# Patient Record
Sex: Male | Born: 1950
Health system: Southern US, Community
[De-identification: ages and names within clinical notes are randomized; demographics above are authoritative.]

## PROBLEM LIST (undated history)

## (undated) DIAGNOSIS — Z9989 Dependence on other enabling machines and devices: Secondary | ICD-10-CM

## (undated) DIAGNOSIS — E785 Hyperlipidemia, unspecified: Secondary | ICD-10-CM

## (undated) DIAGNOSIS — B269 Mumps without complication: Secondary | ICD-10-CM

## (undated) DIAGNOSIS — M1990 Unspecified osteoarthritis, unspecified site: Secondary | ICD-10-CM

## (undated) DIAGNOSIS — H269 Unspecified cataract: Secondary | ICD-10-CM

## (undated) DIAGNOSIS — Z9189 Other specified personal risk factors, not elsewhere classified: Secondary | ICD-10-CM

## (undated) DIAGNOSIS — J309 Allergic rhinitis, unspecified: Secondary | ICD-10-CM

## (undated) DIAGNOSIS — G3184 Mild cognitive impairment, so stated: Secondary | ICD-10-CM

## (undated) DIAGNOSIS — T7840XA Allergy, unspecified, initial encounter: Secondary | ICD-10-CM

## (undated) DIAGNOSIS — E119 Type 2 diabetes mellitus without complications: Secondary | ICD-10-CM

## (undated) DIAGNOSIS — Z8619 Personal history of other infectious and parasitic diseases: Secondary | ICD-10-CM

## (undated) DIAGNOSIS — I10 Essential (primary) hypertension: Secondary | ICD-10-CM

## (undated) HISTORY — PX: WISDOM TOOTH EXTRACTION: SHX21

## (undated) HISTORY — DX: Mumps without complication: B26.9

## (undated) HISTORY — DX: Personal history of other infectious and parasitic diseases: Z86.19

## (undated) HISTORY — DX: Hyperlipidemia, unspecified: E78.5

## (undated) HISTORY — DX: Essential (primary) hypertension: I10

## (undated) HISTORY — DX: Dependence on other enabling machines and devices: Z99.89

## (undated) HISTORY — DX: Allergic rhinitis, unspecified: J30.9

## (undated) HISTORY — DX: Mild cognitive impairment of uncertain or unknown etiology: G31.84

## (undated) HISTORY — DX: Unspecified cataract: H26.9

## (undated) HISTORY — DX: Unspecified osteoarthritis, unspecified site: M19.90

## (undated) HISTORY — DX: Allergy, unspecified, initial encounter: T78.40XA

## (undated) HISTORY — PX: HERNIA REPAIR: SHX51

## (undated) HISTORY — DX: Other specified personal risk factors, not elsewhere classified: Z91.89

---

## 2009-05-26 ENCOUNTER — Emergency Department (HOSPITAL_COMMUNITY): Admission: EM | Admit: 2009-05-26 | Discharge: 2009-05-26 | Payer: Self-pay | Admitting: Emergency Medicine

## 2009-06-16 ENCOUNTER — Emergency Department (HOSPITAL_COMMUNITY): Admission: EM | Admit: 2009-06-16 | Discharge: 2009-06-16 | Payer: Self-pay | Admitting: Emergency Medicine

## 2009-08-29 ENCOUNTER — Encounter: Admission: RE | Admit: 2009-08-29 | Discharge: 2009-08-29 | Payer: Self-pay | Admitting: Family Medicine

## 2009-10-08 ENCOUNTER — Inpatient Hospital Stay (HOSPITAL_COMMUNITY): Admission: EM | Admit: 2009-10-08 | Discharge: 2009-10-18 | Payer: Self-pay | Admitting: Emergency Medicine

## 2009-10-08 DIAGNOSIS — E119 Type 2 diabetes mellitus without complications: Secondary | ICD-10-CM

## 2009-10-08 DIAGNOSIS — N179 Acute kidney failure, unspecified: Secondary | ICD-10-CM

## 2009-10-08 LAB — CONVERTED CEMR LAB: Hgb A1c MFr Bld: 13.3 %

## 2009-10-09 LAB — CONVERTED CEMR LAB
Cholesterol: 228 mg/dL
LDL Cholesterol: 147 mg/dL
Total CHOL/HDL Ratio: 7.6
Triglycerides: 254 mg/dL
VLDL: 51 mg/dL

## 2009-10-10 LAB — CONVERTED CEMR LAB
HCT: 39.7 %
Hemoglobin: 12.7 g/dL
MCHC: 32 g/dL

## 2009-10-12 LAB — CONVERTED CEMR LAB
Calcium: 8.5 mg/dL
Chloride: 108 meq/L
Creatinine, Ser: 1.16 mg/dL
Potassium: 3.4 meq/L

## 2009-11-11 ENCOUNTER — Ambulatory Visit: Payer: Self-pay | Admitting: Nurse Practitioner

## 2009-11-11 DIAGNOSIS — E785 Hyperlipidemia, unspecified: Secondary | ICD-10-CM

## 2009-11-11 DIAGNOSIS — R351 Nocturia: Secondary | ICD-10-CM

## 2009-11-11 DIAGNOSIS — J309 Allergic rhinitis, unspecified: Secondary | ICD-10-CM | POA: Insufficient documentation

## 2009-11-11 DIAGNOSIS — K5900 Constipation, unspecified: Secondary | ICD-10-CM | POA: Insufficient documentation

## 2009-11-11 DIAGNOSIS — N429 Disorder of prostate, unspecified: Secondary | ICD-10-CM | POA: Insufficient documentation

## 2009-11-11 LAB — CONVERTED CEMR LAB
ALT: 30 units/L (ref 0–53)
AST: 21 units/L (ref 0–37)
Alkaline Phosphatase: 68 units/L (ref 39–117)
Cholesterol, target level: 200 mg/dL
Glucose, Bld: 87 mg/dL (ref 70–99)
HDL goal, serum: 40 mg/dL
PSA: 0.81 ng/mL (ref 0.10–4.00)
Total Bilirubin: 0.3 mg/dL (ref 0.3–1.2)
Total Protein: 7 g/dL (ref 6.0–8.3)

## 2009-11-12 ENCOUNTER — Encounter (INDEPENDENT_AMBULATORY_CARE_PROVIDER_SITE_OTHER): Payer: Self-pay | Admitting: Nurse Practitioner

## 2010-03-22 ENCOUNTER — Encounter: Payer: Self-pay | Admitting: Family Medicine

## 2010-03-31 NOTE — Assessment & Plan Note (Signed)
Summary: NEW - Establish Care   Vital Signs:  Patient profile:   60 year old male Height:      69.50 inches Weight:      200.8 pounds BMI:     29.33 Temp:     97.6 degrees F oral Pulse rate:   72 / minute Pulse rhythm:   regular Resp:     16 per minute BP sitting:   138 / 80  (left arm) Cuff size:   regular  Vitals Entered By: Levon Hedger (November 11, 2009 10:18 AM)  Nutrition Counseling: Patient's BMI is greater than 25 and therefore counseled on weight management options. CC: hospital f/u DM, Lipid Management Is Patient Diabetic? Yes Pain Assessment Patient in pain? no      CBG Result 118 CBG Device ID B  Does patient need assistance? Functional Status Self care Ambulation Normal   CC:  hospital f/u DM and Lipid Management.  History of Present Illness:  Pt into the office to establish care. Pt was previously seen at [facility] - Dr. Daphine Deutscher.  Pt did not want to transfer from that practice however he was given an appt in this office for hospital f/u so he came today as scheduled  Hospitalized from 10/08/2009 to 10/13/2009  Pt presented to the ER with nausea, vomiting, abdominal pain, polyuria and polydipsia. Pt was seen by his PCP and his blood sugar was over 700 ad he was told to go to the hospital for further workup.    1. Diabetes - pt as put on an insulin drip per DKA protocol  Sugars improved and dehydration improved.  He was seen by the diabetes coordinator and his insulin was adjusted to achieve good control.  Hbga1c = 13.3.  Pt was started on insulin.  Pt has been taking 70/30 - 15 units subcutaneously two times a day.  2.  ARF - likely from dehydration.  Pt was hydrated and crt and sodium normal at d/c.   Renal ultrasound was negative for hydronephrosis.  3.  Leukocytosis with hydration - resolved at d/c.  he did not have any evidence of infection  4.  Dyslipidemia - pt started on a statin.    Diabetes Management History:      The  patient is a 60 years old male who comes in for evaluation of DM Type 2.  He has not been enrolled in the "Diabetic Education Program".  No sensory loss is reported.  Self foot exams are not being performed.  He is checking home blood sugars.        Hypoglycemic symptoms are not occurring.  No hyperglycemic symptoms are reported.  Other comments include: pt is checking his blood sugars 2-3 times per day and presents today with the blood sugar log.        The following changes have been made to his treatment plan since last visit: insulin dosing.  Treatment plan changes were initiated by MD.    Lipid Management History:      Positive NCEP/ATP III risk factors include male age 63 years old or older, diabetes, and HDL cholesterol less than 40.  Negative NCEP/ATP III risk factors include non-tobacco-user status.        The patient states that he knows about the "Therapeutic Lifestyle Change" diet.  His compliance with the TLC diet is fair.  The patient does not know about adjunctive measures for cholesterol lowering.  Adjunctive measures started by the patient include ASA.  He expresses no  side effects from his lipid-lowering medication.  The patient denies any symptoms to suggest myopathy or liver disease.     Habits & Providers  Alcohol-Tobacco-Diet     Alcohol drinks/day: <1     Alcohol Counseling: not indicated; use of alcohol is not excessive or problematic     Tobacco Status: quit     Tobacco Counseling: to remain off tobacco products     Year Quit: 2011  Exercise-Depression-Behavior     Have you felt down or hopeless? no     Have you felt little pleasure in things? no     Depression Counseling: not indicated; screening negative for depression     Drug Use: past  Allergies (verified): No Known Drug Allergies  Past History:  Past Surgical History: inguinal hernia repair  Family History: mother - diabetes father - unknown medical history brother - ? bone cancer, diabetes  Social  History: 6 children tobacco - quit in 2011 ETOH -  Drug - Smoking Status:  quit Drug Use:  past  Diabetic Foot Exam Foot Inspection Is there a history of a foot ulcer?              No Is there a foot ulcer now?              No Can the patient see the bottom of their feet?          No Are the shoes appropriate in style and fit?          Yes Is there swelling or an abnormal foot shape?          No Are the toenails long?                Yes Are the toenails thick?                Yes Are the toenails ingrown?              No Is there heavy callous build-up?              No Is there pain in the calf muscle (Intermittent claudication) when walking?    NoIs there a claw toe deformity?              No Is there elevated skin temperature?            No Is there limited ankle dorsiflexion?            No Is there foot or ankle muscle weakness?            No  Diabetic Foot Care Education Pulse Check          Right Foot          Left Foot Dorsalis Pedis:        normal            normal    10-g (5.07) Semmes-Weinstein Monofilament Test           Right Foot          Left Foot Visual Inspection                Review of Systems CV:  Denies chest pain or discomfort. Resp:  Denies cough. GI:  Complains of constipation; denies abdominal pain, nausea, and vomiting.  Physical Exam  General:  alert.   Head:  normocephalic.   Lungs:  normal breath sounds.   Heart:  normal rate and regular rhythm.   Abdomen:  normal bowel sounds.   Neurologic:  rigid gait cane use Psych:  Oriented X3.    Diabetes Management Exam:    Foot Exam (with socks and/or shoes not present):       Sensory-Monofilament:          Left foot: normal          Right foot: normal       Nails:          Left foot: thickened          Right foot: thickened   Impression & Recommendations:  Problem # 1:  DIABETES MELLITUS, TYPE II (ICD-250.00) new onset but pt was given diabetes education in the hospital and has really  grasped  he asked very appropriate questions today r/t diet  pneumovax given today His updated medication list for this problem includes:    Aspir-low 81 Mg Tbec (Aspirin) ..... One tablet by mouth daily    Novolog Mix 70/30 70-30 % Susp (Insulin aspart prot & aspart) .Marland KitchenMarland KitchenMarland KitchenMarland Kitchen 15 units subcutaneously two times a day for diabetes  Orders: Capillary Blood Glucose/CBG ([address]) T-Comprehensive Metabolic Panel (60454-09811)  Problem # 2:  DYSLIPIDEMIA (ICD-272.4) continue statin as started in the hospital His updated medication list for this problem includes:    Simvastatin 20 Mg Tabs (Simvastatin) ..... One tablet by mouth nightly for cholesterol  Orders: T-Comprehensive Metabolic Panel ([id number])  Problem # 3:  ALLERGIC RHINITIS (ICD-477.9)  His updated medication list for this problem includes:    Loratadine 10 Mg Tabs (Loratadine) ..... One tablet by mouth daily for allergies  Problem # 4:  RENAL FAILURE, ACUTE (ICD-584.9) will check labs today Orders: T-PSA ([id number]) T-Comprehensive Metabolic Panel (13086-57846)  Problem # 5:  UNSPECIFIED DISORDER OF PROSTATE (ICD-602.9) pt reports ? procedure many years ago on prostate unable to void today in office  Orders: T-PSA (96295-28413)  Problem # 6:  CONSTIPATION (ICD-564.00) reviewed with pt that he needs a high fiber diet His updated medication list for this problem includes:    Miralax Powd (Polyethylene glycol 3350) .Marland KitchenMarland KitchenMarland KitchenMarland Kitchen 17gm mixed with 8 ounces of water daily for stools  Complete Medication List: 1)  Aspir-low 81 Mg Tbec (Aspirin) .... One tablet by mouth daily 2)  Novolog Mix 70/30 70-30 % Susp (Insulin aspart prot & aspart) .Marland Kitchen.. 15 units subcutaneously two times a day for diabetes 3)  Miralax Powd (Polyethylene glycol 3350) .Marland Kitchen.. 17gm mixed with 8 ounces of water daily for stools 4)  Loratadine 10 Mg Tabs (Loratadine) .... One tablet by mouth daily for allergies 5)  Simvastatin 20 Mg Tabs (Simvastatin) .... One tablet  by mouth nightly for cholesterol  Other Orders: Pneumococcal Vaccine (24401) Admin 1st Vaccine (02725) Admin 1st Vaccine [facility]) [phone])  Diabetes Management Assessment/Plan:      The following lipid goals have been established for the patient: Total cholesterol goal of 200; LDL cholesterol goal of 100; HDL cholesterol goal of 40; Triglyceride goal of 150.  His blood pressure goal is < 130/80.    Lipid Assessment/Plan:      Based on NCEP/ATP III, the patient's risk factor category is "history of diabetes".  The patient's lipid goals are as follows: Total cholesterol goal is 200; LDL cholesterol goal is 100; HDL cholesterol goal is 40; Triglyceride goal is 150.  His LDL cholesterol goal has not been met.    Patient Instructions: 1)  Since you are already established at [facility] you can continue to go there if you like. 2)  Diabetes - Continue current medications - Insulin 70/30: 15 units subcutaneously two times a day.  3)  Your labs will be checked today and you will be notified of the results. 4)  You have received your pneumovax today. 5)  If you continue to come to [facility] call to make a follow up appointment in 4 weeks for diabetes. 6)  Flu vaccines should be in then. Prescriptions: MIRALAX  POWD (POLYETHYLENE GLYCOL 3350) 17gm mixed with 8 ounces of water daily for stools  #1 month qs x 1   Entered and Authorized by:   Lehman Prom FNP   Signed by:   Lehman Prom FNP on 11/11/2009   Method used:   Print then Give to Patient   RxID:   [id number]          Diabetic Foot Exam Pulse Check          Right Foot          Left Foot Dorsalis Pedis:        normal            normal    10-g (5.07) Semmes-Weinstein Monofilament Test           Right Foot          Left Foot Visual Inspection               Test Control      normal         normal Site 1         normal         normal Site 2         normal         normal Site 3         normal          normal Site 4         normal         normal Site 5         normal         normal Site 6         normal         normal Site 7         normal         normal Site 8         normal         normal Site 9         normal         normal Site 10         normal         normal  Impression      normal         normal   Laboratory Results   Blood Tests     CBG Random:: 118mg /dL       CXR  Procedure date:  10/08/2009  Findings:      linear left lower lobe probable scarring or atelectasis with no other acute abnormalities  X-ray  Procedure date:  10/08/2009  Findings:      KUB - nonobstructive bowel gas pattern  Renal US  Procedure date:  10/08/2009  Findings:      no hydronephrosis   CXR  Procedure date:  10/08/2009  Findings:      linear left lower lobe probable scarring or atelectasis with no other acute abnormalities  X-ray  Procedure date:  10/08/2009  Findings:      KUB - nonobstructive  bowel gas pattern  Renal US  Procedure date:  10/08/2009  Findings:      no hydronephrosis   Pneumovax Vaccine    Vaccine Type: Pneumovax    Site: right deltoid    Mfr: Merck    Dose: 0.5 ml    Route: IM    Given by: Levon Hedger    Exp. Date: 03/22/2011    Lot #: [id number]    VIS given: 02/03/09 version given November 11, 2009.    NDC  2956-2130-86

## 2010-03-31 NOTE — Letter (Signed)
Summary: *HSN Results Follow up  [facility]  [address] [place], [place] [address]   Phone: [phone]  Fax: [phone]      11/12/2009   Crecencio Mc [address] [place], [place]  [address]   Dear  Mr. RIGO LETTS,                            ____S.Drinkard,FNP   ____D. Gore,FNP       ____B. McPherson,MD   ____V. Rankins,MD    ____E. Mulberry,MD    _X___N. Daphine Deutscher, FNP  ____D. Reche Dixon, MD    ____K. Philipp Deputy, MD    ____Other     This letter is to inform you that your recent test(s):  _______Pap Smear    ___X____Lab Test     _______X-ray    ___X____ is within acceptable limits  _______ requires a medication change  _______ requires a follow-up lab visit  _______ requires a follow-up visit with your provider   Comments: Labs done during recent office visit are normal.       _________________________________________________________ If you have any questions, please contact our office [phone].                    Sincerely,    Lehman Prom FNP [facility]

## 2010-03-31 NOTE — Letter (Signed)
Summary: PT  INFORMATION SHEET  PT  INFORMATION SHEET   Imported By: Arta Bruce 11/11/2009 14:16:25  _____________________________________________________________________  External Attachment:    Type:   Image     Comment:   External Document

## 2010-05-15 LAB — GLUCOSE, CAPILLARY
Glucose-Capillary: 112 mg/dL — ABNORMAL HIGH (ref 70–99)
Glucose-Capillary: 143 mg/dL — ABNORMAL HIGH (ref 70–99)
Glucose-Capillary: 154 mg/dL — ABNORMAL HIGH (ref 70–99)
Glucose-Capillary: 159 mg/dL — ABNORMAL HIGH (ref 70–99)
Glucose-Capillary: 161 mg/dL — ABNORMAL HIGH (ref 70–99)
Glucose-Capillary: 162 mg/dL — ABNORMAL HIGH (ref 70–99)
Glucose-Capillary: 172 mg/dL — ABNORMAL HIGH (ref 70–99)
Glucose-Capillary: 177 mg/dL — ABNORMAL HIGH (ref 70–99)
Glucose-Capillary: 196 mg/dL — ABNORMAL HIGH (ref 70–99)
Glucose-Capillary: 202 mg/dL — ABNORMAL HIGH (ref 70–99)
Glucose-Capillary: 206 mg/dL — ABNORMAL HIGH (ref 70–99)
Glucose-Capillary: 224 mg/dL — ABNORMAL HIGH (ref 70–99)
Glucose-Capillary: 226 mg/dL — ABNORMAL HIGH (ref 70–99)
Glucose-Capillary: 237 mg/dL — ABNORMAL HIGH (ref 70–99)
Glucose-Capillary: 238 mg/dL — ABNORMAL HIGH (ref 70–99)
Glucose-Capillary: 68 mg/dL — ABNORMAL LOW (ref 70–99)
Glucose-Capillary: 98 mg/dL (ref 70–99)
Glucose-Capillary: 109 mg/dL — ABNORMAL HIGH (ref 70–99)
Glucose-Capillary: 127 mg/dL — ABNORMAL HIGH (ref 70–99)
Glucose-Capillary: 158 mg/dL — ABNORMAL HIGH (ref 70–99)
Glucose-Capillary: 169 mg/dL — ABNORMAL HIGH (ref 70–99)
Glucose-Capillary: 227 mg/dL — ABNORMAL HIGH (ref 70–99)
Glucose-Capillary: 327 mg/dL — ABNORMAL HIGH (ref 70–99)
Glucose-Capillary: 329 mg/dL — ABNORMAL HIGH (ref 70–99)
Glucose-Capillary: 417 mg/dL — ABNORMAL HIGH (ref 70–99)
Glucose-Capillary: 576 mg/dL (ref 70–99)
Glucose-Capillary: 76 mg/dL (ref 70–99)
Glucose-Capillary: >600 mg/dL (ref 70–99)
Glucose-Capillary: >600 mg/dL (ref 70–99)
Glucose-Capillary: >600 mg/dL (ref 70–99)
Glucose-Capillary: >600 mg/dL (ref 70–99)

## 2010-05-15 LAB — CBC
HCT: 44 % (ref 39.0–52.0)
HCT: 50.2 % (ref 39.0–52.0)
HCT: 51.8 % (ref 39.0–52.0)
Hemoglobin: 16.6 g/dL (ref 13.0–17.0)
Hemoglobin: 17.2 g/dL — ABNORMAL HIGH (ref 13.0–17.0)
MCH: 27.1 pg (ref 26.0–34.0)
MCH: 27.8 pg (ref 26.0–34.0)
MCH: 28.5 pg (ref 26.0–34.0)
MCHC: 33.1 g/dL (ref 30.0–36.0)
MCHC: 33.2 g/dL (ref 30.0–36.0)
MCV: 84.8 fL (ref 78.0–100.0)
MCV: 85.3 fL (ref 78.0–100.0)
MCV: 85.9 fL (ref 78.0–100.0)
Platelets: 123 10*3/uL — ABNORMAL LOW (ref 150–400)
Platelets: 175 K/uL (ref 150–400)
RBC: 5.16 MIL/uL (ref 4.22–5.81)
RBC: 5.97 MIL/uL — ABNORMAL HIGH (ref 4.22–5.81)
RBC: 6.03 MIL/uL — ABNORMAL HIGH (ref 4.22–5.81)
RDW: 13.5 % (ref 11.5–15.5)
RDW: 13.6 % (ref 11.5–15.5)
RDW: 13.7 % (ref 11.5–15.5)
WBC: 15.3 K/uL — ABNORMAL HIGH (ref 4.0–10.5)
WBC: 7 10*3/uL (ref 4.0–10.5)
WBC: 8.7 10*3/uL (ref 4.0–10.5)

## 2010-05-15 LAB — BLOOD GAS, ARTERIAL
Acid-Base Excess: 1.6 mmol/L (ref 0.0–2.0)
Bicarbonate: 25.9 meq/L — ABNORMAL HIGH (ref 20.0–24.0)
Drawn by: 311371
O2 Content: 0.2 L/min
O2 Saturation: 93.5 %
Patient temperature: 98.2
TCO2: 27.2 mmol/L (ref 0–100)
pCO2 arterial: 42.2 mmHg (ref 35.0–45.0)
pH, Arterial: 7.404 (ref 7.350–7.450)
pO2, Arterial: 67.2 mmHg — ABNORMAL LOW (ref 80.0–100.0)

## 2010-05-15 LAB — BASIC METABOLIC PANEL
BUN: 103 mg/dL — ABNORMAL HIGH (ref 6–23)
BUN: 19 mg/dL (ref 6–23)
BUN: 32 mg/dL — ABNORMAL HIGH (ref 6–23)
BUN: 46 mg/dL — ABNORMAL HIGH (ref 6–23)
BUN: 66 mg/dL — ABNORMAL HIGH (ref 6–23)
CO2: 17 mEq/L — ABNORMAL LOW (ref 19–32)
CO2: 23 mEq/L (ref 19–32)
CO2: 27 mEq/L (ref 19–32)
CO2: 28 mEq/L (ref 19–32)
Calcium: 9 mg/dL (ref 8.4–10.5)
Chloride: 108 mEq/L (ref 96–112)
Chloride: 109 mEq/L (ref 96–112)
Chloride: 118 mEq/L — ABNORMAL HIGH (ref 96–112)
Chloride: 119 mEq/L — ABNORMAL HIGH (ref 96–112)
Chloride: 94 mEq/L — ABNORMAL LOW (ref 96–112)
Creatinine, Ser: 1.16 mg/dL (ref 0.4–1.5)
Creatinine, Ser: 1.34 mg/dL (ref 0.4–1.5)
Creatinine, Ser: 2.28 mg/dL — ABNORMAL HIGH (ref 0.4–1.5)
GFR calc Af Amer: 54 mL/min — ABNORMAL LOW (ref >60)
GFR calc non Af Amer: 16 mL/min — ABNORMAL LOW (ref >60)
GFR calc non Af Amer: 44 mL/min — ABNORMAL LOW (ref >60)
Glucose, Bld: 240 mg/dL — ABNORMAL HIGH (ref 70–99)
Glucose, Bld: 371 mg/dL — ABNORMAL HIGH (ref 70–99)
Glucose, Bld: 940 mg/dL (ref 70–99)
Potassium: 3.8 mEq/L (ref 3.5–5.1)
Potassium: 3.9 mEq/L (ref 3.5–5.1)
Potassium: 5.2 mEq/L — ABNORMAL HIGH (ref 3.5–5.1)
Sodium: 136 mEq/L (ref 135–145)
Sodium: 154 mEq/L — ABNORMAL HIGH (ref 135–145)

## 2010-05-15 LAB — RAPID URINE DRUG SCREEN, HOSP PERFORMED
Barbiturates: NOT DETECTED
Benzodiazepines: NOT DETECTED
Cocaine: NOT DETECTED

## 2010-05-15 LAB — DIFFERENTIAL
Basophils Absolute: 0 10*3/uL (ref 0.0–0.1)
Basophils Absolute: 0 10*3/uL (ref 0.0–0.1)
Basophils Absolute: 0 K/uL (ref 0.0–0.1)
Basophils Relative: 0 % (ref 0–1)
Eosinophils Absolute: 0 10*3/uL (ref 0.0–0.7)
Eosinophils Absolute: 0 K/uL (ref 0.0–0.7)
Eosinophils Absolute: 0.1 10*3/uL (ref 0.0–0.7)
Eosinophils Relative: 0 % (ref 0–5)
Eosinophils Relative: 0 % (ref 0–5)
Eosinophils Relative: 1 % (ref 0–5)
Lymphocytes Relative: 17 % (ref 12–46)
Lymphocytes Relative: 21 % (ref 12–46)
Lymphs Abs: 2.5 10*3/uL (ref 0.7–4.0)
Lymphs Abs: 2.7 K/uL (ref 0.7–4.0)
Monocytes Absolute: 1 K/uL (ref 0.1–1.0)
Monocytes Relative: 7 % (ref 3–12)
Neutro Abs: 11.6 K/uL — ABNORMAL HIGH (ref 1.7–7.7)
Neutrophils Relative %: 45 % (ref 43–77)
Neutrophils Relative %: 68 % (ref 43–77)
Neutrophils Relative %: 76 % (ref 43–77)

## 2010-05-15 LAB — TSH: TSH: 0.479 u[IU]/mL (ref 0.350–4.500)

## 2010-05-15 LAB — URINE MICROSCOPIC-ADD ON

## 2010-05-15 LAB — POCT I-STAT, CHEM 8
BUN: 104 mg/dL — ABNORMAL HIGH (ref 6–23)
Calcium, Ion: 1.09 mmol/L — ABNORMAL LOW (ref 1.12–1.32)
Creatinine, Ser: 3.7 mg/dL — ABNORMAL HIGH (ref 0.4–1.5)
Hemoglobin: 18.7 g/dL — ABNORMAL HIGH (ref 13.0–17.0)
Sodium: 142 mEq/L (ref 135–145)
TCO2: 19 mmol/L (ref 0–100)

## 2010-05-15 LAB — URINALYSIS, ROUTINE W REFLEX MICROSCOPIC
Bilirubin Urine: NEGATIVE
Glucose, UA: >1000 mg/dL — AB
Hgb urine dipstick: NEGATIVE
Ketones, ur: 15 mg/dL — AB
Leukocytes, UA: NEGATIVE
Nitrite: NEGATIVE
Protein, ur: NEGATIVE mg/dL
Specific Gravity, Urine: 1.034 — ABNORMAL HIGH (ref 1.005–1.030)
Urobilinogen, UA: 0.2 mg/dL (ref 0.0–1.0)
pH: 5 (ref 5.0–8.0)

## 2010-05-15 LAB — LIPID PANEL
Triglycerides: 254 mg/dL — ABNORMAL HIGH (ref <150)
VLDL: 51 mg/dL — ABNORMAL HIGH (ref 0–40)

## 2010-05-15 LAB — COMPREHENSIVE METABOLIC PANEL
ALT: 26 U/L (ref 0–53)
CO2: 24 mEq/L (ref 19–32)
Calcium: 9.3 mg/dL (ref 8.4–10.5)
Chloride: 118 mEq/L — ABNORMAL HIGH (ref 96–112)
Creatinine, Ser: 2.49 mg/dL — ABNORMAL HIGH (ref 0.4–1.5)
GFR calc non Af Amer: 27 mL/min — ABNORMAL LOW (ref >60)
Glucose, Bld: 192 mg/dL — ABNORMAL HIGH (ref 70–99)
Total Bilirubin: 0.7 mg/dL (ref 0.3–1.2)

## 2010-05-15 LAB — HEMOGLOBIN A1C
Hgb A1c MFr Bld: 13.3 % — ABNORMAL HIGH (ref <5.7)
Mean Plasma Glucose: 335 mg/dL — ABNORMAL HIGH (ref <117)

## 2010-05-15 LAB — MRSA PCR SCREENING: MRSA by PCR: NEGATIVE

## 2010-05-25 LAB — URINALYSIS, ROUTINE W REFLEX MICROSCOPIC
Glucose, UA: NEGATIVE mg/dL
Protein, ur: NEGATIVE mg/dL
Specific Gravity, Urine: 1.026 (ref 1.005–1.030)
pH: 5 (ref 5.0–8.0)

## 2010-05-25 LAB — DIFFERENTIAL
Eosinophils Relative: 1 % (ref 0–5)
Lymphocytes Relative: 26 % (ref 12–46)
Lymphs Abs: 3.1 10*3/uL (ref 0.7–4.0)

## 2010-05-25 LAB — CBC
HCT: 46 % (ref 39.0–52.0)
Hemoglobin: 15.4 g/dL (ref 13.0–17.0)
Platelets: 194 10*3/uL (ref 150–400)
RBC: 5.36 MIL/uL (ref 4.22–5.81)
WBC: 11.6 10*3/uL — ABNORMAL HIGH (ref 4.0–10.5)

## 2010-05-25 LAB — POCT I-STAT, CHEM 8
BUN: 16 mg/dL (ref 6–23)
Calcium, Ion: 1.17 mmol/L (ref 1.12–1.32)
Chloride: 104 mEq/L (ref 96–112)
Sodium: 139 mEq/L (ref 135–145)

## 2010-05-25 LAB — LIPASE, BLOOD: Lipase: 18 U/L (ref 11–59)

## 2010-10-07 IMAGING — US US RENAL
1 series · 14 of 25 positions shown · non-contrast
Comparison: None.

CLINICAL DATA: Acute renal failure.  Assess for hydronephrosis.

RENAL/URINARY TRACT ULTRASOUND COMPLETE

[Series 1: us renal · 0.28mm/px · 14 of 29 slices shown]
[im 1/29]
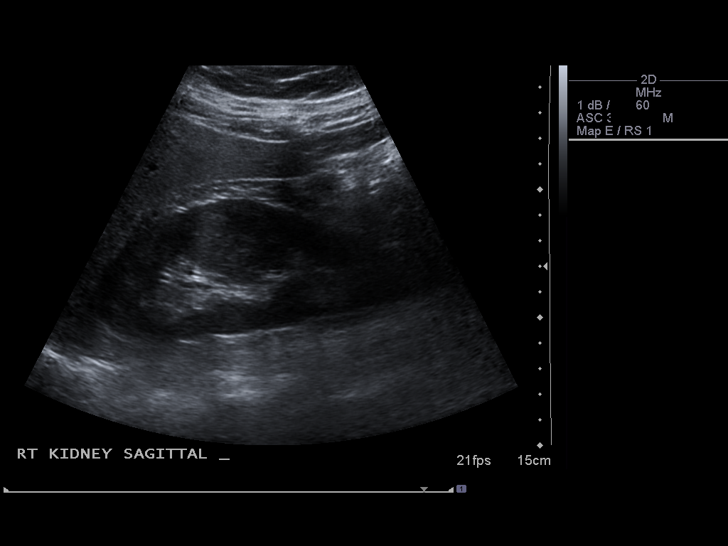
[im 3/29]
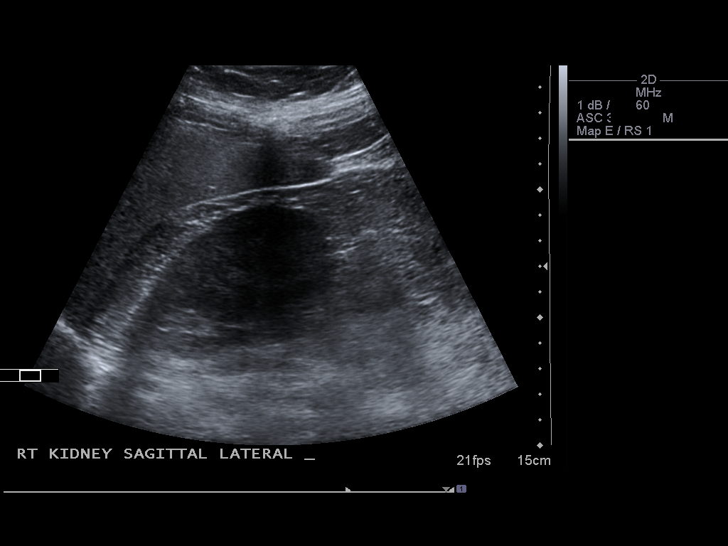
[im 5/29]
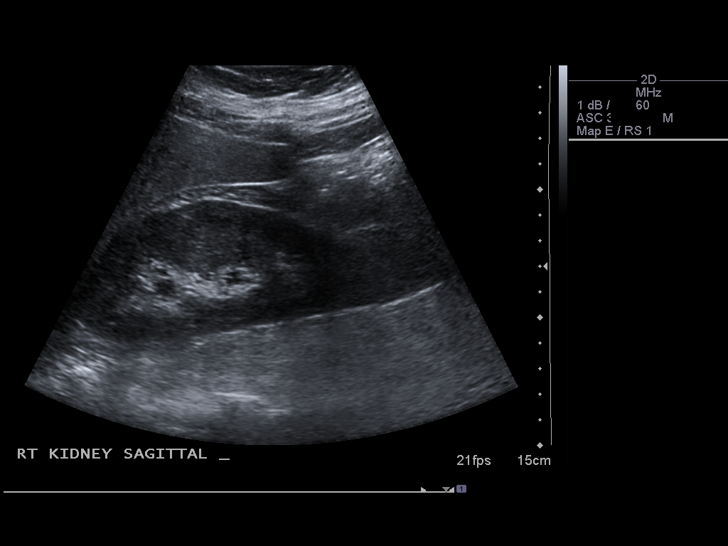
[im 8/29]
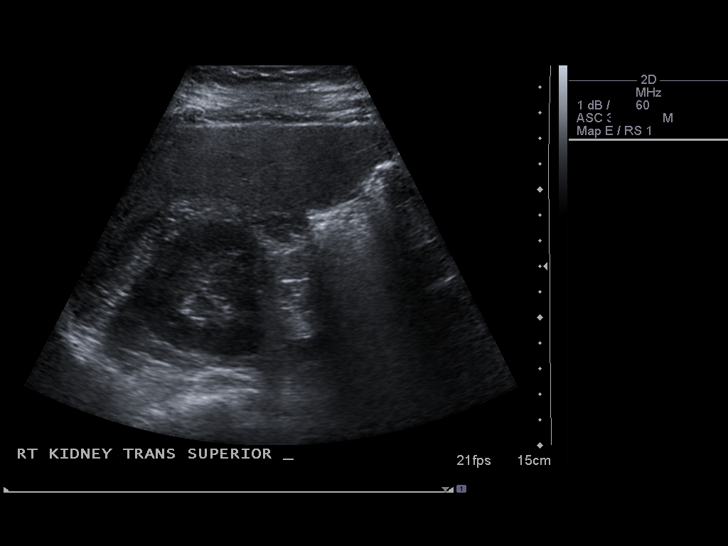
[im 10/29]
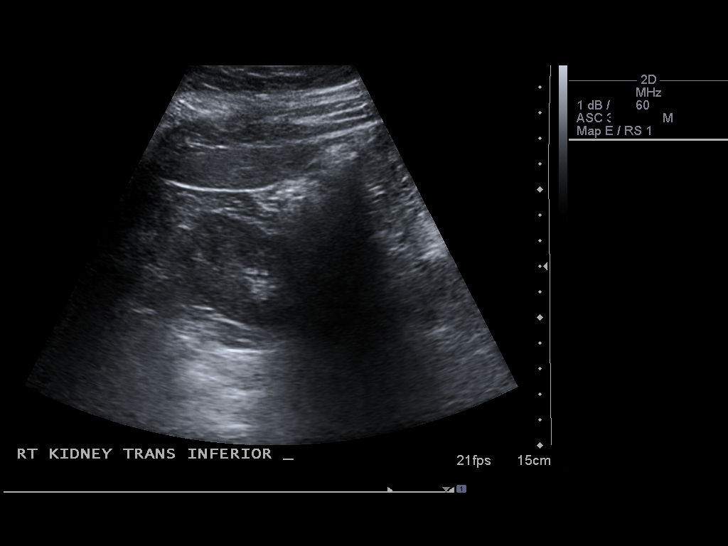
[im 11/29]
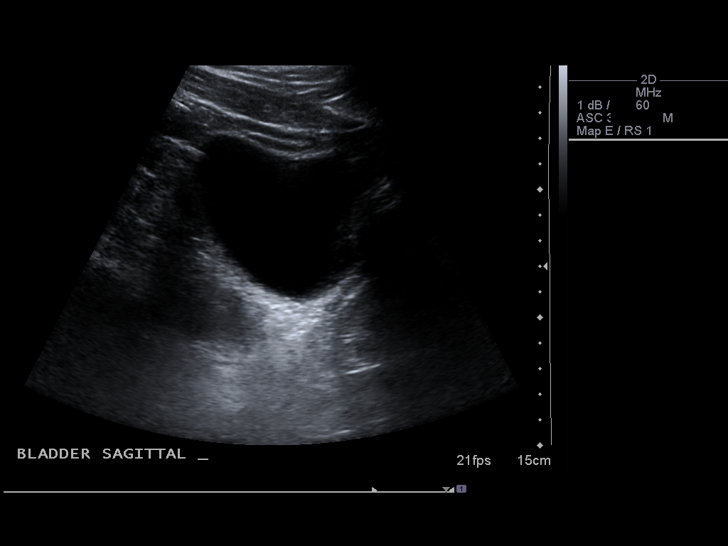
[im 13/29]
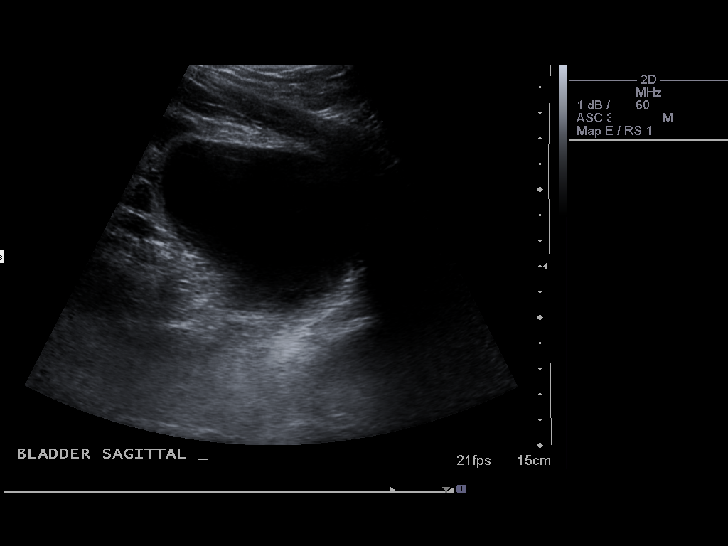
[im 16/29]
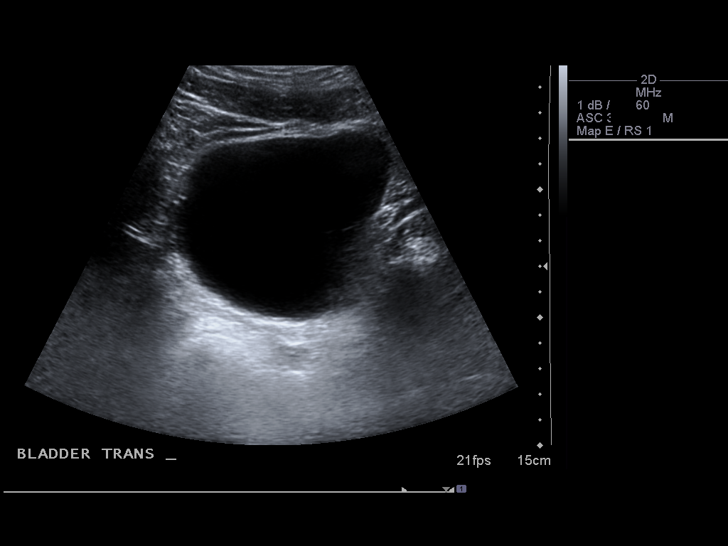
[im 18/29]
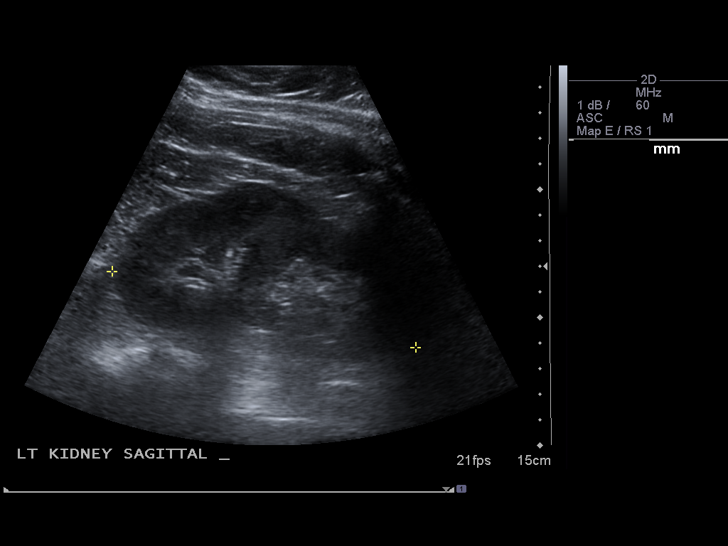
[im 19/29]
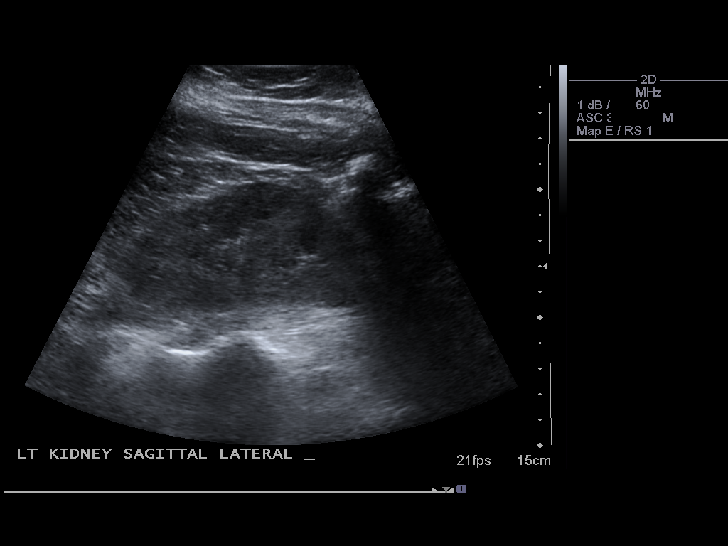
[im 22/29]
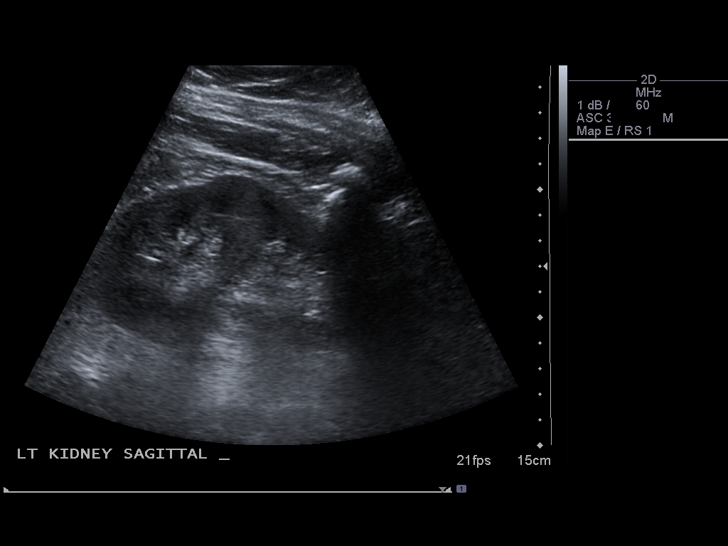
[im 24/29]
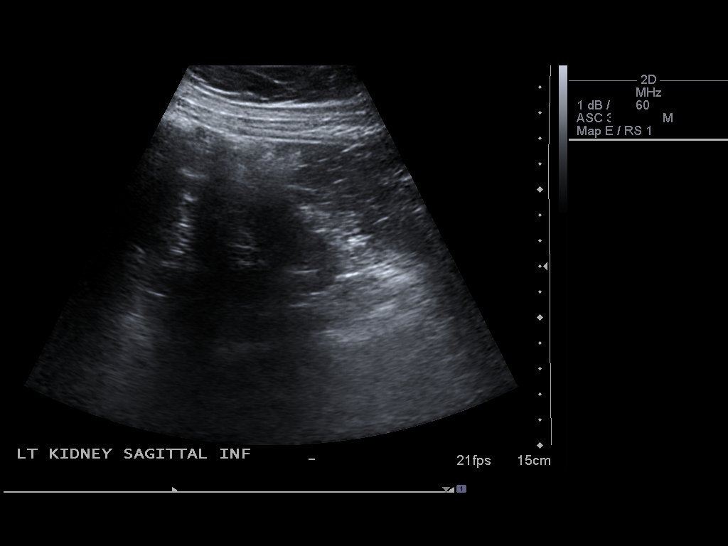
[im 26/29]
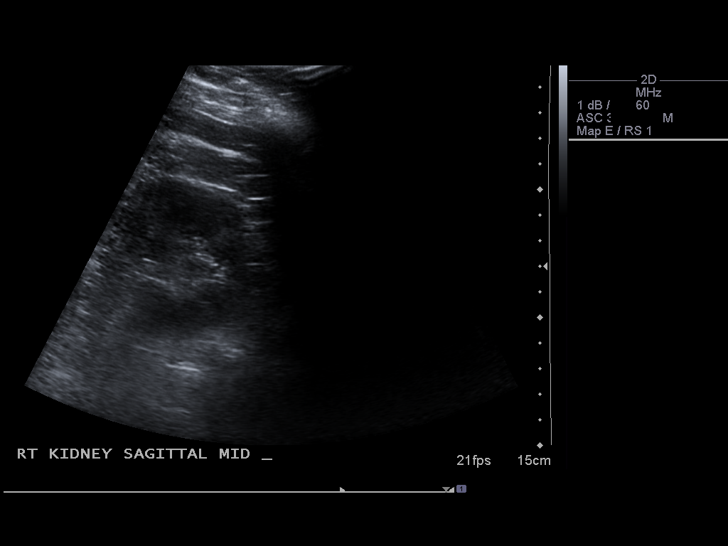
[im 29/29]
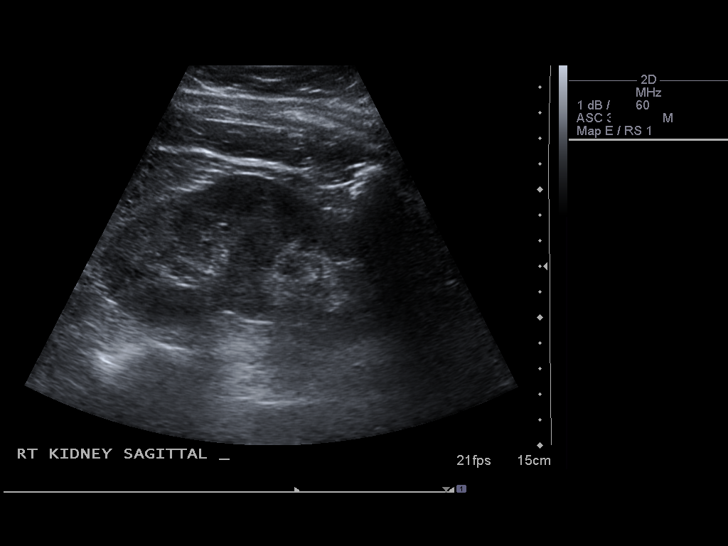

[14 of 25 positions shown; findings below may reference images not displayed]

FINDINGS: Right Kidney:  12.1 cm.  No hydronephrosis or renal mass.

Left Kidney:  12.3 cm. No hydronephrosis or renal mass.

Bladder:  No obvious mass.
IMPRESSION: No hydronephrosis.

## 2013-06-26 ENCOUNTER — Emergency Department (HOSPITAL_COMMUNITY)
Admission: EM | Admit: 2013-06-26 | Discharge: 2013-06-26 | Disposition: A | Discharge status: Home / Self Care | Payer: Medicaid Other | Attending: Emergency Medicine | Admitting: Emergency Medicine

## 2013-06-26 ENCOUNTER — Emergency Department (HOSPITAL_COMMUNITY): Payer: Medicaid Other

## 2013-06-26 DIAGNOSIS — Z794 Long term (current) use of insulin: Secondary | ICD-10-CM | POA: Insufficient documentation

## 2013-06-26 DIAGNOSIS — N200 Calculus of kidney: Secondary | ICD-10-CM

## 2013-06-26 DIAGNOSIS — Z791 Long term (current) use of non-steroidal anti-inflammatories (NSAID): Secondary | ICD-10-CM | POA: Insufficient documentation

## 2013-06-26 DIAGNOSIS — Z79899 Other long term (current) drug therapy: Secondary | ICD-10-CM | POA: Insufficient documentation

## 2013-06-26 DIAGNOSIS — R112 Nausea with vomiting, unspecified: Secondary | ICD-10-CM | POA: Insufficient documentation

## 2013-06-26 LAB — CBC WITH DIFFERENTIAL/PLATELET
BASOS ABS: 0 10*3/uL (ref 0.0–0.1)
BASOS PCT: 0 % (ref 0–1)
EOS ABS: 0 10*3/uL (ref 0.0–0.7)
Eosinophils Relative: 0 % (ref 0–5)
HCT: 41.1 % (ref 39.0–52.0)
HEMOGLOBIN: 14 g/dL (ref 13.0–17.0)
Lymphocytes Relative: 10 % — ABNORMAL LOW (ref 12–46)
Lymphs Abs: 1.4 10*3/uL (ref 0.7–4.0)
MCH: 28.3 pg (ref 26.0–34.0)
MCHC: 34.1 g/dL (ref 30.0–36.0)
MCV: 83 fL (ref 78.0–100.0)
Monocytes Absolute: 0.8 10*3/uL (ref 0.1–1.0)
Monocytes Relative: 6 % (ref 3–12)
NEUTROS ABS: 11.3 10*3/uL — AB (ref 1.7–7.7)
NEUTROS PCT: 84 % — AB (ref 43–77)
PLATELETS: 173 10*3/uL (ref 150–400)
RBC: 4.95 MIL/uL (ref 4.22–5.81)
RDW: 14.3 % (ref 11.5–15.5)
WBC: 13.5 10*3/uL — ABNORMAL HIGH (ref 4.0–10.5)

## 2013-06-26 LAB — COMPREHENSIVE METABOLIC PANEL
ALBUMIN: 3.6 g/dL (ref 3.5–5.2)
ALK PHOS: 70 U/L (ref 39–117)
ALT: 14 U/L (ref 0–53)
AST: 21 U/L (ref 0–37)
BILIRUBIN TOTAL: 0.4 mg/dL (ref 0.3–1.2)
BUN: 16 mg/dL (ref 6–23)
CHLORIDE: 100 meq/L (ref 96–112)
CO2: 21 mEq/L (ref 19–32)
Calcium: 9.9 mg/dL (ref 8.4–10.5)
Creatinine, Ser: 1.46 mg/dL — ABNORMAL HIGH (ref 0.50–1.35)
GFR calc Af Amer: 57 mL/min — ABNORMAL LOW (ref >90)
GFR calc non Af Amer: 49 mL/min — ABNORMAL LOW (ref >90)
Glucose, Bld: 155 mg/dL — ABNORMAL HIGH (ref 70–99)
POTASSIUM: 4.3 meq/L (ref 3.7–5.3)
SODIUM: 137 meq/L (ref 137–147)
TOTAL PROTEIN: 7.4 g/dL (ref 6.0–8.3)

## 2013-06-26 LAB — LIPASE, BLOOD: LIPASE: 24 U/L (ref 11–59)

## 2013-06-26 MED ORDER — HYDROMORPHONE HCL PF 1 MG/ML IJ SOLN
1.0000 mg | Freq: Once | INTRAMUSCULAR | Status: AC
  Administered 2013-06-26: 1 mg via INTRAVENOUS
  Filled 2013-06-26: qty 1

## 2013-06-26 MED ORDER — KETOROLAC TROMETHAMINE 30 MG/ML IJ SOLN
30.0000 mg | Freq: Once | INTRAMUSCULAR | Status: AC
  Administered 2013-06-26: 30 mg via INTRAVENOUS
  Filled 2013-06-26: qty 1

## 2013-06-26 MED ORDER — ONDANSETRON HCL 4 MG/2ML IJ SOLN
4.0000 mg | Freq: Once | INTRAMUSCULAR | Status: AC
  Administered 2013-06-26: 4 mg via INTRAVENOUS
  Filled 2013-06-26: qty 2

## 2013-06-26 MED ORDER — ONDANSETRON HCL 4 MG PO TABS: 4.0000 mg | ORAL_TABLET | Freq: Four times a day (QID) | ORAL | Status: DC

## 2013-06-26 MED ORDER — OXYCODONE-ACETAMINOPHEN 5-325 MG PO TABS: 1.0000 | ORAL_TABLET | ORAL | Status: DC | PRN

## 2013-06-26 MED ORDER — HYDROMORPHONE HCL PF 1 MG/ML IJ SOLN
0.5000 mg | Freq: Once | INTRAMUSCULAR | Status: AC
  Administered 2013-06-26: 0.5 mg via INTRAVENOUS
  Filled 2013-06-26: qty 1

## 2013-06-26 MED ORDER — IOHEXOL 300 MG/ML  SOLN
50.0000 mL | Freq: Once | INTRAMUSCULAR | Status: AC | PRN
  Administered 2013-06-26: 50 mL via ORAL

## 2013-06-26 MED ORDER — IOHEXOL 300 MG/ML  SOLN
100.0000 mL | Freq: Once | INTRAMUSCULAR | Status: AC | PRN
  Administered 2013-06-26: 100 mL via INTRAVENOUS

## 2013-06-26 NOTE — Progress Notes (Signed)
  CARE MANAGEMENT ED NOTE 06/26/2013  Patient:  Phillip Richard, Phillip Richard   Account Number:  [id number]  Date Initiated:  06/26/2013  Documentation initiated by:  Livia Snellen  Subjective/Objective Assessment:   Patient perwsents to ED with right sided abdominal pain.     Subjective/Objective Assessment Detail:     Action/Plan:   Action/Plan Detail:   Anticipated DC Date:       Status Recommendation to Physician:   Result of Recommendation:    Other ED [place]  Other  PCP issues    Choice offered to / List presented to:            Status of service:  Completed, signed off  ED Comments:   ED Comments Detail:  EDCM spoke to patient at bedside.  Patient confirms his oco is Dr. Larina Earthly at the [facility]. System updated.

## 2013-06-26 NOTE — ED Notes (Addendum)
Per EMS patient c/o sudden onset right sided abdominal pain starting last night at 2300, had nausea and emesis of yellow bile. EMS administered 4 mg Zofran. Patient has Hx of HTN, has not had medications today.

## 2013-06-26 NOTE — ED Provider Notes (Signed)
Medical screening examination/treatment/procedure(s) were performed by non-physician practitioner and as supervising physician I was immediately available for consultation/collaboration.   EKG Interpretation None       Threasa Beards, MD 06/26/13 2001

## 2013-06-26 NOTE — ED Provider Notes (Signed)
CSN: [id number]     Arrival date & time 06/26/13  1636 History   First MD Initiated Contact with Patient 06/26/13 1644     Chief Complaint  Patient presents with  . Abdominal Pain  . Emesis     (Consider location/radiation/quality/duration/timing/severity/associated sxs/prior Treatment) Patient is a 63 y.o. male presenting with abdominal pain and vomiting. The history is provided by the patient. No language interpreter was used.  Abdominal Pain Pain location:  RLQ Pain quality: aching   Pain radiates to:  R flank Pain severity:  Moderate Associated symptoms: nausea and vomiting   Associated symptoms: no diarrhea, no dysuria, no fever and no hematuria   Associated symptoms comment:  Right flank and RLQ abdominal pain that started last night. He has had nausea with vomiting last night, less today. No known fever. He denies bowel movement changes. No dysuria or hematuria.  Emesis Associated symptoms: abdominal pain   Associated symptoms: no diarrhea     No past medical history on file. No past surgical history on file. No family history on file. History  Substance Use Topics  . Smoking status: Not on file  . Smokeless tobacco: Not on file  . Alcohol Use: Not on file    Review of Systems  Constitutional: Negative for fever.  Respiratory: Negative.   Cardiovascular: Negative.   Gastrointestinal: Positive for nausea, vomiting and abdominal pain. Negative for diarrhea.  Genitourinary: Positive for flank pain. Negative for dysuria and hematuria.      Allergies  Review of patient's allergies indicates not on file.  Home Medications   Prior to Admission medications   Medication Sig Start Date End Date Taking? Authorizing Provider  Insulin Aspart Prot & Aspart (NOVOLOG 70/30 MIX) (70-30) 100 UNIT/ML Pen Inject 10 Units into the skin 2 (two) times daily.   Yes Historical Provider, MD  lisinopril (PRINIVIL,ZESTRIL) 2.5 MG tablet Take 2.5 mg by mouth daily.   Yes Historical  Provider, MD  loratadine (CLARITIN) 10 MG tablet Take 10 mg by mouth daily.   Yes Historical Provider, MD  metFORMIN (GLUCOPHAGE) 500 MG tablet Take by mouth daily.   Yes Historical Provider, MD  naproxen (NAPROSYN) 375 MG tablet Take 375 mg by mouth daily.   Yes Historical Provider, MD  Olopatadine HCl (PATADAY) 0.2 % SOLN Apply 1 drop to eye as needed (eye irritation). 1 drop to each eye   Yes Historical Provider, MD   BP 175/76  Pulse 56  Temp(Src) 98.8 F (37.1 C) (Oral)  Resp 20  SpO2 97% Physical Exam  Constitutional: He is oriented to person, place, and time. He appears well-developed and well-nourished.  HENT:  Head: Normocephalic.  Neck: Normal range of motion. Neck supple.  Cardiovascular: Normal rate and regular rhythm.   Pulmonary/Chest: Effort normal and breath sounds normal.  Abdominal: Soft. Bowel sounds are normal. There is tenderness. There is no rebound and no guarding.  RLQ and right lateral wall tenderness without guarding.   Genitourinary:  No CVA tenderness.   Musculoskeletal: Normal range of motion.  Neurological: He is alert and oriented to person, place, and time.  Skin: Skin is warm and dry. No rash noted.  Psychiatric: He has a normal mood and affect.    ED Course  Procedures (including critical care time) Labs Review Labs Reviewed  CBC WITH DIFFERENTIAL - Abnormal; Notable for the following:    WBC 13.5 (*)    Neutrophils Relative % 84 (*)    Neutro Abs 11.3 (*)  Lymphocytes Relative 10 (*)    All other components within normal limits  COMPREHENSIVE METABOLIC PANEL - Abnormal; Notable for the following:    Glucose, Bld 155 (*)    Creatinine, Ser 1.46 (*)    GFR calc non Af Amer 49 (*)    GFR calc Af Amer 57 (*)    All other components within normal limits  LIPASE, BLOOD  URINALYSIS, ROUTINE W REFLEX MICROSCOPIC    Imaging Review Ct Abdomen Pelvis W Contrast  06/26/2013   CLINICAL DATA:  Right flank pain.  Vomiting.  EXAM: CT ABDOMEN AND  PELVIS WITH CONTRAST  TECHNIQUE: Multidetector CT imaging of the abdomen and pelvis was performed using the standard protocol following bolus administration of intravenous contrast.  CONTRAST:  75mL OMNIPAQUE IOHEXOL 300 MG/ML SOLN, 124mL OMNIPAQUE IOHEXOL 300 MG/ML SOLN  COMPARISON:  09/16/2004  FINDINGS: Moderate right hydronephrosis is seen as well as perinephric stranding. A tiny 1-2 mm proximal right ureteral calculus is best seen on the coronal MPR (image 59 of series 4). Distal right ureter is nondilated. The tiny right renal cysts are noted however there is no evidence of renal masses.  The liver is normal in appearance except for a tiny subcentimeter cyst. No liver masses are identified. The gallbladder, pancreas, spleen, and right adrenal gland are normal in appearance. Two small masses are seen involving the left adrenal gland which are stable since 2006 exam, consistent with benign adrenal adenomas. No other abdominal soft tissue masses or lymphadenopathy identified.  Diverticulosis is seen involving the left colon, however there is no evidence of diverticulitis. No other inflammatory process or abnormal fluid collections seen. A smoothly marginated low-attenuation structure is seen in the right inguinal canal measuring 2.5 x 3.0 cm. This is also unchanged compared to previous exam, and could represent an undescended testicle or encysted hydrocele. A small periumbilical hernia is also seen containing only fat.  IMPRESSION: Moderate right hydronephrosis and perinephric stranding due to tiny 1-2 mm proximal right ureteral calculus.  Stable 3 cm low-attenuation lesion in right inguinal canal, which could represent an undescended testicle or encysted hydrocele. Scrotal and inguinal canal ultrasound recommended for further evaluation.  Stable benign left adrenal adenomas.  Diverticulosis. No radiographic evidence of diverticulitis.  Small paraumbilical ventral hernia containing only fat.   Electronically  Signed   By: Earle Gell M.D.   On: 06/26/2013 18:56     EKG Interpretation None      MDM   Final diagnoses:  None    1. Ureteral stone, right  Pain is well controlled in ED. No vomiting or fever. Delay in obtaining urine, however, no evidence of infection. He is ready for discharge and will refer to urology.    Dewaine Oats, PA-C 06/26/13 1955

## 2013-06-26 NOTE — ED Notes (Signed)
Patient transported to CT 

## 2013-06-26 NOTE — Discharge Instructions (Signed)
Kidney Stones Kidney stones (urolithiasis) are solid masses that form inside your kidneys. The intense pain is caused by the stone moving through the kidney, ureter, bladder, and urethra (urinary tract). When the stone moves, the ureter starts to spasm around the stone. The stone is usually passed in your pee (urine).  HOME CARE  Drink enough fluids to keep your pee clear or pale yellow. This helps to get the stone out.  Strain all pee through the provided strainer. Do not pee without peeing through the strainer, not even once. If you pee the stone out, catch it in the strainer. The stone may be as small as a grain of salt. Take this to your doctor. This will help your doctor figure out what you can do to try to prevent more kidney stones.  Only take medicine as told by your doctor.  Follow up with your doctor as told.  Get follow-up X-rays as told by your doctor. GET HELP IF: You have pain that gets worse even if you have been taking pain medicine. GET HELP RIGHT AWAY IF:   Your pain does not get better with medicine.  You have a fever or shaking chills.  Your pain increases and gets worse over 18 hours.  You have new belly (abdominal) pain.  You feel faint or pass out.  You are unable to pee. MAKE SURE YOU:   Understand these instructions.  Will watch your condition.  Will get help right away if you are not doing well or get worse. Document Released: 08/04/2007 Document Revised: 10/18/2012 Document Reviewed: 07/19/2012 [employer] Patient Information 2014 [employer].  

## 2014-03-29 DIAGNOSIS — E119 Type 2 diabetes mellitus without complications: Secondary | ICD-10-CM | POA: Diagnosis not present

## 2014-03-29 DIAGNOSIS — I10 Essential (primary) hypertension: Secondary | ICD-10-CM | POA: Diagnosis not present

## 2014-03-29 DIAGNOSIS — N521 Erectile dysfunction due to diseases classified elsewhere: Secondary | ICD-10-CM | POA: Diagnosis not present

## 2014-08-02 DIAGNOSIS — N521 Erectile dysfunction due to diseases classified elsewhere: Secondary | ICD-10-CM | POA: Diagnosis not present

## 2014-08-02 DIAGNOSIS — I10 Essential (primary) hypertension: Secondary | ICD-10-CM | POA: Diagnosis not present

## 2014-08-02 DIAGNOSIS — E119 Type 2 diabetes mellitus without complications: Secondary | ICD-10-CM | POA: Diagnosis not present

## 2015-01-04 ENCOUNTER — Emergency Department (HOSPITAL_BASED_OUTPATIENT_CLINIC_OR_DEPARTMENT_OTHER)
Admission: EM | Admit: 2015-01-04 | Discharge: 2015-01-04 | Disposition: A | Discharge status: Home / Self Care | Payer: Medicare Other | Attending: Emergency Medicine | Admitting: Emergency Medicine

## 2015-01-04 ENCOUNTER — Encounter (HOSPITAL_BASED_OUTPATIENT_CLINIC_OR_DEPARTMENT_OTHER): Payer: Self-pay | Admitting: *Deleted

## 2015-01-04 DIAGNOSIS — E119 Type 2 diabetes mellitus without complications: Secondary | ICD-10-CM | POA: Insufficient documentation

## 2015-01-04 DIAGNOSIS — F1210 Cannabis abuse, uncomplicated: Secondary | ICD-10-CM | POA: Insufficient documentation

## 2015-01-04 DIAGNOSIS — Z794 Long term (current) use of insulin: Secondary | ICD-10-CM | POA: Diagnosis not present

## 2015-01-04 DIAGNOSIS — Z008 Encounter for other general examination: Secondary | ICD-10-CM | POA: Diagnosis present

## 2015-01-04 DIAGNOSIS — F1410 Cocaine abuse, uncomplicated: Secondary | ICD-10-CM | POA: Insufficient documentation

## 2015-01-04 DIAGNOSIS — Z720 Tobacco use: Secondary | ICD-10-CM | POA: Diagnosis not present

## 2015-01-04 DIAGNOSIS — Z79899 Other long term (current) drug therapy: Secondary | ICD-10-CM | POA: Insufficient documentation

## 2015-01-04 DIAGNOSIS — F1010 Alcohol abuse, uncomplicated: Secondary | ICD-10-CM | POA: Insufficient documentation

## 2015-01-04 DIAGNOSIS — Z7984 Long term (current) use of oral hypoglycemic drugs: Secondary | ICD-10-CM | POA: Insufficient documentation

## 2015-01-04 DIAGNOSIS — F1910 Other psychoactive substance abuse, uncomplicated: Secondary | ICD-10-CM

## 2015-01-04 HISTORY — DX: Type 2 diabetes mellitus without complications: E11.9

## 2015-01-04 LAB — COMPREHENSIVE METABOLIC PANEL
ALBUMIN: 3.6 g/dL (ref 3.5–5.0)
ALK PHOS: 62 U/L (ref 38–126)
ALT: 17 U/L (ref 17–63)
AST: 19 U/L (ref 15–41)
Anion gap: 6 (ref 5–15)
BUN: 19 mg/dL (ref 6–20)
CALCIUM: 9.1 mg/dL (ref 8.9–10.3)
CO2: 27 mmol/L (ref 22–32)
CREATININE: 1.12 mg/dL (ref 0.61–1.24)
Chloride: 111 mmol/L (ref 101–111)
GFR calc non Af Amer: >60 mL/min (ref >60)
GLUCOSE: 141 mg/dL — AB (ref 65–99)
Potassium: 4.7 mmol/L (ref 3.5–5.1)
SODIUM: 144 mmol/L (ref 135–145)
Total Bilirubin: 0.4 mg/dL (ref 0.3–1.2)
Total Protein: 7 g/dL (ref 6.5–8.1)

## 2015-01-04 LAB — CBC WITH DIFFERENTIAL/PLATELET
Basophils Absolute: 0 10*3/uL (ref 0.0–0.1)
Basophils Relative: 0 %
EOS ABS: 0.1 10*3/uL (ref 0.0–0.7)
Eosinophils Relative: 1 %
HCT: 43.1 % (ref 39.0–52.0)
Hemoglobin: 14 g/dL (ref 13.0–17.0)
Lymphocytes Relative: 22 %
Lymphs Abs: 1.7 10*3/uL (ref 0.7–4.0)
MCH: 28.5 pg (ref 26.0–34.0)
MCHC: 32.5 g/dL (ref 30.0–36.0)
MCV: 87.8 fL (ref 78.0–100.0)
MONOS PCT: 5 %
Monocytes Absolute: 0.4 10*3/uL (ref 0.1–1.0)
NEUTROS PCT: 72 %
Neutro Abs: 5.5 10*3/uL (ref 1.7–7.7)
Platelets: 175 10*3/uL (ref 150–400)
RBC: 4.91 MIL/uL (ref 4.22–5.81)
RDW: 14.5 % (ref 11.5–15.5)
WBC: 7.7 10*3/uL (ref 4.0–10.5)

## 2015-01-04 LAB — URINALYSIS, ROUTINE W REFLEX MICROSCOPIC
Bilirubin Urine: NEGATIVE
Glucose, UA: NEGATIVE mg/dL
Hgb urine dipstick: NEGATIVE
Ketones, ur: NEGATIVE mg/dL
LEUKOCYTES UA: NEGATIVE
NITRITE: NEGATIVE
PH: 5 (ref 5.0–8.0)
Protein, ur: NEGATIVE mg/dL
SPECIFIC GRAVITY, URINE: 1.029 (ref 1.005–1.030)
Urobilinogen, UA: 0.2 mg/dL (ref 0.0–1.0)

## 2015-01-04 LAB — RAPID URINE DRUG SCREEN, HOSP PERFORMED
Amphetamines: NOT DETECTED
BARBITURATES: NOT DETECTED
BENZODIAZEPINES: NOT DETECTED
COCAINE: NOT DETECTED
Opiates: NOT DETECTED
TETRAHYDROCANNABINOL: NOT DETECTED

## 2015-01-04 LAB — ACETAMINOPHEN LEVEL: Acetaminophen (Tylenol), Serum: <10 ug/mL — ABNORMAL LOW (ref 10–30)

## 2015-01-04 LAB — SALICYLATE LEVEL: Salicylate Lvl: <4 mg/dL (ref 2.8–30.0)

## 2015-01-04 LAB — ETHANOL: Alcohol, Ethyl (B): <5 mg/dL (ref <5)

## 2015-01-04 NOTE — ED Notes (Signed)
MD at bedside. 

## 2015-01-04 NOTE — ED Provider Notes (Signed)
CSN: [id number]     Arrival date & time 01/04/15  6213 History  By signing my name below, I, Phillip Richard, attest that this documentation has been prepared under the direction and in the presence of Phillip Essex, MD . Electronically Signed: Evelene Richard, Scribe. 01/04/2015. 8:19 AM.  Chief Complaint  Patient presents with  . Medical Clearance   The history is provided by the patient. No language interpreter was used.     HPI Comments:  Phillip Richard is a 64 y.o. male w who presents to the Emergency Department for medical clearance so that he can be placed in a detox facility. Pt would like detox from ETOH, crack and marijuana. He admits to drinking ETOH daily, drinks whatever he can get his hands on; last drink was one week ago. Pt notes he both snorts and smokes crack;  last used 2-3 weeks ago  and marijuana last smoked in one week. He denies IVDA, use of pills, SI/HI, auditory hallucinations, and seizure. He also denies CP, vomiting, diarrhea, and abdominal pain.  Pt has no physical complaints at this time; states sister and niece made him to be seen today; [facility] recommended he be cleared in the ED before scheduling an appointment with a counselor. He has detoxed in the past; his last attempt was  in 1993/1994.   PCP Larina Earthly  Past Medical History  Diagnosis Date  . Diabetes mellitus without complication [facility])    Past Surgical History  Procedure Laterality Date  . Hernia repair     No family history on file. Social History  Substance Use Topics  . Smoking status: Current Every Day Smoker -- 0.50 packs/day for 50 years    Types: Cigarettes  . Smokeless tobacco: Not on file  . Alcohol Use: Yes    Review of Systems  Constitutional: Negative for fever and chills.       + Medical Clearance  HENT: Negative for congestion.   Respiratory: Negative for shortness of breath.   Cardiovascular: Negative for chest pain.  Gastrointestinal: Negative for vomiting, abdominal pain and  diarrhea.  Genitourinary: Negative for dysuria.  Musculoskeletal: Negative for myalgias.  Skin: Negative for rash.  Neurological: Negative for seizures, syncope, weakness and headaches.  Hematological: Does not bruise/bleed easily.  All other systems reviewed and are negative.     Allergies  Review of patient's allergies indicates no known allergies.  Home Medications   Prior to Admission medications   Medication Sig Start Date End Date Taking? Authorizing Provider  Insulin Aspart Prot & Aspart (NOVOLOG 70/30 MIX) (70-30) 100 UNIT/ML Pen Inject 10 Units into the skin 2 (two) times daily.    Historical Provider, MD  lisinopril (PRINIVIL,ZESTRIL) 2.5 MG tablet Take 2.5 mg by mouth daily.    Historical Provider, MD  loratadine (CLARITIN) 10 MG tablet Take 10 mg by mouth daily.    Historical Provider, MD  metFORMIN (GLUCOPHAGE) 500 MG tablet Take by mouth daily.    Historical Provider, MD  naproxen (NAPROSYN) 375 MG tablet Take 375 mg by mouth daily.    Historical Provider, MD  Olopatadine HCl (PATADAY) 0.2 % SOLN Apply 1 drop to eye as needed (eye irritation). 1 drop to each eye    Historical Provider, MD  ondansetron (ZOFRAN) 4 MG tablet Take 1 tablet (4 mg total) by mouth every 6 (six) hours. 06/26/13   Charlann Lange, PA-C  oxyCODONE-acetaminophen (PERCOCET/ROXICET) 5-325 MG per tablet Take 1-2 tablets by mouth every 4 (four) hours as needed for severe  pain. 06/26/13   Charlann Lange, PA-C   BP 131/88 mmHg  Pulse 64  Temp(Src) 98.1 F (36.7 C) (Oral)  Resp 18  Ht 5\' 10"  (1.778 m)  Wt 160 lb (72.576 kg)  BMI 22.96 kg/m2  SpO2 100% Physical Exam  Constitutional: He is oriented to person, place, and time. He appears well-developed and well-nourished. No distress.  HENT:  Head: Normocephalic and atraumatic.  Mouth/Throat: Oropharynx is clear and moist. No oropharyngeal exudate.  Eyes: Conjunctivae and EOM are normal. Pupils are equal, round, and reactive to light.  Neck: Normal  range of motion. Neck supple.  No meningismus.  Cardiovascular: Normal rate, regular rhythm, normal heart sounds and intact distal pulses.   No murmur heard. Pulmonary/Chest: Effort normal and breath sounds normal. No respiratory distress.  Abdominal: Soft. There is no tenderness. There is no rebound and no guarding.  Musculoskeletal: Normal range of motion. He exhibits no edema or tenderness.  Neurological: He is alert and oriented to person, place, and time. No cranial nerve deficit. He exhibits normal muscle tone. Coordination normal.  No ataxia on finger to nose bilaterally. No pronator drift. 5/5 strength throughout. CN 2-12 intact.Equal grip strength. Sensation intact.   Skin: Skin is warm.  Psychiatric: He has a normal mood and affect. His behavior is normal.  Nursing note and vitals reviewed.   ED Course  Procedures   DIAGNOSTIC STUDIES:  Oxygen Saturation is 100% on RA, normal by my interpretation.    COORDINATION OF CARE:  8:19 AM Discussed treatment plan with pt at bedside and pt agreed to plan.  Labs Review Labs Reviewed  COMPREHENSIVE METABOLIC PANEL - Abnormal; Notable for the following:    Glucose, Bld 141 (*)    All other components within normal limits  ACETAMINOPHEN LEVEL - Abnormal; Notable for the following:    Acetaminophen (Tylenol), Serum <10 (*)    All other components within normal limits  CBC WITH DIFFERENTIAL/PLATELET  ETHANOL  SALICYLATE LEVEL  URINALYSIS, ROUTINE W REFLEX MICROSCOPIC (NOT AT [facility])  URINE RAPID DRUG SCREEN, HOSP PERFORMED    Imaging Review No results found. I have personally reviewed and evaluated these lab results as part of my medical decision-making.   EKG Interpretation None      MDM   Final diagnoses:  Polysubstance abuse   Requesting medical clearance for detox from cocaine, marijuana and alcohol. States last alcohol use was one week ago. He denies any withdrawal symptoms. No chest pain, shortness of breath,  abdominal pain, nausea or vomiting or diarrhea. No suicidal or homicidal thoughts.  No evidence of life threatening withdrawal.   Labs unremarkable. Hyperglycemia without DKA.  Patient will be given list of outpatient detox resources.   I personally performed the services described in this documentation, which was scribed in my presence. The recorded information has been reviewed and is accurate.   Phillip Essex, MD 01/04/15 [phone]

## 2015-01-04 NOTE — Discharge Instructions (Signed)
Polysubstance Abuse °When people abuse more than one drug or type of drug it is called polysubstance or polydrug abuse. For example, many smokers also drink alcohol. This is one form of polydrug abuse. Polydrug abuse also refers to the use of a drug to counteract an unpleasant effect produced by another drug. It may also be used to help with withdrawal from another drug. People who take stimulants may become agitated. Sometimes this agitation is countered with a tranquilizer. This helps protect against the unpleasant side effects. Polydrug abuse also refers to the use of different drugs at the same time.  °Anytime drug use is interfering with normal living activities, it has become abuse. This includes problems with family and friends. Psychological dependence has developed when your mind tells you that the drug is needed. This is usually followed by physical dependence which has developed when continuing increases of drug are required to get the same feeling or "high". This is known as addiction or chemical dependency. A person's risk is much higher if there is a history of chemical dependency in the family. °SIGNS OF CHEMICAL DEPENDENCY °· You have been told by friends or family that drugs have become a problem. °· You fight when using drugs. °· You are having blackouts (not remembering what you do while using). °· You feel sick from using drugs but continue using. °· You lie about use or amounts of drugs (chemicals) used. °· You need chemicals to get you going. °· You are suffering in work performance or in school because of drug use. °· You get sick from use of drugs but continue to use anyway. °· You need drugs to relate to people or feel comfortable in social situations. °· You use drugs to forget problems. °"Yes" answered to any of the above signs of chemical dependency indicates there are problems. The longer the use of drugs continues, the greater the problems will become. °If there is a family history of  drug or alcohol use, it is best not to experiment with these drugs. Continual use leads to tolerance. After tolerance develops more of the drug is needed to get the same feeling. This is followed by addiction. With addiction, drugs become the most important part of life. It becomes more important to take drugs than participate in the other usual activities of life. This includes relating to friends and family. Addiction is followed by dependency. Dependency is a condition where drugs are now needed not just to get high, but to feel normal. °Addiction cannot be cured but it can be stopped. This often requires outside help and the care of professionals. Treatment centers are listed in the yellow pages under: Cocaine, Narcotics, and Alcoholics Anonymous. Most hospitals and clinics can refer you to a specialized care center. Talk to your caregiver if you need help. °  °This information is not intended to replace advice given to you by your health care provider. Make sure you discuss any questions you have with your health care provider. °  °Document Released: 10/07/2004 Document Revised: 05/10/2011 Document Reviewed: 02/20/2014 °Elsevier Interactive Patient Education ©2016 Elsevier Inc. ° ° ° °Emergency Department Resource Guide °1) Find a Doctor and Pay Out of Pocket °Although you won't have to find out who is covered by your insurance plan, it is a good idea to ask around and get recommendations. You will then need to call the office and see if the doctor you have chosen will accept you as a new patient and what types of options   they offer for patients who are self-pay. Some doctors offer discounts or will set up payment plans for their patients who do not have insurance, but you will need to ask so you aren't surprised when you get to your appointment. ° °2) Contact Your Local Health Department °Not all health departments have doctors that can see patients for sick visits, but many do, so it is worth a call to see if  yours does. If you don't know where your local health department is, you can check in your phone book. The CDC also has a tool to help you locate your state's health department, and many state websites also have listings of all of their local health departments. ° °3) Find a Walk-in Clinic °If your illness is not likely to be very severe or complicated, you may want to try a walk in clinic. These are popping up all over the country in pharmacies, drugstores, and shopping centers. They're usually staffed by nurse practitioners or physician assistants that have been trained to treat common illnesses and complaints. They're usually fairly quick and inexpensive. However, if you have serious medical issues or chronic medical problems, these are probably not your best option. ° °No Primary Care Doctor: °- Call [facility] Connect at  832-8000 - they can help you locate a primary care doctor that  accepts your insurance, provides certain services, etc. °- Physician Referral Service- 1-800-533-3463 ° °Chronic Pain Problems: °Organization         Address  Phone   Notes  °[facility]  (336) 297-2271 Patients need to be referred by their primary care doctor.  ° °Medication Assistance: °Organization         Address  Phone   Notes  °[place] [facility] [address] °[place], [place] [address] (336) 641-8030 --Must be a resident of [place] °-- Must have NO insurance coverage whatsoever (no Medicaid/ Medicare, etc.) °-- The pt. MUST have a primary care doctor that directs their care regularly and follows them in the community °  °[facility]  (866) 331-1348   °[employer]  (888) 892-1162   ° °Agencies that provide inexpensive medical care: °Organization         Address  Phone   Notes  °[facility]  (336) 832-8035   °[facility]    (336) 832-7272   °[facility] [address] °[place], [place] [address] (336) 832-4777    °[facility] [address], °[place] (336) 271-4999   °[facility]    (336) 373-0678   °[facility]    (336) 272-1050   °[facility] ° [address], [place] Phone:  (336) 832-4444, Fax:  (336) 832-4440 Hours of Operation:  9 am - 6 pm, M-F.  Also accepts Medicaid/Medicare and self-pay.  ° [facility] ° [address], [place] Phone: (336) 832-3150, Fax: (336) 832-3151. Hours of Operation:  8:30 am - 5:30 pm, M-F.  Also accepts Medicaid and self-pay.  °[facility] [place] [address], [place] Phone: (336) 878-6027   °[facility] [address], [place], [place] (336)723-1848, Ext. 123 Mondays & Thursdays: 7-9 AM.  First 15 patients are seen on a first come, first serve basis. °  ° °Medicaid-accepting [place] Providers: ° °Organization         Address  Phone   Notes  °[facility] [address], [place] (336) 641-2100 Also accepts self-pay patients.  °[facility] [address], [place] ° (336) 856-9996   °[facility] [address] [place] [address], [place] (336) 288-8857   °[facility] [address] [place] [address], [place] (336) 299-7000   °Veita Bland [address], [place]  ° (336) 373-1557 Only accepts [place] Access Medicaid patients after they have their name applied to their card.  ° °Self-Pay (no insurance) in [place]: ° °Organization         Address  Phone   Notes  °Sickle Cell Patients, [facility] [address], [place] (336) 832-1970   °[facility] [address], [place] (336) 832-4400   °[facility] ° [address],  (336) 992-4800   °[facility]/Dr. Osei-Bonsu ° [address] [place] [address], [place] or [address], [place] (336)  841-8500 Phone number for both [place] and [place] locations is the same.  °[facility] [address], [place] (336) 299-0000   °[facility] [address] [place] [address], [place] or [address] (336) 852-7530 °(336) 878-2260   °[facility] [address], [place] (336) 350-1642, phone; (336) 294-5005, fax Sees patients 1st and 3rd Saturday of every month.  Must not qualify for public or private insurance (i.e. Medicaid, Medicare, [place] Health Choice, Veterans' Benefits) • Household income should be no more than 200% of the poverty level •The clinic cannot treat you if you are pregnant or think you are pregnant • Sexually transmitted diseases are not treated at the clinic.  ° ° °Dental Care: °Organization         Address  Phone  Notes  °[facility] [facility] [address], [place] (336) 641-6152 Accepts children up to age 21 who are enrolled in Medicaid or [place] Health Choice; pregnant women with a Medicaid card; and children who have applied for Medicaid or [place] Health Choice, but were declined, whose parents can pay a reduced fee at time of service.  °[facility] [place]  [address], [place] (336) 641-7733 Accepts children up to age 21 who are enrolled in Medicaid or [place] Health Choice; pregnant women with a Medicaid card; and children who have applied for Medicaid or [place] Health Choice, but were declined, whose parents can pay a reduced fee at time of service.  °[facility] ° [address], [place] (336) 641-4533 Patients are seen by appointment only. Walk-ins are not accepted. [facility] will see patients 18 years of age and older. °Monday - Tuesday (8am-5pm) °Most Wednesdays (8:30-5pm) °$30 per visit, cash only  °[facility] ° [address], [place] (336) 641-4533 Patients are seen by  appointment only. Walk-ins are not accepted. [facility] will see patients 18 years of age and older. °One Wednesday Evening (Monthly: Volunteer Based).  $30 per visit, cash only  °[facility]  (919) 537-3737 for adults; Children under age 4, call Graduate Pediatric Dentistry at (919) [phone]. Children aged 4-14, please call (919) [phone] to request a pediatric application. ° Dental services are provided in all areas of dental care including fillings, crowns and bridges, complete and partial dentures,   implants, gum treatment, root canals, and extractions. Preventive care is also provided. Treatment is provided to both adults and children. °Patients are selected via a lottery and there is often a waiting list. °  °[facility] [address], °[place] ° (336) 763-8833 [email or web address] °  °[facility] [address], [place], [place] (336)723-1848, Ext. 123 Second and Fourth Thursday of each month, opens at 6:30 AM; Clinic ends at 9 AM.  Patients are seen on a first-come first-served basis, and a limited number are seen during each clinic.  ° °[facility] ° [address], [place], [place] (336) 723-7904   Eligibility Requirements °You must have lived in [place], [place], or [place] counties for at least the last three months. °  You cannot be eligible for state or federal sponsored healthcare insurance, including Veterans Administration, Medicaid, or Medicare. °  You generally cannot be eligible for healthcare insurance through your employer.  °  How to apply: °Eligibility screenings are held every Tuesday and Wednesday afternoon from 1:00 pm until 4:00 pm. You do not need an appointment for the interview!  °[facility] [address], [place], [place] 336-631-2330   °[facility]  336-342-8273   °[facility]  336-703-3100   °[facility]  336-570-6415    ° °Behavioral Health Resources in the Community: °Intensive Outpatient Programs °Organization         Address  Phone  Notes  °[facility] [address], [place], [place] 336-878-6098   °[facility] [address], [place], [place] 336-832-9800   °[facility] [address], [place], [place] ° 336-882-2125   °[facility] [address],  °[place], [place] 1-800-853-5163 or 336-641-4981   °Substance Abuse Resources °Organization         Address  Phone  Notes  °[facility]  336-882-2125   °[facility]  336-784-9470   °[facility]  336-285-9073   °[facility]  336-845-3988   °Residential & Outpatient Substance Abuse Program  1-800-659-3381   °Psychological Services °Organization         Address  Phone  Notes  °[facility]  336- 832-9600   °[facility]  336- 378-7881   °[facility] [address], [place] 1-800-853-5163 or 336-641-4981   ° °Mobile Crisis Teams °Organization         Address  Phone  Notes  °[facility], Mobile Crisis Care Unit  1-877-626-1772   °Assertive °[facility] ° [address] [place], [place] 336-834-9664   °Sharon DeEsch [address] ° [place] 336-554-5454   ° °Self-Help/Support Groups °Organization         Address  Phone             Notes  °[facility]  - variety of support groups  336- 373-1402 Call for more information  °Narcotics Anonymous (NA), [facility] [address], °[place]   2 meetings at this location  ° °Residential Treatment Programs °Organization         Address  Phone  Notes  °[facility] [address],    °   1-866-801-8205   °[facility] ° [address], [place],  704-293-8524   °[facility] [address], [place] 336-845-3988 Admissions: 8am-3pm M-F  °[facility]    [facility] [address]    °[place], [place] 336-841-1104   °[facility] [address], [place], [place] 336-379-7146   °[facility] [address]  °[place], [place] 336-285-9073   °[facility] Intensive Outpatient [address], , [place] 336-852-3033   °[facility] [address]  °[place], [place] 1-877-615-2722 or 336-784-9470   °[facility] [address] [place], [place] 336-227-7417 Accepts Medicaid  °[facility] [address]  ° [place] 1-800-659-3381 Substance Abuse/Addiction Treatment  ° °[place] Behavioral Health Resources °Organization         Address  Phone  Notes  °[facility]  (888) 581-9988   °Julie Brannon, PhD [address] [place], [place]   (336) 349-5553 or (336) 951-0000   °[facility]   [address] °[place], [place] (336) 349-4454   °[facility] [address], [place], [place] (336) 342-8316 Insurance/Medicaid/sponsorship through [facility]  °[facility] [address]                                    [place], [place] (336) 342-8316 Therapy/tele-psych/case  °[facility] [address]  ° [place], [place] (336) 349-2233    °Dr. Arfeen  (336) 349-4544   °[facility] [place]  [employer] [place] [facility] 1) [address], [place] °2) [address], [place] °3)  [address], [place] (336) 349-3220 °(336) 342-7768 ° °(336) 342-8140   °[place] [facility] (336) 342-1394 or (336) 342-3537 (After Hours)    ° °\ ° °

## 2015-01-04 NOTE — ED Notes (Signed)
Here for medical clearance for detox for crack cocaine, marijuana, alcohol. Last time had marijuana approx 3 months ago, last time consumed alcohol was last week (will drink any type of alcohol). Last time used crack cocaine: does not recall. Denies any pill use.

## 2015-01-04 NOTE — ED Notes (Signed)
Despite multiple attempts, pt unable to void to provided urine spec as ordered by EDP, EDP informed, states may cancel urine collection.

## 2015-09-16 ENCOUNTER — Telehealth: Payer: Self-pay | Admitting: Behavioral Health

## 2015-09-16 NOTE — Telephone Encounter (Signed)
Attempted to reach patient for Pre-Visit Call. Voicemail unavailable.

## 2015-09-17 ENCOUNTER — Ambulatory Visit: Payer: Medicare Other | Admitting: Physician Assistant

## 2015-10-27 ENCOUNTER — Ambulatory Visit (HOSPITAL_BASED_OUTPATIENT_CLINIC_OR_DEPARTMENT_OTHER)
Admission: RE | Admit: 2015-10-27 | Discharge: 2015-10-27 | Disposition: A | Discharge status: Home / Self Care | Payer: Medicare Other | Source: Ambulatory Visit | Attending: Physician Assistant | Admitting: Physician Assistant

## 2015-10-27 ENCOUNTER — Encounter: Payer: Self-pay | Admitting: Physician Assistant

## 2015-10-27 ENCOUNTER — Ambulatory Visit (INDEPENDENT_AMBULATORY_CARE_PROVIDER_SITE_OTHER): Payer: Medicare Other | Admitting: Physician Assistant

## 2015-10-27 VITALS — BP 140/70 | HR 51 | Temp 97.8°F | Resp 16 | Ht 70.0 in | Wt 177.0 lb

## 2015-10-27 DIAGNOSIS — M1611 Unilateral primary osteoarthritis, right hip: Secondary | ICD-10-CM | POA: Diagnosis not present

## 2015-10-27 DIAGNOSIS — E1310 Other specified diabetes mellitus with ketoacidosis without coma: Secondary | ICD-10-CM

## 2015-10-27 DIAGNOSIS — E1165 Type 2 diabetes mellitus with hyperglycemia: Secondary | ICD-10-CM | POA: Diagnosis not present

## 2015-10-27 DIAGNOSIS — T148 Other injury of unspecified body region: Secondary | ICD-10-CM

## 2015-10-27 DIAGNOSIS — I739 Peripheral vascular disease, unspecified: Secondary | ICD-10-CM | POA: Diagnosis not present

## 2015-10-27 DIAGNOSIS — Z794 Long term (current) use of insulin: Secondary | ICD-10-CM

## 2015-10-27 DIAGNOSIS — W3400XA Accidental discharge from unspecified firearms or gun, initial encounter: Secondary | ICD-10-CM | POA: Diagnosis not present

## 2015-10-27 DIAGNOSIS — Z0389 Encounter for observation for other suspected diseases and conditions ruled out: Secondary | ICD-10-CM | POA: Diagnosis not present

## 2015-10-27 DIAGNOSIS — IMO0001 Reserved for inherently not codable concepts without codable children: Secondary | ICD-10-CM

## 2015-10-27 LAB — LIPID PANEL
CHOLESTEROL: 122 mg/dL (ref 0–200)
HDL: 36.7 mg/dL — ABNORMAL LOW (ref >39.00)
LDL Cholesterol: 65 mg/dL (ref 0–99)
NonHDL: 84.92
TRIGLYCERIDES: 101 mg/dL (ref 0.0–149.0)
Total CHOL/HDL Ratio: 3
VLDL: 20.2 mg/dL (ref 0.0–40.0)

## 2015-10-27 LAB — COMPREHENSIVE METABOLIC PANEL
ALBUMIN: 3.8 g/dL (ref 3.5–5.2)
ALK PHOS: 57 U/L (ref 39–117)
ALT: 14 U/L (ref 0–53)
AST: 13 U/L (ref 0–37)
BILIRUBIN TOTAL: 0.3 mg/dL (ref 0.2–1.2)
BUN: 18 mg/dL (ref 6–23)
CALCIUM: 9.1 mg/dL (ref 8.4–10.5)
CO2: 28 mEq/L (ref 19–32)
CREATININE: 1.15 mg/dL (ref 0.40–1.50)
Chloride: 111 mEq/L (ref 96–112)
GFR: 81.51 mL/min (ref >60.00)
Glucose, Bld: 98 mg/dL (ref 70–99)
Potassium: 4.3 mEq/L (ref 3.5–5.1)
Sodium: 144 mEq/L (ref 135–145)
Total Protein: 7 g/dL (ref 6.0–8.3)

## 2015-10-27 LAB — MICROALBUMIN / CREATININE URINE RATIO
CREATININE, U: 175.9 mg/dL
MICROALB/CREAT RATIO: 0.4 mg/g (ref 0.0–30.0)

## 2015-10-27 LAB — HEMOGLOBIN A1C: HEMOGLOBIN A1C: 7.4 % — AB (ref 4.6–6.5)

## 2015-10-27 MED ORDER — LISINOPRIL 2.5 MG PO TABS: 2.5000 mg | ORAL_TABLET | Freq: Every day | ORAL | 1 refills | Status: DC

## 2015-10-27 MED ORDER — METFORMIN HCL 500 MG PO TABS: 500.0000 mg | ORAL_TABLET | Freq: Every day | ORAL | 1 refills | Status: DC

## 2015-10-27 NOTE — Patient Instructions (Signed)
Please go to the lab for blood work. Then go downstairs for imaging.  I will call with results.  For now, I am restarting the Metformin and Lisinopril. I am holding off on the insulin until we get your a1c results.  Once we see what is going on in the leg, we can set you up with a specialist.
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
## 2015-10-28 ENCOUNTER — Other Ambulatory Visit: Payer: Self-pay | Admitting: *Deleted

## 2015-10-28 ENCOUNTER — Other Ambulatory Visit: Payer: Self-pay | Admitting: Physician Assistant

## 2015-10-28 DIAGNOSIS — M79651 Pain in right thigh: Secondary | ICD-10-CM

## 2015-10-28 MED ORDER — NAPROXEN 375 MG PO TABS: 375.0000 mg | ORAL_TABLET | Freq: Two times a day (BID) | ORAL | 1 refills | Status: DC | PRN

## 2015-10-31 DIAGNOSIS — E1165 Type 2 diabetes mellitus with hyperglycemia: Secondary | ICD-10-CM | POA: Insufficient documentation

## 2015-10-31 DIAGNOSIS — E119 Type 2 diabetes mellitus without complications: Secondary | ICD-10-CM | POA: Insufficient documentation

## 2015-10-31 DIAGNOSIS — W3400XA Accidental discharge from unspecified firearms or gun, initial encounter: Secondary | ICD-10-CM | POA: Insufficient documentation

## 2015-11-06 ENCOUNTER — Telehealth: Payer: Self-pay

## 2015-11-06 NOTE — Telephone Encounter (Signed)
Medical records received from [facility]. Records forwarded to PCP.

## 2015-11-13 DIAGNOSIS — M60261 Foreign body granuloma of soft tissue, not elsewhere classified, right lower leg: Secondary | ICD-10-CM | POA: Diagnosis not present

## 2015-11-21 ENCOUNTER — Telehealth: Payer: Self-pay | Admitting: Physician Assistant

## 2015-11-21 NOTE — Telephone Encounter (Signed)
Caller name: Louis Matte Relationship to patient: Sister Can be reached: [phone] Pharmacy:  Reason for call: Sister (Not on DPR) called stating that she does not want patient to go to [facility], she wants him to stay within the [facility] system. He was referred to [facility] but she wants him to go to a [facility] ortho.

## 2015-11-21 NOTE — Telephone Encounter (Signed)
Patient would need to call and request this before I make any changes since he is not requesting today and the sister is not on the DPR.

## 2016-07-29 ENCOUNTER — Telehealth: Payer: Self-pay | Admitting: Physician Assistant

## 2016-07-29 ENCOUNTER — Other Ambulatory Visit: Payer: Self-pay | Admitting: Physician Assistant

## 2016-07-29 NOTE — Telephone Encounter (Signed)
Sister states that pt needs refills on naproxen, novolog, metformin, accu chek test strips, and lisinopril. Sister states that she was unaware that Phillip Richard had moved (she over sees pt care and does not live at same address) she states that she is unsure if pt will transfer care at this time, that she would like to take a ride out here first to check the distance. I advised her that pt is due for an appt to follow up with his care and that I would see a message to see if Phillip Richard could call in a 30 day supply for refills until they decide on an office. [facility] on [address]

## 2016-07-29 NOTE — Telephone Encounter (Signed)
Patient is past due for medication refills, labs and office visit. Is it ok for refill of 30 day until appointment scheduled? Please advise

## 2016-07-29 NOTE — Telephone Encounter (Signed)
I have not seen him in almost a year. He has only been seen once to establish care and has been non-adherent to medications and follow-up. If taking medications as directed since last refill, he should have been out of medication since October of last year. He needs follow-up before I will agree to any more refills.

## 2016-07-30 NOTE — Telephone Encounter (Signed)
LMOVM of sister Lorriane Shire about patient is needing an appointment. It has been over a year since his last visit and he may have been out of medication since October. She was deciding if they would come here or stay at [place].

## 2016-08-02 ENCOUNTER — Telehealth: Payer: Self-pay | Admitting: Physician Assistant

## 2016-08-02 NOTE — Telephone Encounter (Signed)
OK w me.  

## 2016-08-02 NOTE — Telephone Encounter (Signed)
Ok with me 

## 2016-08-02 NOTE — Telephone Encounter (Signed)
Patient would like to transfer from [facility] to Dr. Nani Ravens, please advise

## 2016-08-03 ENCOUNTER — Encounter: Payer: Self-pay | Admitting: Behavioral Health

## 2016-08-03 ENCOUNTER — Telehealth: Payer: Self-pay | Admitting: Behavioral Health

## 2016-08-03 NOTE — Telephone Encounter (Signed)
Pre-Visit Call completed with patient and chart updated.   Pre-Visit Info documented in Specialty Comments under SnapShot.    

## 2016-08-03 NOTE — Telephone Encounter (Signed)
Patient scheduled for 08/04/16 with Dr. Nani Ravens.

## 2016-08-03 NOTE — Telephone Encounter (Signed)
Patient sister Lorriane Shire wanted to stay in [facility] location. Have appointment scheduled with Dr Nani Ravens on 08/04/16
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
## 2016-08-04 ENCOUNTER — Encounter: Payer: Self-pay | Admitting: Family Medicine

## 2016-08-04 ENCOUNTER — Ambulatory Visit (INDEPENDENT_AMBULATORY_CARE_PROVIDER_SITE_OTHER): Payer: Medicare Other | Admitting: Family Medicine

## 2016-08-04 VITALS — BP 118/68 | HR 66 | Temp 97.5°F | Ht 70.0 in | Wt 155.8 lb

## 2016-08-04 DIAGNOSIS — E119 Type 2 diabetes mellitus without complications: Secondary | ICD-10-CM

## 2016-08-04 DIAGNOSIS — Z0000 Encounter for general adult medical examination without abnormal findings: Secondary | ICD-10-CM | POA: Diagnosis not present

## 2016-08-04 DIAGNOSIS — M79651 Pain in right thigh: Secondary | ICD-10-CM | POA: Diagnosis not present

## 2016-08-04 DIAGNOSIS — Z794 Long term (current) use of insulin: Secondary | ICD-10-CM

## 2016-08-04 MED ORDER — GLUCOSE BLOOD VI STRP: 1.0000 | ORAL_STRIP | 1 refills | Status: AC | PRN

## 2016-08-04 MED ORDER — INSULIN ASPART PROT & ASPART (70-30 MIX) 100 UNIT/ML PEN: 10.0000 [IU] | PEN_INJECTOR | Freq: Two times a day (BID) | SUBCUTANEOUS | 5 refills | Status: DC

## 2016-08-04 MED ORDER — ATORVASTATIN CALCIUM 40 MG PO TABS: 40.0000 mg | ORAL_TABLET | Freq: Every day | ORAL | 3 refills | Status: DC

## 2016-08-04 MED ORDER — OLOPATADINE HCL 0.2 % OP SOLN: 1.0000 [drp] | OPHTHALMIC | 5 refills | Status: DC | PRN

## 2016-08-04 MED ORDER — LISINOPRIL 2.5 MG PO TABS: 2.5000 mg | ORAL_TABLET | Freq: Every day | ORAL | 1 refills | Status: DC

## 2016-08-04 MED ORDER — LORATADINE 10 MG PO TABS: 10.0000 mg | ORAL_TABLET | Freq: Every day | ORAL | 3 refills | Status: AC

## 2016-08-04 MED ORDER — NAPROXEN 375 MG PO TABS: 375.0000 mg | ORAL_TABLET | Freq: Two times a day (BID) | ORAL | 1 refills | Status: DC | PRN

## 2016-08-04 MED ORDER — METFORMIN HCL 500 MG PO TABS: 500.0000 mg | ORAL_TABLET | Freq: Every day | ORAL | 1 refills | Status: DC
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
## 2016-08-04 NOTE — Patient Instructions (Addendum)
If you do not hear anything about your referral in the next 1-2 weeks, call our office and ask for an update.   [name] , Thank you for taking time to come for your Medicare Wellness Visit. I appreciate your ongoing commitment to your health goals. Please review the following plan we discussed and let me know if I can assist you in the future.   These are the goals we discussed: Goals    . Maintain lifestyle       This is a list of the screening recommended for you and due dates:  Health Maintenance  Topic Date Due  .  Hepatitis C: One time screening is recommended by Center for Disease Control  (CDC) for  adults born from [birth year] through 1967.   1950-06-01  . Complete foot exam   07/24/1958  . DTaP/Tdap/Td vaccine (1 - Tdap) 07/24/1967  . Tetanus Vaccine  07/24/1967  . Colon Cancer Screening  07/24/1998  . Pneumonia vaccines (1 of 2 - PCV13) 07/23/2013  . Eye exam for diabetics  06/30/2015  . Hemoglobin A1C  04/28/2016  . Flu Shot  09/29/2016   Continue to eat heart healthy diet (full of fruits, vegetables, whole grains, lean protein, water--limit salt, fat, and sugar intake) and increase physical activity as tolerated.  Continue doing brain stimulating activities (puzzles, reading, adult coloring books, staying active) to keep memory sharp.   Try chair exercises to strengthen legs  Bring a copy of your advance directives to your next office visit. Health Maintenance, Male A healthy lifestyle and preventive care is important for your health and wellness. Ask your health care provider about what schedule of regular examinations is right for you. What should I know about weight and diet? Eat a Healthy Diet  Eat plenty of vegetables, fruits, whole grains, low-fat dairy products, and lean protein.  Do not eat a lot of foods high in solid fats, added sugars, or salt.  Maintain a Healthy Weight Regular exercise can help you achieve or maintain a healthy weight. You should:  Do at  least 150 minutes of exercise each week. The exercise should increase your heart rate and make you sweat (moderate-intensity exercise).  Do strength-training exercises at least twice a week.  Watch Your Levels of Cholesterol and Blood Lipids  Have your blood tested for lipids and cholesterol every 5 years starting at 68 years of age. If you are at high risk for heart disease, you should start having your blood tested when you are 68 years old. You may need to have your cholesterol levels checked more often if: ? Your lipid or cholesterol levels are high. ? You are older than 68 years of age. ? You are at high risk for heart disease.  What should I know about cancer screening? Many types of cancers can be detected early and may often be prevented. Lung Cancer  You should be screened every year for lung cancer if: ? You are a current smoker who has smoked for at least 30 years. ? You are a former smoker who has quit within the past 15 years.  Talk to your health care provider about your screening options, when you should start screening, and how often you should be screened.  Colorectal Cancer  Routine colorectal cancer screening usually begins at 68 years of age and should be repeated every 5-10 years until you are 68 years old. You may need to be screened more often if early forms of precancerous polyps or  small growths are found. Your health care provider may recommend screening at an earlier age if you have risk factors for colon cancer.  Your health care provider may recommend using home test kits to check for hidden blood in the stool.  A small camera at the end of a tube can be used to examine your colon (sigmoidoscopy or colonoscopy). This checks for the earliest forms of colorectal cancer.  Prostate and Testicular Cancer  Depending on your age and overall health, your health care provider may do certain tests to screen for prostate and testicular cancer.  Talk to your health  care provider about any symptoms or concerns you have about testicular or prostate cancer.  Skin Cancer  Check your skin from head to toe regularly.  Tell your health care provider about any new moles or changes in moles, especially if: ? There is a change in a mole's size, shape, or color. ? You have a mole that is larger than a pencil eraser.  Always use sunscreen. Apply sunscreen liberally and repeat throughout the day.  Protect yourself by wearing long sleeves, pants, a wide-brimmed hat, and sunglasses when outside.  What should I know about heart disease, diabetes, and high blood pressure?  If you are 79-26 years of age, have your blood pressure checked every 3-5 years. If you are 57 years of age or older, have your blood pressure checked every year. You should have your blood pressure measured twice-once when you are at a hospital or clinic, and once when you are not at a hospital or clinic. Record the average of the two measurements. To check your blood pressure when you are not at a hospital or clinic, you can use: ? An automated blood pressure machine at a pharmacy. ? A home blood pressure monitor.  Talk to your health care provider about your target blood pressure.  If you are between 38-23 years old, ask your health care provider if you should take aspirin to prevent heart disease.  Have regular diabetes screenings by checking your fasting blood sugar level. ? If you are at a normal weight and have a low risk for diabetes, have this test once every three years after the age of 50. ? If you are overweight and have a high risk for diabetes, consider being tested at a younger age or more often.  A one-time screening for abdominal aortic aneurysm (AAA) by ultrasound is recommended for men aged 26-75 years who are current or former smokers. What should I know about preventing infection? Hepatitis B If you have a higher risk for hepatitis B, you should be screened for this virus.  Talk with your health care provider to find out if you are at risk for hepatitis B infection. Hepatitis C Blood testing is recommended for:  Everyone born from [birth year] through 1965.  Anyone with known risk factors for hepatitis C.  Sexually Transmitted Diseases (STDs)  You should be screened each year for STDs including gonorrhea and chlamydia if: ? You are sexually active and are younger than 68 years of age. ? You are older than 68 years of age and your health care provider tells you that you are at risk for this type of infection. ? Your sexual activity has changed since you were last screened and you are at an increased risk for chlamydia or gonorrhea. Ask your health care provider if you are at risk.  Talk with your health care provider about whether you are at high risk of  being infected with HIV. Your health care provider may recommend a prescription medicine to help prevent HIV infection.  What else can I do?  Schedule regular health, dental, and eye exams.  Stay current with your vaccines (immunizations).  Do not use any tobacco products, such as cigarettes, chewing tobacco, and e-cigarettes. If you need help quitting, ask your health care provider.  Limit alcohol intake to no more than 2 drinks per day. One drink equals 12 ounces of beer, 5 ounces of wine, or 1 ounces of hard liquor.  Do not use street drugs.  Do not share needles.  Ask your health care provider for help if you need support or information about quitting drugs.  Tell your health care provider if you often feel depressed.  Tell your health care provider if you have ever been abused or do not feel safe at home. This information is not intended to replace advice given to you by your health care provider. Make sure you discuss any questions you have with your health care provider. Document Released: 08/14/2007 Document Revised: 10/15/2015 Document Reviewed: 11/19/2014 Elsevier Interactive Patient Education   [employer].

## 2016-08-05 ENCOUNTER — Other Ambulatory Visit: Payer: Self-pay | Admitting: Family Medicine

## 2016-08-05 DIAGNOSIS — E119 Type 2 diabetes mellitus without complications: Secondary | ICD-10-CM

## 2016-08-05 DIAGNOSIS — Z794 Long term (current) use of insulin: Principal | ICD-10-CM

## 2016-08-05 LAB — HEMOGLOBIN A1C: Hgb A1c MFr Bld: 7 % — ABNORMAL HIGH (ref 4.6–6.5)

## 2016-08-05 LAB — LIPID PANEL
CHOL/HDL RATIO: 3
Cholesterol: 124 mg/dL (ref 0–200)
HDL: 43.9 mg/dL (ref >39.00)
LDL CALC: 58 mg/dL (ref 0–99)
NonHDL: 79.88
Triglycerides: 109 mg/dL (ref 0.0–149.0)
VLDL: 21.8 mg/dL (ref 0.0–40.0)

## 2016-08-05 LAB — COMPREHENSIVE METABOLIC PANEL
ALT: 12 U/L (ref 0–53)
AST: 15 U/L (ref 0–37)
Albumin: 4.3 g/dL (ref 3.5–5.2)
Alkaline Phosphatase: 64 U/L (ref 39–117)
BUN: 17 mg/dL (ref 6–23)
CALCIUM: 10.3 mg/dL (ref 8.4–10.5)
CHLORIDE: 107 meq/L (ref 96–112)
CO2: 32 meq/L (ref 19–32)
CREATININE: 1.33 mg/dL (ref 0.40–1.50)
GFR: 68.76 mL/min (ref >60.00)
GLUCOSE: 57 mg/dL — AB (ref 70–99)
Potassium: 4.3 mEq/L (ref 3.5–5.1)
SODIUM: 144 meq/L (ref 135–145)
Total Bilirubin: 0.5 mg/dL (ref 0.2–1.2)
Total Protein: 7.6 g/dL (ref 6.0–8.3)

## 2016-08-05 LAB — CBC
HCT: 45.4 % (ref 39.0–52.0)
Hemoglobin: 14.8 g/dL (ref 13.0–17.0)
MCHC: 32.6 g/dL (ref 30.0–36.0)
MCV: 88.3 fl (ref 78.0–100.0)
Platelets: 199 10*3/uL (ref 150.0–400.0)
RBC: 5.14 Mil/uL (ref 4.22–5.81)
RDW: 14.1 % (ref 11.5–15.5)
WBC: 8.8 10*3/uL (ref 4.0–10.5)

## 2016-09-23 ENCOUNTER — Ambulatory Visit (INDEPENDENT_AMBULATORY_CARE_PROVIDER_SITE_OTHER): Payer: Medicare Other | Admitting: Family Medicine

## 2016-09-23 ENCOUNTER — Encounter: Payer: Self-pay | Admitting: Family Medicine

## 2016-09-23 DIAGNOSIS — M79604 Pain in right leg: Secondary | ICD-10-CM | POA: Diagnosis not present

## 2016-09-23 MED ORDER — NORTRIPTYLINE HCL 10 MG PO CAPS: 10.0000 mg | ORAL_CAPSULE | Freq: Every day | ORAL | 1 refills | Status: DC

## 2016-09-23 NOTE — Patient Instructions (Signed)
Your ultrasound is very reassuring. I don't see a defect in your quad or a large amount of scar tissue though this is consistent with a small amount of scar tissue within the quad and a small nerve in this area. Try nortriptyline 10mg  at bedtime. Start quad exercises I showed you 3 sets of 10 (straight leg raises and knee extensions). Add ankle weight if these become too easy. Call me in a week to let me know how you're doing - I want to make sure you tolerate the nortriptyline before we go up on it. Follow up with me in 1 month.

## 2016-09-25 DIAGNOSIS — M79604 Pain in right leg: Secondary | ICD-10-CM | POA: Insufficient documentation

## 2016-09-25 NOTE — Assessment & Plan Note (Signed)
MSK ultrasound surprisingly with normal quad musculature.  Exam also reassuring though consistent with a little scar tissue within quad and local nerve damage.  Shown quad strengthening exercises to do daily.  Try nortriptyline at low dose - call us in a week to let us know how he's tolerating this before we go up on this.  F/u in 1 month otherwise.
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
## 2016-10-22 ENCOUNTER — Ambulatory Visit: Payer: Medicare Other | Admitting: Family Medicine

## 2016-10-29 ENCOUNTER — Ambulatory Visit: Payer: Medicare Other | Admitting: Family Medicine

## 2016-11-05 ENCOUNTER — Ambulatory Visit: Payer: Medicare Other | Admitting: Family Medicine

## 2016-11-12 ENCOUNTER — Ambulatory Visit: Payer: Medicare Other | Admitting: Family Medicine

## 2016-11-18 ENCOUNTER — Encounter: Payer: Self-pay | Admitting: Family Medicine

## 2016-11-18 ENCOUNTER — Ambulatory Visit (INDEPENDENT_AMBULATORY_CARE_PROVIDER_SITE_OTHER): Payer: Medicare Other | Admitting: Family Medicine

## 2016-11-18 ENCOUNTER — Other Ambulatory Visit: Payer: Self-pay | Admitting: Family Medicine

## 2016-11-18 VITALS — BP 132/78 | HR 65 | Temp 97.8°F | Ht 70.5 in | Wt 153.1 lb

## 2016-11-18 DIAGNOSIS — M79604 Pain in right leg: Secondary | ICD-10-CM | POA: Diagnosis not present

## 2016-11-18 DIAGNOSIS — Z794 Long term (current) use of insulin: Secondary | ICD-10-CM

## 2016-11-18 DIAGNOSIS — Z23 Encounter for immunization: Secondary | ICD-10-CM | POA: Diagnosis not present

## 2016-11-18 DIAGNOSIS — E119 Type 2 diabetes mellitus without complications: Secondary | ICD-10-CM | POA: Diagnosis not present

## 2016-11-18 LAB — MICROALBUMIN / CREATININE URINE RATIO
Creatinine,U: 247.7 mg/dL
MICROALB/CREAT RATIO: 1 mg/g (ref 0.0–30.0)
Microalb, Ur: 2.5 mg/dL — ABNORMAL HIGH (ref 0.0–1.9)

## 2016-11-18 LAB — HEMOGLOBIN A1C: Hgb A1c MFr Bld: 6.8 % — ABNORMAL HIGH (ref 4.6–6.5)

## 2016-11-18 MED ORDER — PNEUMOCOCCAL 13-VAL CONJ VACC IM SUSP: 0.5000 mL | INTRAMUSCULAR | Status: DC

## 2016-11-18 MED ORDER — INSULIN ASPART PROT & ASPART (70-30 MIX) 100 UNIT/ML PEN: 6.0000 [IU] | PEN_INJECTOR | Freq: Two times a day (BID) | SUBCUTANEOUS | 5 refills | Status: DC

## 2016-11-18 NOTE — Patient Instructions (Signed)
Try the nortriptyline every night for a week and see if when you touch the area of your wound if you feel it's helping you. If it is I would continue it indefinitely or even go up on the dosage a little bit. If it's not helping you I would just stop the medicine. Get a 2 pound ankle weight at the store and continue the strengthening exercises (knee extensions, straight leg raises) for at least 6 more weeks. Call me if you have any problems otherwise follow up as needed.

## 2016-11-18 NOTE — Patient Instructions (Signed)
Eat cleanly. Stay on current diabetes medication regimen for now. Check MyChart by Saturday mid morning. Updates with the plan will be detailed there.  Let us know if you need anything.

## 2016-11-18 NOTE — Progress Notes (Signed)
Pre visit review using our clinic review tool, if applicable. No additional management support is needed unless otherwise documented below in the visit note. 

## 2016-11-18 NOTE — Progress Notes (Signed)
Subjective:   Chief Complaint  Patient presents with  . Follow-up    Phillip Richard is a 66 y.o. male here for follow-up of diabetes.   [facility]'s self monitored glucose range is low 100's.  Patient denies hypoglycemic reactions. He checks his glucose levels 2 times per day. Patient does require insulin- Insulin 70/30 8 u BID, decreased from 10 u BID.   Medications include: Metformin 500 mg BID Patient exercises 7 days per week on average.   He does take an aspirin daily. Statin? Yes  Past Medical History:  Diagnosis Date  . Allergic rhinitis   . Diabetes mellitus without complication ([place])   . History of chicken pox   . Hyperlipidemia   . Mumps     Past Surgical History:  Procedure Laterality Date  . HERNIA REPAIR     Umbilical & Inguinal  . WISDOM TOOTH EXTRACTION      Social History   Social History  . Marital status: Single   Social History Main Topics  . Smoking status: Current Some Day Smoker    Packs/day: 0.25    Years: 50.00    Types: Cigarettes, Cigars  . Smokeless tobacco: Never Used  . Alcohol use 1.2 oz/week    2 Cans of beer per week     Comment: occasionally  . Drug use: Yes    Types: "Crack" cocaine     Comment: For pain  . Sexual activity: No   Current Outpatient Prescriptions on File Prior to Visit  Medication Sig Dispense Refill  . atorvastatin (LIPITOR) 40 MG tablet Take 1 tablet (40 mg total) by mouth daily. 90 tablet 3  . glucose blood test strip 1 each by Other route as needed for other. Use as instructed 100 each 1  . insulin aspart protamine - aspart (NOVOLOG 70/30 MIX) (70-30) 100 UNIT/ML FlexPen Inject 0.08 mLs (8 Units total) into the skin 2 (two) times daily. 15 mL 5  . loratadine (CLARITIN) 10 MG tablet Take 1 tablet (10 mg total) by mouth daily. 90 tablet 3  . metFORMIN (GLUCOPHAGE) 500 MG tablet Take 1 tablet (500 mg total) by mouth daily. 90 tablet 1  . naproxen (NAPROSYN) 375 MG tablet Take 1 tablet (375 mg total) by mouth 2  (two) times daily between meals as needed for moderate pain (Severe Pain). 60 tablet 1  . nortriptyline (PAMELOR) 10 MG capsule Take 1 capsule (10 mg total) by mouth at bedtime. 30 capsule 1  . Olopatadine HCl (PATADAY) 0.2 % SOLN Apply 1 drop to eye as needed (eye irritation). 1 drop to each eye 1 Bottle 5   Related testing: Foot exam(monofilament and inspection):done Date of retinal exam: Scheduled with Dr. Thomasene Lot in Jan Pneumovax: Getting PCV13 today Flu Shot: getting today  Review of Systems: Eye:  No recent significant change in vision Pulmonary:  No SOB Cardiovascular:  No chest pain, no palpitations Skin/Integumentary ROS:  No abnormal skin lesions reported Neurologic:  No numbness, tingling  Objective:  BP 132/78 (BP Location: Left Arm, Patient Position: Sitting, Cuff Size: Normal)   Pulse 65   Temp 97.8 F (36.6 C) (Oral)   Ht 5' 10.5" (1.791 m)   Wt 153 lb 2 oz (69.5 kg)   SpO2 98%   BMI 21.66 kg/m  General:  Well developed, well nourished, in no apparent distress Skin:  Warm, no pallor or diaphoresis Head:  Normocephalic, atraumatic Eyes:  Pupils equal and round, sclera anicteric without injection  Nose:  External nares without  trauma, no discharge Throat/Pharynx:  Lips and gingiva without lesion Neck: Neck supple.  No obvious thyromegaly or masses.  No bruits Lungs:  clear to auscultation, breath sounds equal bilaterally, no wheezes, rales, or stridor Cardio:  regular rate and rhythm, no bruits, no LE edema Musculoskeletal:  Symmetrical muscle groups noted without atrophy or deformity Neuro:  Sensation intact to pinprick on feet Psych: Age appropriate judgment and insight  Assessment:   Type 2 diabetes mellitus without complication, with long-term current use of insulin ([place]) - Plan: Hemoglobin A1c, Microalbumin / creatinine urine ratio, HM Diabetes Foot Exam   Plan:   Orders as above. May increase dose of Metformin to 1000 mg BID pending results. I would  like him to come off of insulin altogether if possible. He also would like to come off.  Counseled on diet.  F/u in 3 mo.  The patient voiced understanding and agreement to the plan.  [place], DO 11/18/16 10:49 AM

## 2016-11-18 NOTE — Addendum Note (Signed)
Addended by: Sharon Seller B on: 11/18/2016 11:12 AM   Modules accepted: Orders

## 2016-11-19 ENCOUNTER — Ambulatory Visit: Payer: Medicare Other | Admitting: Family Medicine
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
## 2017-02-18 ENCOUNTER — Telehealth: Payer: Self-pay | Admitting: Family Medicine

## 2017-02-18 MED ORDER — PEN NEEDLES 32G X 5 MM MISC
1.0000 mL | Freq: Once | 1 refills | Status: DC
Start: 2017-02-18 — End: 2018-04-05

## 2017-02-18 NOTE — Telephone Encounter (Signed)
Attempted to call patient back to clarify exactly what he needed.  It looks like the flex pen was written with 5 refills.  So he could just contact pharmacy / No answer, unable to leave message. /

## 2017-02-18 NOTE — Telephone Encounter (Signed)
Sister states the pt needs only the pen needles . Pt has the flex pen, but due to Dr Nani Ravens decreasing the amount of insulin pt has to take, so pt has more insulin left in the pen and not enough needles. Needles must fit the novolog flex pen.  Pt is out of the needles!!  [id number] - [place], [place] - [address] AT [place] [phone] (Phone) [phone] (Fax)

## 2017-02-18 NOTE — Telephone Encounter (Signed)
Copied from [place] [id number]. Topic: Quick Communication - Rx Refill/Question >> Feb 18, 2017  9:17 AM Arletha Grippe wrote: Has the patient contacted their pharmacy? No.   (Agent: If no, request that the patient contact the pharmacy for the refill.)   Preferred Pharmacy (with phone number or street name): pt needs to have needles called in for novalog pen. Dr changed the rx and he ran out of needles that came in the pack with the medicine. He is completely out of needles, needs this today. Pt [facility] [address]. Cb # [phone] , vanessa colyer    Agent: Please be advised that RX refills may take up to 3 business days. We ask that you follow-up with your pharmacy.

## 2017-02-18 NOTE — Addendum Note (Signed)
Addended by: Sharon Seller B on: 02/18/2017 02:14 PM   Modules accepted: Orders

## 2017-02-18 NOTE — Telephone Encounter (Signed)
Sent in pen needles 

## 2017-02-25 ENCOUNTER — Ambulatory Visit: Payer: Medicare Other | Admitting: Family Medicine

## 2017-06-13 ENCOUNTER — Encounter: Payer: Self-pay | Admitting: Family Medicine

## 2017-06-13 ENCOUNTER — Ambulatory Visit (INDEPENDENT_AMBULATORY_CARE_PROVIDER_SITE_OTHER): Payer: Medicare Other | Admitting: Family Medicine

## 2017-06-13 VITALS — BP 136/80 | HR 48 | Temp 98.1°F | Ht 70.0 in | Wt 157.0 lb

## 2017-06-13 DIAGNOSIS — Z794 Long term (current) use of insulin: Secondary | ICD-10-CM | POA: Diagnosis not present

## 2017-06-13 DIAGNOSIS — B353 Tinea pedis: Secondary | ICD-10-CM | POA: Diagnosis not present

## 2017-06-13 DIAGNOSIS — R2681 Unsteadiness on feet: Secondary | ICD-10-CM | POA: Insufficient documentation

## 2017-06-13 DIAGNOSIS — Z23 Encounter for immunization: Secondary | ICD-10-CM | POA: Diagnosis not present

## 2017-06-13 DIAGNOSIS — E119 Type 2 diabetes mellitus without complications: Secondary | ICD-10-CM | POA: Diagnosis not present

## 2017-06-13 DIAGNOSIS — Z1159 Encounter for screening for other viral diseases: Secondary | ICD-10-CM

## 2017-06-13 LAB — HEMOGLOBIN A1C: Hgb A1c MFr Bld: 7.5 % — ABNORMAL HIGH (ref 4.6–6.5)

## 2017-06-13 MED ORDER — KETOCONAZOLE 2 % EX CREA: 1.0000 "application " | TOPICAL_CREAM | Freq: Every day | CUTANEOUS | 1 refills | Status: DC

## 2017-06-13 NOTE — Progress Notes (Signed)
Subjective:   Chief Complaint  Patient presents with  . Follow-up    Phillip Richard is a 67 y.o. male here for follow-up of diabetes.  Here w his sister who helps with the hx.  Phillip Richard's self monitored glucose range was low 100's.  He has not been cking sugars as his battery died on his glucometer. Patient does require insulin.   Medications include: Insulin 70/30 6 units bid, Metformin 500 mg bid Patient exercises 7 days per week on average.   Statin? Yes ACEi/ARB? Yes  Has a painful callus on the top of his R second toe. Nothing draining, no redness or fevers. Has not tried anything. Has been there for a few mo, has not tried anything at home.  Thinks he has athlete's foot between his toes again.   Gait has been unsteady for past couple years, worse over past couple mo. No inj or change in activity. Associated swelling. Was using a cane, now using a walker for past mo.   Past Medical History:  Diagnosis Date  . Allergic rhinitis   . Diabetes mellitus without complication ([place])   . History of chicken pox   . History of lead poisoning   . Hyperlipidemia   . Mumps     Past Surgical History:  Procedure Laterality Date  . HERNIA REPAIR     Umbilical & Inguinal  . WISDOM TOOTH EXTRACTION        Current Outpatient Medications on File Prior to Visit  Medication Sig Dispense Refill  . atorvastatin (LIPITOR) 40 MG tablet Take 1 tablet (40 mg total) by mouth daily. 90 tablet 3  . glucose blood test strip 1 each by Other route as needed for other. Use as instructed 100 each 1  . insulin aspart protamine - aspart (NOVOLOG 70/30 MIX) (70-30) 100 UNIT/ML FlexPen Inject 0.06 mLs (6 Units total) into the skin 2 (two) times daily. 15 mL 5  . loratadine (CLARITIN) 10 MG tablet Take 1 tablet (10 mg total) by mouth daily. 90 tablet 3  . metFORMIN (GLUCOPHAGE) 500 MG tablet Take 1 tablet (500 mg total) by mouth daily. 90 tablet 1  . naproxen (NAPROSYN) 375 MG tablet Take 1 tablet (375 mg  total) by mouth 2 (two) times daily between meals as needed for moderate pain (Severe Pain). 60 tablet 1  . nortriptyline (PAMELOR) 10 MG capsule Take 1 capsule (10 mg total) by mouth at bedtime. 30 capsule 1  . Olopatadine HCl (PATADAY) 0.2 % SOLN Apply 1 drop to eye as needed (eye irritation). 1 drop to each eye 1 Bottle 5   No Known Allergies  Related testing: Foot exam(monofilament and inspection):done Date of retinal exam: tomorrow  Done by:  Dr. Katy Richard Pneumovax: done Flu Shot: done  Review of Systems: Skin: +callus and athlete's foot Neuro: +unsteady gait  Objective:  BP 136/80 (BP Location: Left Arm, Patient Position: Sitting, Cuff Size: Normal)   Pulse (!) 48   Temp 98.1 F (36.7 C) (Oral)   Ht 5\' 10"  (1.778 m)   Wt 157 lb (71.2 kg)   SpO2 97%   BMI 22.53 kg/m  General:  Well developed, well nourished, in no apparent distress Skin:  See below; macerated tissues b/t toes b/l Head:  Normocephalic, atraumatic Eyes:  Pupils equal and round, sclera anicteric without injection  Nose:  External nares without trauma, no discharge Throat/Pharynx:  Lips and gingiva without lesion Neck: Neck supple.  No obvious thyromegaly or masses.  No bruits Lungs:  clear to auscultation, breath sounds equal bilaterally, no wheezes, rales, or stridor Neuro: Gait is slow and unsteady Cardio:  regular rate and rhythm without murmurs, no bruits, no LE edema Neuro:  Sensation intact to pinprick on feet Psych: Nml mood and affect      Assessment:   Type 2 diabetes mellitus without complication, with long-term current use of insulin ([place]) - Plan: HM DIABETES FOOT EXAM, Hemoglobin A1c  Unsteady gait - Plan: Ambulatory referral to Home Health  Encounter for hepatitis C screening test for low risk patient - Plan: Hepatitis C antibody  Tinea pedis of both feet - Plan: ketoconazole (NIZORAL) 2 % cream  Need for tetanus booster - Plan: Tdap vaccine greater than or equal to 7yo IM   Plan:    Orders as above. We gave him a new monitor today to ck sugars, will decrease insulin pending A1c.  Refer home health given his gait.  Cream for foot. Update imms.  Sister requesting to hold of on colonoscopy.  F/u in 3 mo. The patient voiced understanding and agreement to the plan.  [place], DO 06/13/17 9:53 AM

## 2017-06-13 NOTE — Patient Instructions (Addendum)
Soak your feet and use a pumice stone or foot file on your callus. If it continues to bother you, let us know and we can get you in with a foot doctor.   Make sure you use the cream daily for 6 weeks.   Let us know when you are ready for the colonoscopy via MyChart.  Let us know if you need anything.

## 2017-06-13 NOTE — Progress Notes (Signed)
Pre visit review using our clinic review tool, if applicable. No additional management support is needed unless otherwise documented below in the visit note. 

## 2017-06-14 ENCOUNTER — Other Ambulatory Visit: Payer: Self-pay | Admitting: Family Medicine

## 2017-06-14 DIAGNOSIS — E119 Type 2 diabetes mellitus without complications: Secondary | ICD-10-CM | POA: Diagnosis not present

## 2017-06-14 DIAGNOSIS — H2513 Age-related nuclear cataract, bilateral: Secondary | ICD-10-CM | POA: Diagnosis not present

## 2017-06-14 DIAGNOSIS — H353131 Nonexudative age-related macular degeneration, bilateral, early dry stage: Secondary | ICD-10-CM | POA: Diagnosis not present

## 2017-06-14 DIAGNOSIS — Z794 Long term (current) use of insulin: Principal | ICD-10-CM

## 2017-06-14 LAB — HEPATITIS C ANTIBODY
Hepatitis C Ab: NONREACTIVE
SIGNAL TO CUT-OFF: 0.02 (ref <1.00)

## 2017-06-14 NOTE — Progress Notes (Signed)
Going back to 8 u bid from 6 u.

## 2017-06-20 ENCOUNTER — Encounter: Payer: Self-pay | Admitting: Family Medicine

## 2017-09-28 ENCOUNTER — Encounter: Payer: Self-pay | Admitting: Family Medicine

## 2017-10-05 ENCOUNTER — Telehealth: Payer: Self-pay | Admitting: Family Medicine

## 2017-10-05 NOTE — Telephone Encounter (Signed)
Copied from [place] [id number]. Topic: General - Other >> Oct 05, 2017  8:35 AM Yvette Rack wrote: Reason for CRM: pt sister Phillip Richard calling wanting Phillip Richard to fill out a FL2 she is wanting to place him in a assistant living facility please give sister Phillip Richard a call back at work at [phone]

## 2017-10-05 NOTE — Telephone Encounter (Signed)
Called the patients sister and informed to bring it in and PCP will complete if able to without seeing him

## 2017-10-05 NOTE — Telephone Encounter (Signed)
OK to drop off, may need to see him. TY.

## 2017-10-05 NOTE — Telephone Encounter (Signed)
Why?  

## 2017-10-05 NOTE — Telephone Encounter (Signed)
Spoke to the sister. The family does not feel he is capable of living alone anymore..  They are mostly concerned regarding his stability...the patient has seen the facility and very happy with the location. He is able to dress/eat but needs someone there when he showers/and at night getting up to go to the bathroom. Can she just drop off the form to be completed-----they will need the form as soon as possible to get things going/completed.  They have already a nurse visit appt. Scheduled for the august 13th to get his TB skin test done.

## 2017-10-11 ENCOUNTER — Ambulatory Visit: Payer: Medicare Other

## 2017-11-16 ENCOUNTER — Ambulatory Visit (INDEPENDENT_AMBULATORY_CARE_PROVIDER_SITE_OTHER): Payer: Medicare Other | Admitting: Family Medicine

## 2017-11-16 ENCOUNTER — Encounter: Payer: Self-pay | Admitting: Family Medicine

## 2017-11-16 VITALS — BP 140/86 | HR 61 | Temp 97.9°F | Ht 70.0 in | Wt 171.5 lb

## 2017-11-16 DIAGNOSIS — L089 Local infection of the skin and subcutaneous tissue, unspecified: Secondary | ICD-10-CM | POA: Diagnosis not present

## 2017-11-16 MED ORDER — CEFTRIAXONE SODIUM 1 G IJ SOLR
1.0000 g | Freq: Once | INTRAMUSCULAR | Status: AC
  Administered 2017-11-16: 1 g via INTRAMUSCULAR

## 2017-11-16 MED ORDER — CLINDAMYCIN HCL 300 MG PO CAPS: 300.0000 mg | ORAL_CAPSULE | Freq: Three times a day (TID) | ORAL | 0 refills | Status: DC

## 2017-11-16 NOTE — Progress Notes (Signed)
Pre visit review using our clinic review tool, if applicable. No additional management support is needed unless otherwise documented below in the visit note. 

## 2017-11-16 NOTE — Patient Instructions (Addendum)
Fevers, worsening pain or worsening of your lesion should prompt seeking care in ED.  We will be in touch regarding lab results.  Try to keep the area clean and dry. Do iodine/betadine soaks twice daily until we see each other next. Soak for 10-15 minutes.  Let us know if you need anything.

## 2017-11-16 NOTE — Progress Notes (Signed)
Chief Complaint  Patient presents with  . Leg Pain    right  . Foot Pain    Phillip Richard is a 67 y.o. male here for a skin complaint.  Duration: several days (unsure how long exactly) Location: bottom of R foot Pruritic? No Painful? Yes Drainage? Yes New soaps/lotions/topicals/detergents? No Sick contacts? No Other associated symptoms: None Therapies tried thus far: None  ROS:  Const: No fevers Skin: As noted in HPI  Past Medical History:  Diagnosis Date  . Allergic rhinitis   . Diabetes mellitus without complication ([place])   . History of chicken pox   . History of lead poisoning   . Hyperlipidemia   . Mumps    No Known Allergies   BP 140/86 (BP Location: Left Arm, Patient Position: Sitting, Cuff Size: Normal)   Pulse 61   Temp 97.9 F (36.6 C) (Oral)   Ht 5\' 10"  (1.778 m)   Wt 77.8 kg   SpO2 99%   BMI 24.61 kg/m  Gen: awake, alert, appearing stated age Lungs: No accessory muscle use Skin: see below. + drainage, -erythema, +TTP, minimal fluctuance, -excoriation Psych: Age appropriate judgment and insight      Pustular lesion - Plan: Sedimentation rate, automated, C-reactive protein, CBC, clindamycin (CLEOCIN) 300 MG capsule  Orders as above. Rocephin today. BID 10-15 min iodine soaks. Ck osteo labs. Will send to ED if needed. F/u in 5 d, if no improvement, will consider ED vs urgent podiatry referral. The patient voiced understanding and agreement to the plan.  [place], DO 11/16/17 11:37 AM

## 2017-11-17 ENCOUNTER — Other Ambulatory Visit (INDEPENDENT_AMBULATORY_CARE_PROVIDER_SITE_OTHER): Payer: Medicare Other

## 2017-11-17 DIAGNOSIS — L089 Local infection of the skin and subcutaneous tissue, unspecified: Secondary | ICD-10-CM

## 2017-11-17 LAB — CBC
HEMATOCRIT: 39.6 % (ref 39.0–52.0)
Hemoglobin: 12.9 g/dL — ABNORMAL LOW (ref 13.0–17.0)
MCHC: 32.7 g/dL (ref 30.0–36.0)
MCV: 86.2 fl (ref 78.0–100.0)
Platelets: 151 10*3/uL (ref 150.0–400.0)
RBC: 4.59 Mil/uL (ref 4.22–5.81)
RDW: 15.1 % (ref 11.5–15.5)
WBC: 8.9 10*3/uL (ref 4.0–10.5)

## 2017-11-17 LAB — SEDIMENTATION RATE: SED RATE: 7 mm/h (ref 0–20)

## 2017-11-17 LAB — C-REACTIVE PROTEIN: CRP: 0.5 mg/dL (ref 0.5–20.0)

## 2017-11-21 ENCOUNTER — Other Ambulatory Visit: Payer: Self-pay | Admitting: Family Medicine

## 2017-11-21 ENCOUNTER — Telehealth: Payer: Self-pay

## 2017-11-21 ENCOUNTER — Encounter: Payer: Self-pay | Admitting: Family Medicine

## 2017-11-21 ENCOUNTER — Telehealth: Payer: Self-pay | Admitting: *Deleted

## 2017-11-21 ENCOUNTER — Ambulatory Visit (INDEPENDENT_AMBULATORY_CARE_PROVIDER_SITE_OTHER): Payer: Medicare Other | Admitting: Family Medicine

## 2017-11-21 VITALS — BP 142/70 | HR 61 | Temp 98.1°F | Ht 71.0 in | Wt 160.2 lb

## 2017-11-21 DIAGNOSIS — L853 Xerosis cutis: Secondary | ICD-10-CM

## 2017-11-21 DIAGNOSIS — L600 Ingrowing nail: Secondary | ICD-10-CM | POA: Diagnosis not present

## 2017-11-21 DIAGNOSIS — L089 Local infection of the skin and subcutaneous tissue, unspecified: Secondary | ICD-10-CM

## 2017-11-21 DIAGNOSIS — Z111 Encounter for screening for respiratory tuberculosis: Secondary | ICD-10-CM

## 2017-11-21 DIAGNOSIS — M79604 Pain in right leg: Secondary | ICD-10-CM

## 2017-11-21 DIAGNOSIS — Z794 Long term (current) use of insulin: Secondary | ICD-10-CM

## 2017-11-21 DIAGNOSIS — E119 Type 2 diabetes mellitus without complications: Secondary | ICD-10-CM

## 2017-11-21 NOTE — Telephone Encounter (Signed)
Received this message after hours re: sister's request for PPD for pt. Seen today. Routed to Dr. Nani Ravens for approval.  Copied from [place] [id number]. Topic: General - Other >> Nov 21, 2017 10:15 AM Judyann Munson wrote: Reason for CRM: pt sister Lorriane Shire calling wanting a TB test completed for the patient. She is hoping to have this completed on his appt schedule for today. She stated she spoke with robin in regards to this. The patient is needing this shot for his placement in a assistant living facility please give sister Lorriane Shire a call back at work at [phone].

## 2017-11-21 NOTE — Progress Notes (Signed)
Chief Complaint  Patient presents with  . Follow-up    Pt here for f/u visit.     Phillip Richard is a 67 y.o. male here for a skin complaint.  Found to have a pustular lesion on plantar surface of R foot. He had pain and was started on Clinda. He has been compliant. Reports near resolution. No AE's w med. No drainage. Is keeping wrapped and dry. Is soaking.  Has an ingrown toenail that he would like removed. It is painful. He does not want it to come back.   ROS:  Const: No fevers Skin: As noted in HPI  Past Medical History:  Diagnosis Date  . Allergic rhinitis   . Diabetes mellitus without complication ([place])   . History of chicken pox   . History of lead poisoning   . Hyperlipidemia   . Mumps    No Known Allergies   BP (!) 142/70 (BP Location: Left Arm, Patient Position: Sitting, Cuff Size: Normal)   Pulse 61   Temp 98.1 F (36.7 C) (Oral)   Ht 5\' 11"  (1.803 m)   Wt 160 lb 3.2 oz (72.7 kg)   SpO2 100%   BMI 22.34 kg/m  Gen: awake, alert, appearing stated age Lungs: No accessory muscle use Skin: No fluctuance, ttp, erythema, drainage on plantar surface of foot. Skin is scaly and dry. +thickened and dystrophic great R toenail that appears to be digging into periungual skin. No signs of infection or drainage. Psych: Nml affect and mood.   Pustular lesion - Plan: Tdap vaccine greater than or equal to 7yo IM  Dry skin dermatitis  Ingrown toenail of right foot  Finish clinda. Looks much better. Hold off on further labs and/or imaging. Apply lotion to feet. F/u in 2 weeks for toenail removal. The patient voiced understanding and agreement to the plan.  [place], DO 11/21/17 2:47 PM

## 2017-11-21 NOTE — Telephone Encounter (Signed)
Sorry this was not brought to my attention until well after his appointment. OK to schedule nurse visit at his earliest convenience, though Tuesday would allow for more flexible return for reading.

## 2017-11-21 NOTE — Patient Instructions (Addendum)
Finish the antibiotics.   Put lotion on your foot at least twice daily.   Keep the area clean and dry.   We don't need to get any imaging since you are doing better.  Let us know if you need anything.

## 2017-11-21 NOTE — Telephone Encounter (Signed)
Completed as much as possible on FL2, attached Med list, Demographics and Referal made for Home Health; forwarded to provider for pt's OV today/SLS 09/23

## 2017-11-22 ENCOUNTER — Ambulatory Visit: Payer: Medicare Other

## 2017-11-22 NOTE — Addendum Note (Signed)
Addended by: Raynelle Dick R on: 11/22/2017 09:23 AM   Modules accepted: Orders

## 2017-11-22 NOTE — Telephone Encounter (Signed)
Chief Strategy Officer phoned daughter, Lorriane Shire. Lorriane Shire stated she or her brother would be available to take pt. This afternoon for PPD placement, and stated that either of them could bring pt. back Thursday or Friday afternoon for PPD read. PPD order placed per Dr. Nani Ravens, and NV appointment made for 9/24 at [facility].

## 2017-11-29 ENCOUNTER — Telehealth (INDEPENDENT_AMBULATORY_CARE_PROVIDER_SITE_OTHER): Payer: Medicare Other

## 2017-11-29 ENCOUNTER — Ambulatory Visit: Payer: Medicare Other

## 2017-11-29 DIAGNOSIS — Z111 Encounter for screening for respiratory tuberculosis: Secondary | ICD-10-CM

## 2017-11-29 NOTE — Telephone Encounter (Signed)
Copied from [place] [id number]. Topic: General - Other >> Nov 29, 2017  1:16 PM Bea Graff, NT wrote: Reason for CRM: Saddie Benders.Silverio Lay requesting a call from [place] as soon as possible since her brother will be in the office in 30 mins. Also she is requesting the paperwork back today in order to get her brother in assisted living.

## 2017-11-29 NOTE — Telephone Encounter (Signed)
Author phoned Lorriane Shire. No answer. Author left detailed VM, reminding daughter about 10/3 appointment with the nurse, and stating that she can follow-up with Robin tmr. Re: status of ppwk.

## 2017-11-30 NOTE — Telephone Encounter (Signed)
I let Santiago Glad know on Monday that patient would be in for PPD placement and that there was nowhere on FL2 to document it, so we would just wait until read and then do PPD Letter from Epic; I had not heard anything different until now, so unless I hear different today, the paperwork  [FL2 & PPD letter] will all be ready tomorrow when pt comes in for reading/SLS 10/02

## 2017-12-01 ENCOUNTER — Ambulatory Visit: Payer: Medicare Other

## 2017-12-01 DIAGNOSIS — Z111 Encounter for screening for respiratory tuberculosis: Secondary | ICD-10-CM

## 2017-12-01 LAB — TB SKIN TEST
INDURATION: 0 mm
TB SKIN TEST: NEGATIVE

## 2017-12-01 NOTE — Addendum Note (Signed)
Addended by: Bunnie Domino on: 12/01/2017 02:51 PM   Modules accepted: Orders

## 2017-12-16 ENCOUNTER — Ambulatory Visit (INDEPENDENT_AMBULATORY_CARE_PROVIDER_SITE_OTHER): Payer: Medicare Other | Admitting: Family Medicine

## 2017-12-16 ENCOUNTER — Encounter: Payer: Self-pay | Admitting: Family Medicine

## 2017-12-16 VITALS — BP 130/80 | HR 58 | Temp 97.9°F | Ht 71.0 in | Wt 170.2 lb

## 2017-12-16 DIAGNOSIS — Z23 Encounter for immunization: Secondary | ICD-10-CM | POA: Diagnosis not present

## 2017-12-16 DIAGNOSIS — Z1211 Encounter for screening for malignant neoplasm of colon: Secondary | ICD-10-CM | POA: Diagnosis not present

## 2017-12-16 DIAGNOSIS — Z794 Long term (current) use of insulin: Secondary | ICD-10-CM | POA: Diagnosis not present

## 2017-12-16 DIAGNOSIS — L600 Ingrowing nail: Secondary | ICD-10-CM | POA: Diagnosis not present

## 2017-12-16 DIAGNOSIS — E119 Type 2 diabetes mellitus without complications: Secondary | ICD-10-CM

## 2017-12-16 DIAGNOSIS — E785 Hyperlipidemia, unspecified: Secondary | ICD-10-CM | POA: Diagnosis not present

## 2017-12-16 LAB — HEMOGLOBIN A1C: Hgb A1c MFr Bld: 6.8 % — ABNORMAL HIGH (ref 4.6–6.5)

## 2017-12-16 LAB — LIPID PANEL
CHOL/HDL RATIO: 2
Cholesterol: 95 mg/dL (ref 0–200)
HDL: 45.9 mg/dL (ref >39.00)
LDL CALC: 41 mg/dL (ref 0–99)
NONHDL: 48.99
Triglycerides: 38 mg/dL (ref 0.0–149.0)
VLDL: 7.6 mg/dL (ref 0.0–40.0)

## 2017-12-16 LAB — COMPREHENSIVE METABOLIC PANEL
ALK PHOS: 72 U/L (ref 39–117)
ALT: 41 U/L (ref 0–53)
AST: 34 U/L (ref 0–37)
Albumin: 3.8 g/dL (ref 3.5–5.2)
BILIRUBIN TOTAL: 0.4 mg/dL (ref 0.2–1.2)
BUN: 20 mg/dL (ref 6–23)
CALCIUM: 9.4 mg/dL (ref 8.4–10.5)
CO2: 27 mEq/L (ref 19–32)
CREATININE: 1.11 mg/dL (ref 0.40–1.50)
Chloride: 106 mEq/L (ref 96–112)
GFR: 69.73 mL/min (ref >60.00)
GLUCOSE: 89 mg/dL (ref 70–99)
Potassium: 4.5 mEq/L (ref 3.5–5.1)
Sodium: 140 mEq/L (ref 135–145)
TOTAL PROTEIN: 6.7 g/dL (ref 6.0–8.3)

## 2017-12-16 LAB — CBC
HCT: 40.3 % (ref 39.0–52.0)
Hemoglobin: 13.3 g/dL (ref 13.0–17.0)
MCHC: 33 g/dL (ref 30.0–36.0)
MCV: 86.8 fl (ref 78.0–100.0)
Platelets: 153 10*3/uL (ref 150.0–400.0)
RBC: 4.64 Mil/uL (ref 4.22–5.81)
RDW: 15 % (ref 11.5–15.5)
WBC: 7.6 10*3/uL (ref 4.0–10.5)

## 2017-12-16 LAB — MICROALBUMIN / CREATININE URINE RATIO
CREATININE, U: 122.4 mg/dL
Microalb Creat Ratio: 2 mg/g (ref 0.0–30.0)
Microalb, Ur: 2.5 mg/dL — ABNORMAL HIGH (ref 0.0–1.9)

## 2017-12-16 NOTE — Progress Notes (Signed)
Pre visit review using our clinic review tool, if applicable. No additional management support is needed unless otherwise documented below in the visit note. 

## 2017-12-16 NOTE — Progress Notes (Signed)
Subjective:   Chief Complaint  Patient presents with  . Follow-up    Phillip Richard is a 67 y.o. male here for follow-up of diabetes.  Here with borther Phillip Richard's self monitored glucose range is 90's-low 100's Patient denies hypoglycemic reactions. He checks his glucose levels 2 times per day. Patient does require insulin.  70-30 6 u bid. Medications include: Metformin 500 mg/d Exercise: walking  Hyperlipidemia Patient presents for dyslipidemia follow up. Currently being treated with Lipitor 40 mg/d and compliance with treatment thus far has been good. He denies myalgias. He is not always adhering to a healthy. Exercise: walking The patient is not known to have coexisting coronary artery disease.  Ingrown toenail seen by me last visit. He would like it off. Asking of podiatry is best course. Is having some pain.   Past Medical History:  Diagnosis Date  . Allergic rhinitis   . Diabetes mellitus without complication ([place])   . History of chicken pox   . History of lead poisoning   . Hyperlipidemia   . Mumps      Related testing: Date of retinal exam: 06/18/17 Pneumovax: done Flu Shot: done  Review of Systems: Pulmonary:  No SOB Cardiovascular:  No chest pain  Objective:  BP 130/80 (BP Location: Left Arm, Patient Position: Sitting, Cuff Size: Normal)   Pulse (!) 58   Temp 97.9 F (36.6 C) (Oral)   Ht 5\' 11"  (1.803 m)   Wt 170 lb 4 oz (77.2 kg)   SpO2 96%   BMI 23.75 kg/m  General:  Well developed, well nourished, in no apparent distress Skin:  Warm, no pallor or diaphoresis Head:  Normocephalic, atraumatic Eyes:  Pupils equal and round, sclera anicteric without injection  Lungs:  CTAB, no access msc use Cardio:  RRR, no bruits, no LE edema Psych: Nml affect and mood  Assessment:   Type 2 diabetes mellitus without complication, with long-term current use of insulin ([facility]) - Plan: Comprehensive metabolic panel, Hemoglobin A1c, Microalbumin / creatinine urine  ratio, Lipid panel, CBC  Dyslipidemia  Screen for colon cancer - Plan: Ambulatory referral to Gastroenterology  Ingrown toenail  Need for influenza vaccination - Plan: Flu vaccine HIGH DOSE PF (Fluzone High dose)  Need for vaccination against Streptococcus pneumoniae - Plan: Pneumococcal polysaccharide vaccine 23-valent greater than or equal to 2yo subcutaneous/IM   Plan:   Orders as above. Counseled on diet and exercise. Will officially have him on 6 u bid until results of A1c back. Eye exam coming up in Nov.  Cont statin. Refer to GI.  Will refer to podiatry if A1c uncontrolled. Otherwise I can remove nail plate.  F/u in 6 mo otherwise. The patient and his brother voiced understanding and agreement to the plan.  [place], DO 12/16/17 9:52 AM

## 2017-12-16 NOTE — Patient Instructions (Addendum)
If you do not hear anything about your referral in the next 1-2 weeks, call our office and ask for an update.  Give [place] 2-3 business days to get the results of your labs back.   Stay active, keep the diet clean.  We had wanted you on 8 units of the insulin twice daily, but let's see what the labs are before officially saying 6 or 8 units.   Depending on your A1c, we may send you to a foot doctor or take it off here.   Let us know if you need anything.

## 2017-12-20 ENCOUNTER — Telehealth: Payer: Self-pay | Admitting: *Deleted

## 2017-12-20 ENCOUNTER — Telehealth: Payer: Self-pay | Admitting: Family Medicine

## 2017-12-20 NOTE — Telephone Encounter (Signed)
Received Physician Orders/FL2 Addendum from [facility]; forwarded to provider/SLS 10/22

## 2017-12-20 NOTE — Telephone Encounter (Signed)
Copied from [place] [id number]. Topic: General - Other >> Dec 20, 2017 11:20 AM Lennox Solders wrote: Reason for CRM: [facility]y [facility] is calling and would like a copy of office visit fax to [phone] and phone [phone] Pt saw dr Nani Ravens on 12/16/17

## 2017-12-21 NOTE — Telephone Encounter (Signed)
Completed paperwork with Immunization record attached faxed with confirmation/SLS 10/23

## 2017-12-21 NOTE — Telephone Encounter (Signed)
Faxed over notes requested.

## 2017-12-22 ENCOUNTER — Encounter: Payer: Self-pay | Admitting: Family Medicine

## 2017-12-22 ENCOUNTER — Ambulatory Visit (INDEPENDENT_AMBULATORY_CARE_PROVIDER_SITE_OTHER): Payer: Medicare Other | Admitting: Family Medicine

## 2017-12-22 ENCOUNTER — Telehealth: Payer: Self-pay | Admitting: *Deleted

## 2017-12-22 ENCOUNTER — Telehealth: Payer: Self-pay | Admitting: Family Medicine

## 2017-12-22 DIAGNOSIS — L600 Ingrowing nail: Secondary | ICD-10-CM | POA: Diagnosis not present

## 2017-12-22 MED ORDER — TRAMADOL HCL 50 MG PO TABS: 50.0000 mg | ORAL_TABLET | Freq: Three times a day (TID) | ORAL | 0 refills | Status: DC | PRN

## 2017-12-22 NOTE — Telephone Encounter (Signed)
Received Physician Orders from [facility]; forwarded to provider/SLS 10/24

## 2017-12-22 NOTE — Telephone Encounter (Signed)
Copied from [place] [id number]. Topic: Quick Communication - See Telephone Encounter >> Dec 22, 2017  9:17 AM Bea Graff, NT wrote: CRM for notification. See Telephone encounter for: 12/22/17. Pts sister calling to request the loratadine (CLARITIN) 10 MG tablet and the ketoconazole (NIZORAL) 2 % cream to be written on his chart to use PRN. She states he is in a assisted living and they need this to say that. She would like to try and see if this can be updated before his appt today so they can get a print out.

## 2017-12-22 NOTE — Telephone Encounter (Signed)
Updated list as requested to daily prn.

## 2017-12-22 NOTE — Patient Instructions (Addendum)
Do not shower for the rest of the day. When you do wash it, use only soap and water. Do not vigorously scrub. Keep the area clean and dry.   Twice daily soaks in betadine and warm water for 15 minutes. This is imperative!  Ibuprofen 400-600 mg (2-3 over the counter strength tabs) every 6 hours as needed for pain.  OK to take Tylenol 1000 mg (2 extra strength tabs) or 975 mg (3 regular strength tabs) every 6 hours as needed.  Do not drink alcohol, do any illicit/street drugs, drive or do anything that requires alertness while on this medicine (tramadol).  Things to look out for: increasing pain not relieved by ibuprofen/acetaminophen, fevers, spreading redness, drainage of pus, or foul odor.  Fingernail or Toenail Removal, Adult, Care After This sheet gives you information about how to care for yourself after your procedure. Your health care provider may also give you more specific instructions. If you have problems or questions, contact your health care provider. What can I expect after the procedure? After the procedure, it is common to have:  Pain.  Redness.  Swelling.  Soreness.  Follow these instructions at home:  If you have a splint: ? Do not put pressure on any part of the splint until it is fully hardened. This may take several hours. ? Wear the splint as told by your health care provider. Remove it only as told by your health care provider. ? Loosen the splint if your fingers or toes tingle, become numb, or turn cold and blue. ? Keep the splint clean. ? If the splint is not waterproof:  Do not let it get wet.  Cover it with a watertight covering when you take a bath or a shower. Wound care   Follow instructions from your health care provider about how to take care of your wound. Make sure you: ? Wash your hands with soap and water before you change your bandage (dressing). If soap and water are not available, use hand sanitizer. ? Change your dressing as told by  your health care provider. ? Keep your dressing dry until your health care provider says it can be removed. ? Leave stitches (sutures), skin glue, or adhesive strips in place. These skin closures may need to stay in place for 2 weeks or longer. If adhesive strip edges start to loosen and curl up, you may trim the loose edges. Do not remove adhesive strips completely unless your health care provider tells you to do that.  Check your wound every day for signs of infection. Check for: ? More redness, swelling, or pain. ? More fluid or blood. ? Warmth. ? Pus or a bad smell. Managing pain, stiffness, and swelling  Move your fingers or toes often to avoid stiffness and to lessen swelling.  Raise (elevate) the injured area above the level of your heart while you are sitting or lying down. You may need to keep your finger or toe raised or supported on a pillow for 24 hours or as told by your health care provider.  Soak your hand or foot in warm, soapy water for 10-20 minutes, 3 times a day or as told by your health care provider. Medicine  Take over-the-counter and prescription medicines only as told by your health care provider.  If you were prescribed an antibiotic medicine, use it as told by your health care provider. Do not stop using the antibiotic even if your condition improves. General instructions  If you were given a  shoe to wear, wear it as told by your health care provider.  Keep all follow-up visits as told by your health care provider. This is important. Contact a health care provider if:  You have more redness, swelling, or pain around your wound.  You have more fluid or blood coming from your wound.  Your wound feels warm to the touch.  You have pus or a bad smell coming from your wound.  You have a fever.  Your finger or toe looks blue or black. This information is not intended to replace advice given to you by your health care provider. Make sure you discuss any  questions you have with your health care provider. Document Released: 03/08/2014 Document Revised: 10/15/2015 Document Reviewed: 08/25/2015 Elsevier Interactive Patient Education  [employer].

## 2017-12-22 NOTE — Progress Notes (Signed)
Chief Complaint  Patient presents with  . Procedure   Pt here for great toenail removal. Here with brother.   Gen- awake Skin- See below. No erythema or excessive warmth, no erythema. Psych- Nml mood and affect.  Procedure note; complete toenail removal Informed consent obtained. The area was anesthetized with a four corner ring block using 1.5 mL of 2% lidocaine without epinephrine in each quadrant, a total of 6 mL. A tourniquet was placed around the base of the toe. Several minutes past while the initial anesthesia started to work. A small spatula was used to elevate the nail bed and disrupt the matrix. A straight hemostat was then use to wrest the nail from place. The area was the cauterized at the base to ensure no return of the nail.  Adequate hemostasis was obtained and the tourniquet was removed. The area was bandaged.  The patient tolerated the procedure well. There were no complications noted.  Ingrown toenail - Plan: traMADol (ULTRAM) 50 MG tablet, PR REMOVAL OF NAIL PLATE   Aftercare instructions given. Ibuprofen, Tylenol, soaks. Tramadol for breakthrough pain.  Warning signs and symptoms verbalized and written down in AVS.  Fu in 1 week.  Pt and brother voiced understanding and agreement to the plan.  Phillip Richard 4:52 PM 12/22/17

## 2017-12-23 ENCOUNTER — Telehealth: Payer: Self-pay | Admitting: *Deleted

## 2017-12-23 NOTE — Telephone Encounter (Signed)
Received Physician Orders from [facility]; forwarded to provider/SLS10/25

## 2017-12-27 ENCOUNTER — Other Ambulatory Visit: Payer: Self-pay | Admitting: Family Medicine

## 2017-12-29 ENCOUNTER — Encounter: Payer: Self-pay | Admitting: Family Medicine

## 2017-12-29 ENCOUNTER — Ambulatory Visit (INDEPENDENT_AMBULATORY_CARE_PROVIDER_SITE_OTHER): Payer: Medicare Other | Admitting: Family Medicine

## 2017-12-29 VITALS — BP 132/82 | HR 76 | Temp 98.6°F | Ht 71.0 in | Wt 172.0 lb

## 2017-12-29 DIAGNOSIS — L600 Ingrowing nail: Secondary | ICD-10-CM

## 2017-12-29 NOTE — Progress Notes (Signed)
Pre visit review using our clinic review tool, if applicable. No additional management support is needed unless otherwise documented below in the visit note. 

## 2017-12-29 NOTE — Patient Instructions (Signed)
Make sure you do iodine soaks. Mix iodine with water and soak the foot for 15 minutes twice daily. Keep the area clean and dry. It looks like it is healing well though.   Ice and Tylenol for pain.   Let us know if you need anything.

## 2017-12-29 NOTE — Progress Notes (Signed)
Chief Complaint  Patient presents with  . Follow-up    Subjective: Patient is a 67 y.o. male here for nail avulsion f/u.  Did not soak, pain getting better but still there. No fevers or drainage.   Past Medical History:  Diagnosis Date  . Allergic rhinitis   . Diabetes mellitus without complication ([place])   . History of chicken pox   . History of lead poisoning   . Hyperlipidemia   . Mumps     Objective: BP 132/82 (BP Location: Left Arm, Patient Position: Sitting, Cuff Size: Normal)   Pulse 76   Temp 98.6 F (37 C) (Oral)   Ht 5\' 11"  (1.803 m)   Wt 172 lb (78 kg)   SpO2 97%   BMI 23.99 kg/m  General: Awake, appears stated age Skin: R great toe without nail plate, excoriated area over nail bed without evidence of edema, fluctuance, excessive warmth, drainage Lungs: No accessory muscle use Psych: normal affect and mood  Assessment and Plan: Ingrown toenail  Needs to do iodine soaks, not dressings. This was verbalized several times and written down. He is unsure where the previous paperwork I gave him is which also stated these recs. It looks like it is healing well overall, but pain is not improving as fast due to noncompliance. F/u prn.  The patient and his brother voiced understanding and agreement to the plan.  [place], DO 12/29/17  9:39 AM

## 2018-01-06 ENCOUNTER — Telehealth: Payer: Self-pay | Admitting: *Deleted

## 2018-01-06 NOTE — Telephone Encounter (Signed)
Received Physician Orders/Care Plan from [facility]; forwarded to provider/SLS 11/08

## 2018-01-06 NOTE — Telephone Encounter (Signed)
Received Physician Orders from [facility] for Tramadol prescription to be completed; forwarded to provider/SLS 11/08

## 2018-01-09 ENCOUNTER — Telehealth: Payer: Self-pay | Admitting: *Deleted

## 2018-01-09 NOTE — Telephone Encounter (Signed)
Received Physician Orders from [facility]; forwarded to provider/SLS 11/11

## 2018-01-11 ENCOUNTER — Telehealth: Payer: Self-pay

## 2018-01-11 NOTE — Telephone Encounter (Signed)
Reviewed form that patients sister called regarding. The note I made was regarding patients negative PPD at bottom of page.. I did not complete the remaining portions of the form in error as stated in the phone call from patients sister.

## 2018-01-11 NOTE — Telephone Encounter (Signed)
Santiago Glad gave Chief Strategy Officer form to review. Author phoned [facility] and was put on hold for 20 minutes. Chief Strategy Officer then phoned [facility] again, and staff said they would leave a message with appropriate party. Staff confirmed that pt. Is still at the facility, and there is no knowledge on her end about needing an edited FL2 form. Awaiting call-back for confirmation.

## 2018-01-11 NOTE — Telephone Encounter (Signed)
Chief Strategy Officer received return call from [employer], health and Doctor, general practice at [facility], who stated that the recommended level of care they have on file says 'home' with 'assisted living' written beside it and "that is perfectly fine, we don't need anything else". Author phoned sister to notify, no answer. Author left detailed VM on vanessa's work phone requesting return call if any questions.

## 2018-01-11 NOTE — Telephone Encounter (Signed)
Copied from [place] [id number]. Topic: General - Other >> Jan 03, 2018  9:11 AM Vernona Rieger wrote: Reason for CRM: Lorriane Shire ( sister ) called and said that the [facility] paper work that Vernie Shanks filled out was filled out incorrectly on option number 11 on the paperwork. She said that it was checked " home and wrote beside of it , assisted living " she said that the facility is giving her a hard time because it needs to be checked on the " domiciliary, rest home " she states she needs this correct asap or he will be removed and kicked out of the facility today. She really would like this faxed to her at [phone]. She said she really needs this within the hour. Please contact her as soon as you can to let her now that it has been done, thanks so much.

## 2018-01-13 ENCOUNTER — Other Ambulatory Visit: Payer: Self-pay

## 2018-01-17 ENCOUNTER — Telehealth: Payer: Self-pay | Admitting: *Deleted

## 2018-01-17 ENCOUNTER — Ambulatory Visit: Payer: Self-pay | Admitting: *Deleted

## 2018-01-17 NOTE — Telephone Encounter (Signed)
Received Physician Orders from [facility]; forwarded to provider/SLS 11/19

## 2018-01-17 NOTE — Telephone Encounter (Signed)
Pt called asking about taking  naproxen. Per his chart he can take this medicine twice a day between meals as needed for moderate pain. I reminded him to make sure that he has eaten to avoid an upset stomach. Pt voiced understanding.

## 2018-01-20 ENCOUNTER — Telehealth: Payer: Self-pay | Admitting: *Deleted

## 2018-01-20 NOTE — Telephone Encounter (Signed)
[facility]; forwarded to provider/SLS 11/22

## 2018-01-24 ENCOUNTER — Encounter: Payer: Self-pay | Admitting: Family Medicine

## 2018-01-25 ENCOUNTER — Other Ambulatory Visit: Payer: Self-pay | Admitting: Family Medicine

## 2018-01-25 ENCOUNTER — Telehealth: Payer: Self-pay | Admitting: *Deleted

## 2018-01-25 ENCOUNTER — Encounter: Payer: Self-pay | Admitting: Family Medicine

## 2018-01-25 ENCOUNTER — Telehealth: Payer: Self-pay

## 2018-01-25 DIAGNOSIS — Z1211 Encounter for screening for malignant neoplasm of colon: Secondary | ICD-10-CM

## 2018-01-25 NOTE — Telephone Encounter (Signed)
Received tramadol rx request from [facility], apparent third attempt at contacting office. Form partially completed and given to Shirlean Mylar, CMA to have Dr. Nani Ravens complete and fax to [facility] today.

## 2018-01-25 NOTE — Telephone Encounter (Signed)
Received the same [2] Physician Orders from [facility] that were forwarded to provider and faxed back to [facility] on 01/20/18; forwarded to provider, as we will send again/SLS 11/27

## 2018-01-25 NOTE — Telephone Encounter (Signed)
PCP declined signing prescription.  Stated was given previously for pain due to ingrown toenail removal (12/22/2017).   The patient currently should not need this prescription.  PCP declined refilling.

## 2018-02-03 ENCOUNTER — Telehealth: Payer: Self-pay

## 2018-02-03 ENCOUNTER — Other Ambulatory Visit: Payer: Self-pay | Admitting: Family Medicine

## 2018-02-03 DIAGNOSIS — E119 Type 2 diabetes mellitus without complications: Secondary | ICD-10-CM

## 2018-02-03 DIAGNOSIS — M79604 Pain in right leg: Secondary | ICD-10-CM

## 2018-02-03 DIAGNOSIS — Z794 Long term (current) use of insulin: Principal | ICD-10-CM

## 2018-02-03 DIAGNOSIS — R5381 Other malaise: Secondary | ICD-10-CM

## 2018-02-03 NOTE — Telephone Encounter (Signed)
Please advise 

## 2018-02-03 NOTE — Telephone Encounter (Signed)
Sister is calling to follow up about letter.  [employer] [place] duty is in January.  She is requesting a letter to be excused from jury duty permanently.

## 2018-02-03 NOTE — Telephone Encounter (Signed)
Copied from [place] [phone]. Topic: Referral - Request for Referral >> Feb 03, 2018  2:20 PM Nils Flack wrote: Has patient seen PCP for this complaint? Yes.   *If NO, is insurance requiring patient see PCP for this issue before PCP can refer them? Referral for which specialty: physical therapy  Preferred provider/office: at [facility]  Reason for referral: this was discussed at last visit

## 2018-02-06 ENCOUNTER — Encounter: Payer: Self-pay | Admitting: Family Medicine

## 2018-02-06 DIAGNOSIS — G3184 Mild cognitive impairment, so stated: Secondary | ICD-10-CM | POA: Insufficient documentation

## 2018-02-06 MED ORDER — INSULIN ASPART PROT & ASPART (70-30 MIX) 100 UNIT/ML PEN: 6.0000 [IU] | PEN_INJECTOR | Freq: Two times a day (BID) | SUBCUTANEOUS | 1 refills | Status: DC

## 2018-02-06 MED ORDER — ATORVASTATIN CALCIUM 40 MG PO TABS: 40.0000 mg | ORAL_TABLET | Freq: Every day | ORAL | 0 refills | Status: DC

## 2018-02-06 MED ORDER — NAPROXEN 375 MG PO TABS: 375.0000 mg | ORAL_TABLET | Freq: Two times a day (BID) | ORAL | 0 refills | Status: DC | PRN

## 2018-02-06 MED ORDER — METFORMIN HCL 500 MG PO TABS: 500.0000 mg | ORAL_TABLET | Freq: Every day | ORAL | 0 refills | Status: DC

## 2018-02-06 NOTE — Telephone Encounter (Signed)
Referral done

## 2018-02-06 NOTE — Telephone Encounter (Signed)
OK to place. Dx physical deconditioning. TY.

## 2018-02-13 NOTE — Telephone Encounter (Signed)
Phillip Richard left [place] duty paperwork for [employer] dob January 24, 1951. Paperwork was left in Provider's tray. Mrs Phillip Richard stated she already spoke to [place].  Please call her so she can pick up once its ready [phone]

## 2018-02-15 ENCOUNTER — Encounter: Payer: Self-pay | Admitting: *Deleted

## 2018-02-15 ENCOUNTER — Telehealth: Payer: Self-pay | Admitting: Family Medicine

## 2018-02-15 ENCOUNTER — Encounter: Payer: Self-pay | Admitting: Gastroenterology

## 2018-02-15 NOTE — Telephone Encounter (Signed)
Lorriane Shire calling back. Message from [place] relayed to [facility] agent, who said she would convey and assist in setting up OV per Dr. Nani Ravens.

## 2018-02-15 NOTE — Telephone Encounter (Signed)
Patient still has not scheduled OV appointment; per provider, he will not complete letter for jury duty without appointment as he has already been informed. Please contact patient and reiterate your provider's request. Thanks/SLS 12/18

## 2018-02-15 NOTE — Telephone Encounter (Signed)
Called the patients  Sister Louis Matte at [phone] left message to call back. Form was dropped off form/letter to write  to excuse from [employer]. The PCP request appointment to complete

## 2018-02-27 ENCOUNTER — Encounter: Payer: Self-pay | Admitting: Family Medicine

## 2018-02-27 ENCOUNTER — Ambulatory Visit (INDEPENDENT_AMBULATORY_CARE_PROVIDER_SITE_OTHER): Payer: Medicare Other | Admitting: Family Medicine

## 2018-02-27 VITALS — BP 122/78 | HR 79 | Temp 98.3°F | Ht 71.0 in | Wt 189.2 lb

## 2018-02-27 DIAGNOSIS — Z9189 Other specified personal risk factors, not elsewhere classified: Secondary | ICD-10-CM | POA: Diagnosis not present

## 2018-02-27 DIAGNOSIS — G3184 Mild cognitive impairment, so stated: Secondary | ICD-10-CM

## 2018-02-27 DIAGNOSIS — R29898 Other symptoms and signs involving the musculoskeletal system: Secondary | ICD-10-CM | POA: Diagnosis not present

## 2018-02-27 NOTE — Progress Notes (Signed)
Chief Complaint  Patient presents with  . Follow-up    paperwork    Subjective: Patient is a 67 y.o. male here for jury duty excusal. Here w sister.   Pt has a hx of mild cognitive impairment. He is not on any medication. He resides in a senior living facility. Hx of lead poisoning as an adult that seemed to affect his mentation. He does not have behavioral disturbance. He does not follow with a specialist for this. His sister does not believe he could reliably or comfortably serve on a jury.    ROS: Psych: as noted in HPI  Past Medical History:  Diagnosis Date  . Allergic rhinitis   . Diabetes mellitus without complication ([place])   . History of chicken pox   . History of lead poisoning   . Hyperlipidemia   . Mild cognitive impairment   . Mumps     Objective: BP 122/78 (BP Location: Left Arm, Patient Position: Sitting, Cuff Size: Normal)   Pulse 79   Temp 98.3 F (36.8 C) (Oral)   Ht 5\' 11"  (1.803 m)   Wt 189 lb 4 oz (85.8 kg)   SpO2 95%   BMI 26.40 kg/m  General: Awake, appears stated age HEENT: MMM, EOMi Heart: RRR, no murmurs Lungs: CTAB, no rales, wheezes or rhonchi. No accessory muscle use Psych: Age appropriate judgment and insight, normal affect and mood  Assessment and Plan: Mild cognitive impairment  History of lead poisoning  Right leg weakness - Plan: Ambulatory referral to Physical Therapy  18/30 on MOCA supports mild cog impairment.  Will see if they will excuse him.  The patient and his sister voiced understanding and agreement to the plan.  [place], DO 02/27/18  4:48 PM

## 2018-02-27 NOTE — Progress Notes (Signed)
Pre visit review using our clinic review tool, if applicable. No additional management support is needed unless otherwise documented below in the visit note. 

## 2018-03-09 DIAGNOSIS — B353 Tinea pedis: Secondary | ICD-10-CM | POA: Diagnosis not present

## 2018-03-09 DIAGNOSIS — J309 Allergic rhinitis, unspecified: Secondary | ICD-10-CM | POA: Diagnosis not present

## 2018-03-09 DIAGNOSIS — E119 Type 2 diabetes mellitus without complications: Secondary | ICD-10-CM | POA: Diagnosis not present

## 2018-03-09 DIAGNOSIS — N429 Disorder of prostate, unspecified: Secondary | ICD-10-CM | POA: Diagnosis not present

## 2018-03-09 DIAGNOSIS — Z9181 History of falling: Secondary | ICD-10-CM | POA: Diagnosis not present

## 2018-03-09 DIAGNOSIS — L853 Xerosis cutis: Secondary | ICD-10-CM | POA: Diagnosis not present

## 2018-03-09 DIAGNOSIS — Z794 Long term (current) use of insulin: Secondary | ICD-10-CM | POA: Diagnosis not present

## 2018-03-09 DIAGNOSIS — M79604 Pain in right leg: Secondary | ICD-10-CM | POA: Diagnosis not present

## 2018-03-09 DIAGNOSIS — R29898 Other symptoms and signs involving the musculoskeletal system: Secondary | ICD-10-CM | POA: Diagnosis not present

## 2018-03-09 DIAGNOSIS — G3184 Mild cognitive impairment, so stated: Secondary | ICD-10-CM | POA: Diagnosis not present

## 2018-03-09 DIAGNOSIS — E785 Hyperlipidemia, unspecified: Secondary | ICD-10-CM | POA: Diagnosis not present

## 2018-03-10 ENCOUNTER — Telehealth: Payer: Self-pay | Admitting: Family Medicine

## 2018-03-10 NOTE — Telephone Encounter (Signed)
Called [facility] left detailed message of PCP instructions. ° °

## 2018-03-10 NOTE — Telephone Encounter (Signed)
Copied from [place] [phone]. Topic: General - Other >> Mar 10, 2018  8:34 AM Yvette Rack wrote: Reason for CRM: PT Deidre from [facility] [phone] calling for verbal orders PT 1 week 1 2 week 4 and that they had a medicine alert on the Tramadol and the Nortriptyline

## 2018-03-10 NOTE — Telephone Encounter (Signed)
That's fine. He's not on either. TY.

## 2018-03-13 DIAGNOSIS — G3184 Mild cognitive impairment, so stated: Secondary | ICD-10-CM | POA: Diagnosis not present

## 2018-03-13 DIAGNOSIS — M79604 Pain in right leg: Secondary | ICD-10-CM | POA: Diagnosis not present

## 2018-03-13 DIAGNOSIS — E119 Type 2 diabetes mellitus without complications: Secondary | ICD-10-CM | POA: Diagnosis not present

## 2018-03-13 DIAGNOSIS — R29898 Other symptoms and signs involving the musculoskeletal system: Secondary | ICD-10-CM | POA: Diagnosis not present

## 2018-03-13 DIAGNOSIS — E785 Hyperlipidemia, unspecified: Secondary | ICD-10-CM | POA: Diagnosis not present

## 2018-03-13 DIAGNOSIS — N429 Disorder of prostate, unspecified: Secondary | ICD-10-CM | POA: Diagnosis not present

## 2018-03-15 DIAGNOSIS — N429 Disorder of prostate, unspecified: Secondary | ICD-10-CM | POA: Diagnosis not present

## 2018-03-15 DIAGNOSIS — E119 Type 2 diabetes mellitus without complications: Secondary | ICD-10-CM | POA: Diagnosis not present

## 2018-03-15 DIAGNOSIS — E785 Hyperlipidemia, unspecified: Secondary | ICD-10-CM | POA: Diagnosis not present

## 2018-03-15 DIAGNOSIS — M79604 Pain in right leg: Secondary | ICD-10-CM | POA: Diagnosis not present

## 2018-03-15 DIAGNOSIS — G3184 Mild cognitive impairment, so stated: Secondary | ICD-10-CM | POA: Diagnosis not present

## 2018-03-15 DIAGNOSIS — R29898 Other symptoms and signs involving the musculoskeletal system: Secondary | ICD-10-CM | POA: Diagnosis not present

## 2018-03-16 ENCOUNTER — Ambulatory Visit (AMBULATORY_SURGERY_CENTER): Payer: Self-pay | Admitting: *Deleted

## 2018-03-16 ENCOUNTER — Other Ambulatory Visit: Payer: Self-pay

## 2018-03-16 VITALS — Ht 69.0 in | Wt 186.4 lb

## 2018-03-16 DIAGNOSIS — Z1211 Encounter for screening for malignant neoplasm of colon: Secondary | ICD-10-CM

## 2018-03-16 MED ORDER — PEG 3350-KCL-NABCB-NACL-NASULF 236 G PO SOLR: 4000.0000 mL | Freq: Once | ORAL | 0 refills | Status: AC

## 2018-03-16 NOTE — Progress Notes (Signed)
Patient denies any allergies to egg or soy products. Patient denies complications with anesthesia/sedation.  Patient denies oxygen use at home and denies diet medications.  Pamphlet given on colonoscopy.  Patient is not a research patient.

## 2018-03-20 ENCOUNTER — Telehealth: Payer: Self-pay | Admitting: *Deleted

## 2018-03-20 NOTE — Telephone Encounter (Signed)
We have received numerous faxes for this patient regarding his Tramadol, which we have explicitly made clear with large notes on return faxes to "discontinue/denied the medication, it was only short-term prescription, no further refills will be given." Received a new fax today after faxing back on Friday once again to "D/C Tramadol" with provider's signature. Phillip Richard called and spoke with Allegra to clarify orally that we wish to D/C medication and why are we receiving continuing faxes regarding this matter; she just stated that "that's what she thought we wanted and had written, but wasn't sure." There should be no further faxes on this matter/SLS 01/20

## 2018-03-21 ENCOUNTER — Encounter: Payer: Medicare Other | Admitting: Gastroenterology

## 2018-03-21 ENCOUNTER — Telehealth: Payer: Self-pay | Admitting: *Deleted

## 2018-03-21 ENCOUNTER — Telehealth: Payer: Self-pay | Admitting: Family Medicine

## 2018-03-21 DIAGNOSIS — R29898 Other symptoms and signs involving the musculoskeletal system: Secondary | ICD-10-CM | POA: Diagnosis not present

## 2018-03-21 DIAGNOSIS — G3184 Mild cognitive impairment, so stated: Secondary | ICD-10-CM | POA: Diagnosis not present

## 2018-03-21 DIAGNOSIS — M79604 Pain in right leg: Secondary | ICD-10-CM | POA: Diagnosis not present

## 2018-03-21 DIAGNOSIS — E785 Hyperlipidemia, unspecified: Secondary | ICD-10-CM | POA: Diagnosis not present

## 2018-03-21 DIAGNOSIS — N429 Disorder of prostate, unspecified: Secondary | ICD-10-CM | POA: Diagnosis not present

## 2018-03-21 DIAGNOSIS — E119 Type 2 diabetes mellitus without complications: Secondary | ICD-10-CM | POA: Diagnosis not present

## 2018-03-21 NOTE — Telephone Encounter (Signed)
Received Home Health Certification and Plan of Care; forwarded to provider/SLS 001/21

## 2018-03-21 NOTE — Telephone Encounter (Signed)
FYI

## 2018-03-21 NOTE — Telephone Encounter (Signed)
Copied from [place] [id number]. Topic: Quick Communication - See Telephone Encounter >> Mar 21, 2018  4:27 PM Rutherford Nail, NT wrote: CRM for notification. See Telephone encounter for: 03/21/18. Sean, physical therapist, with [facility] calling and states that they have a protocol that they have to report and falls. States that the patient had a fall this morning at breakfast that the facility reported to him. States patient went to sit in a chair and it moved and the patient slid to the floor. States that he was not injured and denies having any pain. CB#: [phone]

## 2018-03-22 ENCOUNTER — Telehealth: Payer: Self-pay | Admitting: *Deleted

## 2018-03-22 NOTE — Telephone Encounter (Signed)
Received Physician Orders from [facility] for [place] [facility]; forwarded to provider/SLS 01/22

## 2018-03-23 DIAGNOSIS — E119 Type 2 diabetes mellitus without complications: Secondary | ICD-10-CM | POA: Diagnosis not present

## 2018-03-23 DIAGNOSIS — R29898 Other symptoms and signs involving the musculoskeletal system: Secondary | ICD-10-CM | POA: Diagnosis not present

## 2018-03-23 DIAGNOSIS — N429 Disorder of prostate, unspecified: Secondary | ICD-10-CM | POA: Diagnosis not present

## 2018-03-23 DIAGNOSIS — M79604 Pain in right leg: Secondary | ICD-10-CM | POA: Diagnosis not present

## 2018-03-23 DIAGNOSIS — E785 Hyperlipidemia, unspecified: Secondary | ICD-10-CM | POA: Diagnosis not present

## 2018-03-23 DIAGNOSIS — G3184 Mild cognitive impairment, so stated: Secondary | ICD-10-CM | POA: Diagnosis not present

## 2018-03-28 DIAGNOSIS — E119 Type 2 diabetes mellitus without complications: Secondary | ICD-10-CM | POA: Diagnosis not present

## 2018-03-28 DIAGNOSIS — E785 Hyperlipidemia, unspecified: Secondary | ICD-10-CM | POA: Diagnosis not present

## 2018-03-28 DIAGNOSIS — M79604 Pain in right leg: Secondary | ICD-10-CM | POA: Diagnosis not present

## 2018-03-28 DIAGNOSIS — G3184 Mild cognitive impairment, so stated: Secondary | ICD-10-CM | POA: Diagnosis not present

## 2018-03-28 DIAGNOSIS — R29898 Other symptoms and signs involving the musculoskeletal system: Secondary | ICD-10-CM | POA: Diagnosis not present

## 2018-03-28 DIAGNOSIS — N429 Disorder of prostate, unspecified: Secondary | ICD-10-CM | POA: Diagnosis not present

## 2018-03-28 NOTE — Telephone Encounter (Signed)
Received Physician Orders from [facility] regarding fall and request for any reply and/or orders; forwarded to provider/SLS 01/28

## 2018-03-30 ENCOUNTER — Ambulatory Visit (AMBULATORY_SURGERY_CENTER): Payer: Medicare Other | Admitting: Gastroenterology

## 2018-03-30 ENCOUNTER — Encounter: Payer: Self-pay | Admitting: Gastroenterology

## 2018-03-30 VITALS — BP 152/98 | HR 60 | Temp 99.6°F | Resp 16 | Ht 69.0 in | Wt 186.0 lb

## 2018-03-30 DIAGNOSIS — Z1211 Encounter for screening for malignant neoplasm of colon: Secondary | ICD-10-CM | POA: Diagnosis not present

## 2018-03-30 MED ORDER — SODIUM CHLORIDE 0.9 % IV SOLN: 500.0000 mL | Freq: Once | INTRAVENOUS | Status: DC

## 2018-03-30 NOTE — Progress Notes (Signed)
Patient states he ate breakfast yesterday and chicken strips with macaroni and cheese at 1200 for lunch yesterday. He states he did complete entire Golytely prep and that his stool is cloudy brown without any pieces of stool. Dr. Rush Landmark notified. I spoke with patient at lenghth and explained the importance of a complete prep so that the Dr. Is able to visualize the colon properly and that if the prep was suboptimal they may have to stop the procedure and reschedule and he would be charged twice. Patient verbalizes understanding and states he would like to proceed. Dr. Rush Landmark notified of this and patient taken back for procedure.

## 2018-03-30 NOTE — Patient Instructions (Signed)
Handouts Provided:  High Fiber Diet  YOU HAD AN ENDOSCOPIC PROCEDURE TODAY AT [facility]:   Refer to the procedure report that was given to you for any specific questions about what was found during the examination.  If the procedure report does not answer your questions, please call your gastroenterologist to clarify.  If you requested that your care partner not be given the details of your procedure findings, then the procedure report has been included in a sealed envelope for you to review at your convenience later.  YOU SHOULD EXPECT: Some feelings of bloating in the abdomen. Passage of more gas than usual.  Walking can help get rid of the air that was put into your GI tract during the procedure and reduce the bloating. If you had a lower endoscopy (such as a colonoscopy or flexible sigmoidoscopy) you may notice spotting of blood in your stool or on the toilet paper. If you underwent a bowel prep for your procedure, you may not have a normal bowel movement for a few days.  Please Note:  You might notice some irritation and congestion in your nose or some drainage.  This is from the oxygen used during your procedure.  There is no need for concern and it should clear up in a day or so.  SYMPTOMS TO REPORT IMMEDIATELY:   Following lower endoscopy (colonoscopy or flexible sigmoidoscopy):  Excessive amounts of blood in the stool  Significant tenderness or worsening of abdominal pains  Swelling of the abdomen that is new, acute  Fever of 100F or higher  For urgent or emergent issues, a gastroenterologist can be reached at any hour by calling [phone].   DIET:  We do recommend a small meal at first, but then you may proceed to your regular diet.  Drink plenty of fluids but you should avoid alcoholic beverages for 24 hours.  ACTIVITY:  You should plan to take it easy for the rest of today and you should NOT DRIVE or use heavy machinery until tomorrow (because of the  sedation medicines used during the test).    FOLLOW UP: Our staff will call the number listed on your records the next business day following your procedure to check on you and address any questions or concerns that you may have regarding the information given to you following your procedure. If we do not reach you, we will leave a message.  However, if you are feeling well and you are not experiencing any problems, there is no need to return our call.  We will assume that you have returned to your regular daily activities without incident.  If any biopsies were taken you will be contacted by phone or by letter within the next 1-3 weeks.  Please call us at [phone] if you have not heard about the biopsies in 3 weeks.    SIGNATURES/CONFIDENTIALITY: You and/or your care partner have signed paperwork which will be entered into your electronic medical record.  These signatures attest to the fact that that the information above on your After Visit Summary has been reviewed and is understood.  Full responsibility of the confidentiality of this discharge information lies with you and/or your care-partner.

## 2018-03-30 NOTE — Progress Notes (Signed)
PT taken to PACU. Monitors in place. VSS. Report given to RN. 

## 2018-03-30 NOTE — Op Note (Signed)
[facility] Patient Name: Phillip Richard Procedure Date: 03/30/2018 8:03 AM MRN: [id number] Endoscopist: Justice Britain , MD Age: 68 Referring MD:  Date of Birth: October 18, 1950 Gender: Male Account #: [id number] Procedure:                Colonoscopy Indications:              Screening for colorectal malignant neoplasm Medicines:                Monitored Anesthesia Care Procedure:                Pre-Anesthesia Assessment:                           - Prior to the procedure, a History and Physical                            was performed, and patient medications and                            allergies were reviewed. The patient's tolerance of                            previous anesthesia was also reviewed. The risks                            and benefits of the procedure and the sedation                            options and risks were discussed with the patient.                            All questions were answered, and informed consent                            was obtained. Prior Anticoagulants: The patient has                            taken no previous anticoagulant or antiplatelet                            agents. ASA Grade Assessment: II - A patient with                            mild systemic disease. After reviewing the risks                            and benefits, the patient was deemed in                            satisfactory condition to undergo the procedure.                           After obtaining informed consent, the colonoscope  was passed under direct vision. Throughout the                            procedure, the patient's blood pressure, pulse, and                            oxygen saturations were monitored continuously. The                            Colonoscope was introduced through the anus and                            advanced to the the cecum, identified by                            appendiceal orifice and  ileocecal valve. The                            quality of the bowel preparation was adequate. The                            ileocecal valve, appendiceal orifice, and rectum                            were photographed. Scope In: 8:09:41 AM Scope Out: 8:29:53 AM Scope Withdrawal Time: 0 hours 16 minutes 49 seconds  Total Procedure Duration: 0 hours 20 minutes 12 seconds  Findings:                 The digital rectal exam findings include                            non-thrombosed external hemorrhoids. Pertinent                            negatives include no palpable rectal lesions.                           A large amount of semi-liquid stool was found in                            the entire colon, interfering with visualization.                            Lavage of the area was performed using copious                            amounts, resulting in clearance with adequate                            visualization.                           Normal mucosa was found in the entire colon.  Multiple small-mouthed diverticula were found in                            the recto-sigmoid colon, sigmoid colon and                            descending colon.                           Non-bleeding non-thrombosed external and internal                            hemorrhoids were found during retroflexion, during                            perianal exam and during digital exam. The                            hemorrhoids were Grade I (internal hemorrhoids that                            do not prolapse). Complications:            No immediate complications. Estimated Blood Loss:     Estimated blood loss: none. Impression:               - Non-thrombosed external hemorrhoids found on                            digital rectal exam.                           - Stool in the entire examined colon.                           - Normal mucosa in the entire examined colon.                            - Diverticulosis in the recto-sigmoid colon, in the                            sigmoid colon and in the descending colon.                           - Non-bleeding non-thrombosed external and internal                            hemorrhoids. Recommendation:           - The patient will be observed post-procedure,                            until all discharge criteria are met.                           - Discharge patient to home.                           -  Patient has a contact number available for                            emergencies. The signs and symptoms of potential                            delayed complications were discussed with the                            patient. Return to normal activities tomorrow.                            Written discharge instructions were provided to the                            patient.                           - High fiber diet.                           - Use fiber, for example Citrucel, Fibercon, Konsyl                            or Metamucil.                           - Due to the time required for extensive lavage and                            cleaning of the colon during this colonoscopy,                            though views obtained at the end of the procedure                            were adequate, I recommend a repeat colonoscopy in                            5 years for screening purposes and complete                            adherence to the bowel preparation (patient ate                            full meals the day prior to this procedure).                           - The findings and recommendations were discussed                            with the patient.                           - The findings and recommendations  were discussed                            with the patient's family. Justice Britain, MD 03/30/2018 8:35:45 AM

## 2018-03-30 NOTE — Progress Notes (Signed)
Pt's states no medical or surgical changes since previsit or office visit. 

## 2018-03-31 ENCOUNTER — Telehealth: Payer: Self-pay | Admitting: *Deleted

## 2018-03-31 DIAGNOSIS — E785 Hyperlipidemia, unspecified: Secondary | ICD-10-CM | POA: Diagnosis not present

## 2018-03-31 DIAGNOSIS — G3184 Mild cognitive impairment, so stated: Secondary | ICD-10-CM | POA: Diagnosis not present

## 2018-03-31 DIAGNOSIS — N429 Disorder of prostate, unspecified: Secondary | ICD-10-CM | POA: Diagnosis not present

## 2018-03-31 DIAGNOSIS — R29898 Other symptoms and signs involving the musculoskeletal system: Secondary | ICD-10-CM | POA: Diagnosis not present

## 2018-03-31 DIAGNOSIS — M79604 Pain in right leg: Secondary | ICD-10-CM | POA: Diagnosis not present

## 2018-03-31 DIAGNOSIS — E119 Type 2 diabetes mellitus without complications: Secondary | ICD-10-CM | POA: Diagnosis not present

## 2018-03-31 NOTE — Telephone Encounter (Signed)
  Follow up Call-  Call back number 03/30/2018  Post procedure Call Back phone  # [phone]  Permission to leave phone message Yes  Some recent data might be hidden    No answer, no machine picked up

## 2018-04-04 DIAGNOSIS — E119 Type 2 diabetes mellitus without complications: Secondary | ICD-10-CM | POA: Diagnosis not present

## 2018-04-04 DIAGNOSIS — E785 Hyperlipidemia, unspecified: Secondary | ICD-10-CM | POA: Diagnosis not present

## 2018-04-04 DIAGNOSIS — R29898 Other symptoms and signs involving the musculoskeletal system: Secondary | ICD-10-CM | POA: Diagnosis not present

## 2018-04-04 DIAGNOSIS — M79604 Pain in right leg: Secondary | ICD-10-CM | POA: Diagnosis not present

## 2018-04-04 DIAGNOSIS — G3184 Mild cognitive impairment, so stated: Secondary | ICD-10-CM | POA: Diagnosis not present

## 2018-04-04 DIAGNOSIS — N429 Disorder of prostate, unspecified: Secondary | ICD-10-CM | POA: Diagnosis not present

## 2018-04-05 ENCOUNTER — Other Ambulatory Visit: Payer: Self-pay | Admitting: Family Medicine

## 2018-04-05 ENCOUNTER — Telehealth: Payer: Self-pay | Admitting: *Deleted

## 2018-04-05 NOTE — Telephone Encounter (Signed)
Received Home Health Certification and Plan of Care; forwarded to provider/SLS 02/05

## 2018-04-07 ENCOUNTER — Telehealth: Payer: Self-pay | Admitting: Family Medicine

## 2018-04-07 DIAGNOSIS — M79604 Pain in right leg: Secondary | ICD-10-CM | POA: Diagnosis not present

## 2018-04-07 DIAGNOSIS — R29898 Other symptoms and signs involving the musculoskeletal system: Secondary | ICD-10-CM | POA: Diagnosis not present

## 2018-04-07 DIAGNOSIS — E119 Type 2 diabetes mellitus without complications: Secondary | ICD-10-CM | POA: Diagnosis not present

## 2018-04-07 DIAGNOSIS — G3184 Mild cognitive impairment, so stated: Secondary | ICD-10-CM | POA: Diagnosis not present

## 2018-04-07 DIAGNOSIS — N429 Disorder of prostate, unspecified: Secondary | ICD-10-CM | POA: Diagnosis not present

## 2018-04-07 DIAGNOSIS — E785 Hyperlipidemia, unspecified: Secondary | ICD-10-CM | POA: Diagnosis not present

## 2018-04-07 NOTE — Telephone Encounter (Signed)
Copied from [place] [phone]. Topic: Quick Communication - Home Health Verbal Orders >> Apr 07, 2018  3:30 PM Vernona Rieger wrote: Caller/Agency: [facility] Number: [phone] Requesting OT/PT/Skilled Nursing/Social Work: Physical Therapy Frequency: twice a week for three more weeks

## 2018-04-07 NOTE — Telephone Encounter (Signed)
OK 

## 2018-04-07 NOTE — Telephone Encounter (Signed)
Left detailed message on Voicemail at below # that it is ok to proceed with PT as requested. Please advise if any other recommendation.

## 2018-04-08 DIAGNOSIS — J309 Allergic rhinitis, unspecified: Secondary | ICD-10-CM | POA: Diagnosis not present

## 2018-04-08 DIAGNOSIS — E119 Type 2 diabetes mellitus without complications: Secondary | ICD-10-CM | POA: Diagnosis not present

## 2018-04-08 DIAGNOSIS — N429 Disorder of prostate, unspecified: Secondary | ICD-10-CM | POA: Diagnosis not present

## 2018-04-08 DIAGNOSIS — Z794 Long term (current) use of insulin: Secondary | ICD-10-CM | POA: Diagnosis not present

## 2018-04-08 DIAGNOSIS — E785 Hyperlipidemia, unspecified: Secondary | ICD-10-CM | POA: Diagnosis not present

## 2018-04-08 DIAGNOSIS — M79604 Pain in right leg: Secondary | ICD-10-CM | POA: Diagnosis not present

## 2018-04-08 DIAGNOSIS — L853 Xerosis cutis: Secondary | ICD-10-CM | POA: Diagnosis not present

## 2018-04-08 DIAGNOSIS — Z9181 History of falling: Secondary | ICD-10-CM | POA: Diagnosis not present

## 2018-04-08 DIAGNOSIS — G3184 Mild cognitive impairment, so stated: Secondary | ICD-10-CM | POA: Diagnosis not present

## 2018-04-08 DIAGNOSIS — B353 Tinea pedis: Secondary | ICD-10-CM | POA: Diagnosis not present

## 2018-04-08 DIAGNOSIS — R29898 Other symptoms and signs involving the musculoskeletal system: Secondary | ICD-10-CM | POA: Diagnosis not present

## 2018-04-11 DIAGNOSIS — N429 Disorder of prostate, unspecified: Secondary | ICD-10-CM | POA: Diagnosis not present

## 2018-04-11 DIAGNOSIS — R29898 Other symptoms and signs involving the musculoskeletal system: Secondary | ICD-10-CM | POA: Diagnosis not present

## 2018-04-11 DIAGNOSIS — G3184 Mild cognitive impairment, so stated: Secondary | ICD-10-CM | POA: Diagnosis not present

## 2018-04-11 DIAGNOSIS — E119 Type 2 diabetes mellitus without complications: Secondary | ICD-10-CM | POA: Diagnosis not present

## 2018-04-11 DIAGNOSIS — E785 Hyperlipidemia, unspecified: Secondary | ICD-10-CM | POA: Diagnosis not present

## 2018-04-11 DIAGNOSIS — M79604 Pain in right leg: Secondary | ICD-10-CM | POA: Diagnosis not present

## 2018-04-12 ENCOUNTER — Telehealth: Payer: Self-pay | Admitting: *Deleted

## 2018-04-12 NOTE — Telephone Encounter (Signed)
Received Physician Orders from [facility]; forwarded to provider/SLS 02/12

## 2018-04-13 DIAGNOSIS — E785 Hyperlipidemia, unspecified: Secondary | ICD-10-CM | POA: Diagnosis not present

## 2018-04-13 DIAGNOSIS — R29898 Other symptoms and signs involving the musculoskeletal system: Secondary | ICD-10-CM | POA: Diagnosis not present

## 2018-04-13 DIAGNOSIS — M79604 Pain in right leg: Secondary | ICD-10-CM | POA: Diagnosis not present

## 2018-04-13 DIAGNOSIS — G3184 Mild cognitive impairment, so stated: Secondary | ICD-10-CM | POA: Diagnosis not present

## 2018-04-13 DIAGNOSIS — E119 Type 2 diabetes mellitus without complications: Secondary | ICD-10-CM | POA: Diagnosis not present

## 2018-04-13 DIAGNOSIS — N429 Disorder of prostate, unspecified: Secondary | ICD-10-CM | POA: Diagnosis not present

## 2018-04-17 DIAGNOSIS — M79604 Pain in right leg: Secondary | ICD-10-CM | POA: Diagnosis not present

## 2018-04-17 DIAGNOSIS — E785 Hyperlipidemia, unspecified: Secondary | ICD-10-CM | POA: Diagnosis not present

## 2018-04-17 DIAGNOSIS — R29898 Other symptoms and signs involving the musculoskeletal system: Secondary | ICD-10-CM | POA: Diagnosis not present

## 2018-04-17 DIAGNOSIS — N429 Disorder of prostate, unspecified: Secondary | ICD-10-CM | POA: Diagnosis not present

## 2018-04-17 DIAGNOSIS — G3184 Mild cognitive impairment, so stated: Secondary | ICD-10-CM | POA: Diagnosis not present

## 2018-04-17 DIAGNOSIS — E119 Type 2 diabetes mellitus without complications: Secondary | ICD-10-CM | POA: Diagnosis not present

## 2018-04-19 ENCOUNTER — Telehealth: Payer: Self-pay | Admitting: *Deleted

## 2018-04-19 NOTE — Telephone Encounter (Signed)
Received Physician Orders from [facility]; forwarded to provider/SLS 02/19

## 2018-04-20 DIAGNOSIS — N429 Disorder of prostate, unspecified: Secondary | ICD-10-CM | POA: Diagnosis not present

## 2018-04-20 DIAGNOSIS — M79604 Pain in right leg: Secondary | ICD-10-CM | POA: Diagnosis not present

## 2018-04-20 DIAGNOSIS — G3184 Mild cognitive impairment, so stated: Secondary | ICD-10-CM | POA: Diagnosis not present

## 2018-04-20 DIAGNOSIS — E119 Type 2 diabetes mellitus without complications: Secondary | ICD-10-CM | POA: Diagnosis not present

## 2018-04-20 DIAGNOSIS — E785 Hyperlipidemia, unspecified: Secondary | ICD-10-CM | POA: Diagnosis not present

## 2018-04-20 DIAGNOSIS — R29898 Other symptoms and signs involving the musculoskeletal system: Secondary | ICD-10-CM | POA: Diagnosis not present

## 2018-04-25 DIAGNOSIS — R29898 Other symptoms and signs involving the musculoskeletal system: Secondary | ICD-10-CM | POA: Diagnosis not present

## 2018-04-25 DIAGNOSIS — G3184 Mild cognitive impairment, so stated: Secondary | ICD-10-CM | POA: Diagnosis not present

## 2018-04-25 DIAGNOSIS — E119 Type 2 diabetes mellitus without complications: Secondary | ICD-10-CM | POA: Diagnosis not present

## 2018-04-25 DIAGNOSIS — M79604 Pain in right leg: Secondary | ICD-10-CM | POA: Diagnosis not present

## 2018-04-25 DIAGNOSIS — E785 Hyperlipidemia, unspecified: Secondary | ICD-10-CM | POA: Diagnosis not present

## 2018-04-25 DIAGNOSIS — N429 Disorder of prostate, unspecified: Secondary | ICD-10-CM | POA: Diagnosis not present

## 2018-04-26 DIAGNOSIS — S6992XA Unspecified injury of left wrist, hand and finger(s), initial encounter: Secondary | ICD-10-CM | POA: Diagnosis not present

## 2018-04-26 DIAGNOSIS — M11232 Other chondrocalcinosis, left wrist: Secondary | ICD-10-CM | POA: Diagnosis not present

## 2018-04-26 DIAGNOSIS — Y999 Unspecified external cause status: Secondary | ICD-10-CM | POA: Diagnosis not present

## 2018-04-26 DIAGNOSIS — X509XXA Other and unspecified overexertion or strenuous movements or postures, initial encounter: Secondary | ICD-10-CM | POA: Diagnosis not present

## 2018-04-27 ENCOUNTER — Telehealth: Payer: Self-pay | Admitting: *Deleted

## 2018-04-27 DIAGNOSIS — R29898 Other symptoms and signs involving the musculoskeletal system: Secondary | ICD-10-CM | POA: Diagnosis not present

## 2018-04-27 DIAGNOSIS — M79604 Pain in right leg: Secondary | ICD-10-CM | POA: Diagnosis not present

## 2018-04-27 DIAGNOSIS — N429 Disorder of prostate, unspecified: Secondary | ICD-10-CM | POA: Diagnosis not present

## 2018-04-27 DIAGNOSIS — E119 Type 2 diabetes mellitus without complications: Secondary | ICD-10-CM | POA: Diagnosis not present

## 2018-04-27 DIAGNOSIS — G3184 Mild cognitive impairment, so stated: Secondary | ICD-10-CM | POA: Diagnosis not present

## 2018-04-27 DIAGNOSIS — E785 Hyperlipidemia, unspecified: Secondary | ICD-10-CM | POA: Diagnosis not present

## 2018-04-27 NOTE — Telephone Encounter (Signed)
Received Physician Orders from [facility] for Xray of left wrist; forwarded to provider/SLS 02/27

## 2018-04-28 ENCOUNTER — Telehealth: Payer: Self-pay | Admitting: *Deleted

## 2018-04-28 NOTE — Telephone Encounter (Signed)
Received Physician Orders from [facility]; forwarded to provider/SLS 02/27

## 2018-05-02 ENCOUNTER — Telehealth: Payer: Self-pay | Admitting: *Deleted

## 2018-05-02 NOTE — Telephone Encounter (Signed)
Received Physician Orders from [facility]; forwarded to provider/SLS 03/03

## 2018-05-03 ENCOUNTER — Telehealth: Payer: Self-pay | Admitting: *Deleted

## 2018-05-03 NOTE — Telephone Encounter (Signed)
Receive Episode Summary Report from [facility]; forwarded to provider/SLS 03/04

## 2018-05-16 ENCOUNTER — Telehealth: Payer: Self-pay | Admitting: *Deleted

## 2018-05-16 NOTE — Telephone Encounter (Signed)
Received Physician Orders from [facility]; forwarded to provider/SLS 03/17

## 2018-05-22 ENCOUNTER — Telehealth: Payer: Self-pay | Admitting: *Deleted

## 2018-05-22 NOTE — Telephone Encounter (Signed)
Received Physician Orders from [facility]; forwarded to provider/SLS 03/23

## 2018-06-14 ENCOUNTER — Encounter: Payer: Self-pay | Admitting: *Deleted

## 2018-06-14 ENCOUNTER — Telehealth: Payer: Self-pay | Admitting: *Deleted

## 2018-06-14 NOTE — Telephone Encounter (Signed)
Received Physician Orders for DMA-[id number]/PCS from [facility]; forwarded to provider/SLS 04/15

## 2018-06-21 ENCOUNTER — Telehealth: Payer: Self-pay | Admitting: *Deleted

## 2018-06-21 NOTE — Telephone Encounter (Signed)
Received Physician Orders/POC from [facility]; forwarded to provider/SLS 04/22

## 2018-07-31 ENCOUNTER — Other Ambulatory Visit: Payer: Self-pay | Admitting: Family Medicine

## 2018-07-31 DIAGNOSIS — M79604 Pain in right leg: Secondary | ICD-10-CM

## 2018-07-31 DIAGNOSIS — E119 Type 2 diabetes mellitus without complications: Secondary | ICD-10-CM

## 2018-08-11 ENCOUNTER — Telehealth: Payer: Self-pay | Admitting: Family Medicine

## 2018-08-11 NOTE — Telephone Encounter (Signed)
Copied from [place] [id number]. Topic: General - Inquiry >> Aug 11, 2018 10:22 AM Virl Axe D wrote: Reason for CRM: [facility] stated they could not completely read the writing on an order received from Dr. Nani Ravens regarding Covid-19 testing for pt.They are faxing it back and if it is okay for testing, they are asking if someone could write YES on the fax and send it back. Please advise.   refaxed form with request

## 2018-08-24 DIAGNOSIS — Z20828 Contact with and (suspected) exposure to other viral communicable diseases: Secondary | ICD-10-CM | POA: Diagnosis not present

## 2018-09-27 ENCOUNTER — Telehealth: Payer: Self-pay | Admitting: Family Medicine

## 2018-09-27 NOTE — Telephone Encounter (Signed)
Patient's sister, Lorriane Shire (not with patient at time of call), reports that patients blood sugar has been in the 300's for the past week. Lorriane Shire inquired if Dr. Nani Ravens or CMA could contact [facility] and advise patient on what to do. Lorriane Shire is also requesting a call back, if possible, to discuss.

## 2018-09-27 NOTE — Telephone Encounter (Signed)
Please advise 

## 2018-09-27 NOTE — Telephone Encounter (Signed)
Called the sister Lorriane Shire and she would like to schedule a Virtual (but needs a telephone)....scheduled for this Friday 09/29/2018 at 7:45

## 2018-09-27 NOTE — Telephone Encounter (Signed)
Sched appt please. E visit or in person at his preference. Ty.

## 2018-09-29 ENCOUNTER — Ambulatory Visit (INDEPENDENT_AMBULATORY_CARE_PROVIDER_SITE_OTHER): Payer: Medicare Other | Admitting: Family Medicine

## 2018-09-29 ENCOUNTER — Encounter: Payer: Self-pay | Admitting: Family Medicine

## 2018-09-29 ENCOUNTER — Other Ambulatory Visit: Payer: Self-pay

## 2018-09-29 DIAGNOSIS — E119 Type 2 diabetes mellitus without complications: Secondary | ICD-10-CM

## 2018-09-29 DIAGNOSIS — Z794 Long term (current) use of insulin: Secondary | ICD-10-CM | POA: Diagnosis not present

## 2018-09-29 NOTE — Progress Notes (Signed)
Subjective:   Chief Complaint  Patient presents with  . Blood Sugar Problem    Phillip Richard is a 68 y.o. male here for follow-up of diabetes.  Due to COVID-19 pandemic, we are interacting via telephone. I verified patient's ID using 2 identifiers. Patient agreed to proceed with visit via this method. Patient is at home, I am at office. Patient, sister and I are present for visit.   [name]'s self monitored glucose range is mid 200's-300's.  Patient denies hypoglycemic reactions. He  Patient is on 70/30 insulin 6 u bid.  Medications include: metformin 500 mg/d Exercise: very little activity 2/2 pandemic, has been in room  Past Medical History:  Diagnosis Date  . Allergic rhinitis   . Allergy   . Arthritis    shoulder  . Cataract    both eyes - just watching now  . Diabetes mellitus without complication ([place])   . History of chicken pox   . History of lead poisoning   . Hyperlipidemia   . Mild cognitive impairment   . Mumps   . Uses walker    ambulates with walker     Related testing: Date of retinal exam: >1 yr ago, got pushed back 2/2 Covid 19 Pneumovax: done Flu Shot: done  Review of Systems: Pulmonary:  No SOB Cardiovascular:  No chest pain  Objective:  No conversational dyspnea Age appropriate judgment and insight  Assessment:   Type 2 diabetes mellitus without complication, with long-term current use of insulin ([place])  Plan:   Pt has f/u Mon. Will change ot 1600-1800 kcal/d diet. 100-110 carb limit. Cont 70/30 for now, hoping his pancreas is not burning out.  Total time spent: 13 min The patient and his sister voiced understanding and agreement to the plan.  [name], DO 09/29/18 8:00 AM

## 2018-10-02 ENCOUNTER — Other Ambulatory Visit: Payer: Self-pay

## 2018-10-02 ENCOUNTER — Encounter: Payer: Self-pay | Admitting: Family Medicine

## 2018-10-02 ENCOUNTER — Ambulatory Visit (INDEPENDENT_AMBULATORY_CARE_PROVIDER_SITE_OTHER): Payer: Medicare Other | Admitting: Family Medicine

## 2018-10-02 VITALS — BP 120/68 | HR 76 | Temp 97.5°F | Ht 71.0 in | Wt 201.1 lb

## 2018-10-02 DIAGNOSIS — E119 Type 2 diabetes mellitus without complications: Secondary | ICD-10-CM

## 2018-10-02 DIAGNOSIS — B353 Tinea pedis: Secondary | ICD-10-CM

## 2018-10-02 DIAGNOSIS — Z794 Long term (current) use of insulin: Secondary | ICD-10-CM | POA: Diagnosis not present

## 2018-10-02 LAB — COMPREHENSIVE METABOLIC PANEL
ALT: 55 U/L — ABNORMAL HIGH (ref 0–53)
AST: 33 U/L (ref 0–37)
Albumin: 4.1 g/dL (ref 3.5–5.2)
Alkaline Phosphatase: 87 U/L (ref 39–117)
BUN: 18 mg/dL (ref 6–23)
CO2: 25 mEq/L (ref 19–32)
Calcium: 9.7 mg/dL (ref 8.4–10.5)
Chloride: 105 mEq/L (ref 96–112)
Creatinine, Ser: 1.16 mg/dL (ref 0.40–1.50)
GFR: 62.21 mL/min (ref >60.00)
Glucose, Bld: 259 mg/dL — ABNORMAL HIGH (ref 70–99)
Potassium: 4.7 mEq/L (ref 3.5–5.1)
Sodium: 137 mEq/L (ref 135–145)
Total Bilirubin: 0.5 mg/dL (ref 0.2–1.2)
Total Protein: 7.1 g/dL (ref 6.0–8.3)

## 2018-10-02 LAB — LIPID PANEL
Cholesterol: 68 mg/dL (ref 0–200)
HDL: 28.5 mg/dL — ABNORMAL LOW (ref >39.00)
LDL Cholesterol: 25 mg/dL (ref 0–99)
NonHDL: 39.35
Total CHOL/HDL Ratio: 2
Triglycerides: 71 mg/dL (ref 0.0–149.0)
VLDL: 14.2 mg/dL (ref 0.0–40.0)

## 2018-10-02 LAB — CBC
HCT: 41.8 % (ref 39.0–52.0)
Hemoglobin: 13.9 g/dL (ref 13.0–17.0)
MCHC: 33.2 g/dL (ref 30.0–36.0)
MCV: 84.2 fl (ref 78.0–100.0)
Platelets: 158 10*3/uL (ref 150.0–400.0)
RBC: 4.97 Mil/uL (ref 4.22–5.81)
RDW: 13.7 % (ref 11.5–15.5)
WBC: 8.3 10*3/uL (ref 4.0–10.5)

## 2018-10-02 LAB — HEMOGLOBIN A1C: Hgb A1c MFr Bld: 11 % — ABNORMAL HIGH (ref 4.6–6.5)

## 2018-10-02 MED ORDER — METFORMIN HCL 500 MG PO TABS: 500.0000 mg | ORAL_TABLET | Freq: Two times a day (BID) | ORAL | 1 refills | Status: DC

## 2018-10-02 MED ORDER — KETOCONAZOLE 2 % EX CREA: 1.0000 "application " | TOPICAL_CREAM | Freq: Every day | CUTANEOUS | 0 refills | Status: DC

## 2018-10-02 MED ORDER — INSULIN ASP PROT & ASP FLEXPEN (70-30) 100 UNIT/ML ~~LOC~~ SUPN: 9.0000 [IU] | PEN_INJECTOR | Freq: Two times a day (BID) | SUBCUTANEOUS | 1 refills | Status: DC

## 2018-10-02 NOTE — Patient Instructions (Addendum)
New diet is 1600-1800 calories daily. 100-110 carbs daily as well.   Keep the diet clean and stay active.  Aim to do some physical exertion for 150 minutes per week. This is typically divided into 5 days per week, 30 minutes per day. The activity should be enough to get your heart rate up. Anything is better than nothing if you have time constraints.  We are increasing dose of insulin from 6 units twice daily to 9 units. We are also taking Metformin twice daily from once daily.  Give [place] 2-3 business days to get the results of your labs back.   The cream is for 6 weeks on the R foot.  Let us know if you need anything.

## 2018-10-02 NOTE — Progress Notes (Signed)
Chief Complaint  Patient presents with  . Follow-up    Subjective: Patient is a 68 y.o. male here for follow-up diabetes.  He is here with his sister.  Patient has gained several pounds and has not been eating healthily.  He will walk daily.  Sugars are averaging in the 200s.  No chest pain or shortness of breath.  He is due for his eye exam, however this has been delayed due to the pandemic.  He is compliant with his 70/30 insulin 6 units twice daily and metformin 500 mg daily.  Over the past several weeks, the patient has noticed macerated skin in between his toes on the right foot.  No pain, some itching.  He has had this before in the past.  ROS: Heart: Denies chest pain  Lungs: Denies SOB   Past Medical History:  Diagnosis Date  . Allergic rhinitis   . Allergy   . Arthritis    shoulder  . Cataract    both eyes - just watching now  . Diabetes mellitus without complication ([place])   . History of chicken pox   . History of lead poisoning   . Hyperlipidemia   . Mild cognitive impairment   . Mumps   . Uses walker    ambulates with walker    Objective: BP 120/68 (BP Location: Left Arm, Patient Position: Sitting, Cuff Size: Normal)   Pulse 76   Temp (!) 97.5 F (36.4 C) (Oral)   Ht 5\' 11"  (1.803 m)   Wt 201 lb 2 oz (91.2 kg)   SpO2 97%   BMI 28.05 kg/m  General: Awake, appears stated age HEENT: MMM, EOMi Heart: RRR, no murmurs Lungs: CTAB, no rales, wheezes or rhonchi. No accessory muscle use Neuro: Sensation intact to pinprick bilaterally feet Skin; other than macerated skin in between toes on the right foot, there are no skin lesions noted on his feet Psych: Normal affect and mood  Assessment and Plan: Type 2 diabetes mellitus without complication, with long-term current use of insulin (HCC) - Plan: Insulin Aspart Prot & Aspart (INSULIN ASP PROT & ASP FLEXPEN) (70-30) 100 UNIT/ML SUPN, metFORMIN (GLUCOPHAGE) 500 MG tablet, CBC, Comprehensive metabolic panel, Lipid  panel, Hemoglobin A1c, HM DIABETES FOOT EXAM; encouraged to get eye exam when convenient.  Tinea pedis of right foot-ketoconazole cream for 6 weeks.  Follow-up in 6 weeks. The patient and his sister voiced understanding and agreement to the plan.  [place], DO 10/02/18  10:54 AM

## 2018-10-26 ENCOUNTER — Other Ambulatory Visit: Payer: Self-pay | Admitting: *Deleted

## 2018-10-26 DIAGNOSIS — Z794 Long term (current) use of insulin: Secondary | ICD-10-CM

## 2018-10-26 DIAGNOSIS — M79604 Pain in right leg: Secondary | ICD-10-CM

## 2018-10-26 DIAGNOSIS — E119 Type 2 diabetes mellitus without complications: Secondary | ICD-10-CM

## 2018-10-26 MED ORDER — NAPROXEN 375 MG PO TABS: 375.0000 mg | ORAL_TABLET | Freq: Two times a day (BID) | ORAL | 0 refills | Status: DC | PRN

## 2018-10-26 MED ORDER — ATORVASTATIN CALCIUM 40 MG PO TABS: ORAL_TABLET | ORAL | 1 refills | Status: DC

## 2018-11-15 ENCOUNTER — Ambulatory Visit (INDEPENDENT_AMBULATORY_CARE_PROVIDER_SITE_OTHER): Payer: Medicare Other | Admitting: Family Medicine

## 2018-11-15 ENCOUNTER — Encounter: Payer: Self-pay | Admitting: Family Medicine

## 2018-11-15 ENCOUNTER — Other Ambulatory Visit: Payer: Self-pay

## 2018-11-15 VITALS — BP 120/80 | HR 91 | Temp 97.2°F | Ht 70.5 in | Wt 207.1 lb

## 2018-11-15 DIAGNOSIS — Z794 Long term (current) use of insulin: Secondary | ICD-10-CM

## 2018-11-15 DIAGNOSIS — Z23 Encounter for immunization: Secondary | ICD-10-CM

## 2018-11-15 DIAGNOSIS — E119 Type 2 diabetes mellitus without complications: Secondary | ICD-10-CM | POA: Diagnosis not present

## 2018-11-15 DIAGNOSIS — M25661 Stiffness of right knee, not elsewhere classified: Secondary | ICD-10-CM | POA: Diagnosis not present

## 2018-11-15 DIAGNOSIS — Z09 Encounter for follow-up examination after completed treatment for conditions other than malignant neoplasm: Secondary | ICD-10-CM | POA: Diagnosis not present

## 2018-11-15 MED ORDER — INSULIN ASP PROT & ASP FLEXPEN (70-30) 100 UNIT/ML ~~LOC~~ SUPN: 10.0000 [IU] | PEN_INJECTOR | Freq: Two times a day (BID) | SUBCUTANEOUS | 1 refills | Status: DC

## 2018-11-15 NOTE — Patient Instructions (Addendum)
Keep checking your sugars. I would like you to check in the evenings from time to time as well.  Keep the diet clean and stay active.  We are increasing your insulin from 9 units twice daily to 10 units twice daily.  Continue on 1600-1800 calorie diet. Limit carbs to 100 or less daily.   For the knee, ice it twice daily for 10 minutes. Avoid things that cause it pain. Try the exercises. If no improvement, we will inject medicine into it at your next visit.  I want you to walk 4 times per week instead of twice weekly.  Let us know if you need anything.  Knee Exercises It is normal to feel mild stretching, pulling, tightness, or discomfort as you do these exercises, but you should stop right away if you feel sudden pain or your pain gets worse. STRETCHING AND RANGE OF MOTION EXERCISES  These exercises warm up your muscles and joints and improve the movement and flexibility of your knee. These exercises also help to relieve pain, numbness, and tingling. Exercise A: Knee Extension, Prone  1. Lie on your abdomen on a bed. 2. Place your left / right knee just beyond the edge of the surface so your knee is not on the bed. You can put a towel under your left / right thigh just above your knee for comfort. 3. Relax your leg muscles and allow gravity to straighten your knee. You should feel a stretch behind your left / right knee. 4. Hold this position for 30 seconds. 5. Scoot up so your knee is supported between repetitions. Repeat 2 times. Complete this stretch 3 times per week. Exercise B: Knee Flexion, Active    1. Lie on your back with both knees straight. If this causes back discomfort, bend your left / right knee so your foot is flat on the floor. 2. Slowly slide your left / right heel back toward your buttocks until you feel a gentle stretch in the front of your knee or thigh. 3. Hold this position for 30 seconds. 4. Slowly slide your left / right heel back to the starting  position. Repeat 2 times. Complete this exercise 3 times per week. Exercise C: Quadriceps, Prone    1. Lie on your abdomen on a firm surface, such as a bed or padded floor. 2. Bend your left / right knee and hold your ankle. If you cannot reach your ankle or pant leg, loop a belt around your foot and grab the belt instead. 3. Gently pull your heel toward your buttocks. Your knee should not slide out to the side. You should feel a stretch in the front of your thigh and knee. 4. Hold this position for 30 seconds. Repeat 2 times. Complete this stretch 3 times per week. Exercise D: Hamstring, Supine  1. Lie on your back. 2. Loop a belt or towel over the ball of your left / right foot. The ball of your foot is on the walking surface, right under your toes. 3. Straighten your left / right knee and slowly pull on the belt to raise your leg until you feel a gentle stretch behind your knee. ? Do not let your left / right knee bend while you do this. ? Keep your other leg flat on the floor. 4. Hold this position for 30 seconds. Repeat 2 times. Complete this stretch 3 times per week. STRENGTHENING EXERCISES  These exercises build strength and endurance in your knee. Endurance is the ability to  use your muscles for a long time, even after they get tired. Exercise E: Quadriceps, Isometric    1. Lie on your back with your left / right leg extended and your other knee bent. Put a rolled towel or small pillow under your knee if told by your health care provider. 2. Slowly tense the muscles in the front of your left / right thigh. You should see your kneecap slide up toward your hip or see increased dimpling just above the knee. This motion will push the back of the knee toward the floor. 3. For 3 seconds, keep the muscle as tight as you can without increasing your pain. 4. Relax the muscles slowly and completely. Repeat for 10 total reps Repeat 2 ti mes. Complete this exercise 3 times per  week. Exercise F: Straight Leg Raises - Quadriceps  1. Lie on your back with your left / right leg extended and your other knee bent. 2. Tense the muscles in the front of your left / right thigh. You should see your kneecap slide up or see increased dimpling just above the knee. Your thigh may even shake a bit. 3. Keep these muscles tight as you raise your leg 4-6 inches (10-15 cm) off the floor. Do not let your knee bend. 4. Hold this position for 3 seconds. 5. Keep these muscles tense as you lower your leg. 6. Relax your muscles slowly and completely after each repetition. 10 total reps. Repeat 2 times. Complete this exercise 3 times per week.  Exercise G: Hamstring Curls    If told by your health care provider, do this exercise while wearing ankle weights. Begin with 5 lb weights (optional). Then increase the weight by 1 lb (0.5 kg) increments. Do not wear ankle weights that are more than 20 lbs to start with. 1. Lie on your abdomen with your legs straight. 2. Bend your left / right knee as far as you can without feeling pain. Keep your hips flat against the floor. 3. Hold this position for 3 seconds. 4. Slowly lower your leg to the starting position. Repeat for 10 reps.  Repeat 2 times. Complete this exercise 3 times per week. Exercise H: Squats (Quadriceps)  1. Stand in front of a table, with your feet and knees pointing straight ahead. You may rest your hands on the table for balance but not for support. 2. Slowly bend your knees and lower your hips like you are going to sit in a chair. ? Keep your weight over your heels, not over your toes. ? Keep your lower legs upright so they are parallel with the table legs. ? Do not let your hips go lower than your knees. ? Do not bend lower than told by your health care provider. ? If your knee pain increases, do not bend as low. 3. Hold the squat position for 1 second. 4. Slowly push with your legs to return to standing. Do not use your  hands to pull yourself to standing. Repeat 2 times. Complete this exercise 3 times per week. Exercise I: Wall Slides (Quadriceps)    1. Lean your back against a smooth wall or door while you walk your feet out 18-24 inches (46-61 cm) from it. 2. Place your feet hip-width apart. 3. Slowly slide down the wall or door until your knees Repeat 2 times. Complete this exercise every other day. 4. Exercise K: Straight Leg Raises - Hip Abductors  1. Lie on your side with your left / right  leg in the top position. Lie so your head, shoulder, knee, and hip line up. You may bend your bottom knee to help you keep your balance. 2. Roll your hips slightly forward so your hips are stacked directly over each other and your left / right knee is facing forward. 3. Leading with your heel, lift your top leg 4-6 inches (10-15 cm). You should feel the muscles in your outer hip lifting. ? Do not let your foot drift forward. ? Do not let your knee roll toward the ceiling. 4. Hold this position for 3 seconds. 5. Slowly return your leg to the starting position. 6. Let your muscles relax completely after each repetition. 10 total reps. Repeat 2 times. Complete this exercise 3 times per week. Exercise J: Straight Leg Raises - Hip Extensors  1. Lie on your abdomen on a firm surface. You can put a pillow under your hips if that is more comfortable. 2. Tense the muscles in your buttocks and lift your left / right leg about 4-6 inches (10-15 cm). Keep your knee straight as you lift your leg. 3. Hold this position for 3 seconds. 4. Slowly lower your leg to the starting position. 5. Let your leg relax completely after each repetition. Repeat 2 times. Complete this exercise 3 times per week. Document Released: 12/30/2004 Document Revised: 11/10/2015 Document Reviewed: 12/22/2014 Elsevier Interactive Patient Education  2017 [employer].

## 2018-11-15 NOTE — Progress Notes (Signed)
Chief Complaint  Patient presents with  . Follow-up    diabetes    Subjective: Patient is a 68 y.o. male here for f/u DM.  He lives in a group home.  He has been taking 70-30 NovoLog insulin twice daily, 9 units.  This was increased at his last visit.  Requested for dietary changes which is unclear if this happened.  Ideally would have been placed on 1600-calorie restriction with 100-110 carbs daily.  He is walking twice weekly.  His eye exam is coming up this fall.  He is taking all other medications including metformin 500 mg twice daily.  No adverse effects.  Reports compliance.  Sugars were running in 200s, now in the high 100s.  He notes since he fell several months ago, he has had medial knee pain on the right.  It is stiff in the morning and causes intermittent pain when walking.  He has not tried anything for this so far.  No bruising or swelling.  His knee does not click, catch, or lock when he moves it.  Patient had an issue with athlete's foot at his last visit.  He did finish 6 weeks of ketoconazole cream.  He reports compliance with this medication and improvement with his feet.  No itching, scaling, or white areas between his toes.  ROS: MSK: +knee pain Skin: No rashes on feet  Past Medical History:  Diagnosis Date  . Allergic rhinitis   . Allergy   . Arthritis    shoulder  . Cataract    both eyes - just watching now  . Diabetes mellitus without complication ([place])   . History of chicken pox   . History of lead poisoning   . Hyperlipidemia   . Mild cognitive impairment   . Mumps   . Uses walker    ambulates with walker    Objective: BP 120/80 (BP Location: Left Arm, Patient Position: Sitting, Cuff Size: Normal)   Pulse 91   Temp (!) 97.2 F (36.2 C) (Temporal)   Ht 5' 10.5" (1.791 m)   Wt 207 lb 2 oz (94 kg)   SpO2 95%   BMI 29.30 kg/m  General: Awake, appears stated age HEENT: MMM, EOMi Heart: RRR, no murmurs Lungs: CTAB, no rales, wheezes or rhonchi.  No accessory muscle use MSK: +TTP over medial fem condyle on R. Normal ROM. No jt line ttp. Skin: No evidence of scaling/maceration of R foot Psych: normal affect and mood  Assessment and Plan: Type 2 diabetes mellitus without complication, with long-term current use of insulin (HCC) - Plan: Insulin Aspart Prot & Aspart (INSULIN ASP PROT & ASP FLEXPEN) (70-30) 100 UNIT/ML SUPN  Follow-up for resolved condition  Knee stiffness, right  1- increase to 10 u bid from 9 u, counseled on diet and exercise. 4x/week walking from 2x/week. 2- stop ketoconazole. 3- stretches/exercises, activity as tolerated, ice. If no improvement, will inject. F/u in 7 weeks. The patient voiced understanding and agreement to the plan.  [place], DO 11/15/18  9:18 AM

## 2018-11-20 ENCOUNTER — Other Ambulatory Visit: Payer: Self-pay | Admitting: Family Medicine

## 2018-11-20 DIAGNOSIS — E119 Type 2 diabetes mellitus without complications: Secondary | ICD-10-CM

## 2018-11-20 DIAGNOSIS — Z794 Long term (current) use of insulin: Secondary | ICD-10-CM

## 2018-11-20 MED ORDER — ATORVASTATIN CALCIUM 40 MG PO TABS: ORAL_TABLET | ORAL | 1 refills | Status: DC

## 2018-11-20 NOTE — Telephone Encounter (Signed)
Requested medication (s) are due for refill today: yes  Requested medication (s) are on the active medication list: yes  Future visit scheduled: yes  Notes to clinic:  Pt is out of medication. Pt's assisted living community did not alert family pt needed a refill before he ran out. Will also need refill of Novolog   Requested Prescriptions  Pending Prescriptions Disp Refills   atorvastatin (LIPITOR) 40 MG tablet 90 tablet 1    Sig: TAKE 1 TABLET(40 MG) BY MOUTH DAILY     Cardiovascular:  Antilipid - Statins Failed - 11/20/2018  8:41 AM      Failed - HDL in normal range and within 360 days    HDL  Date Value Ref Range Status  10/02/2018 28.50 (L) >39.00 mg/dL Final         Passed - Total Cholesterol in normal range and within 360 days    Cholesterol  Date Value Ref Range Status  10/02/2018 68 0 - 200 mg/dL Final    Comment:    ATP III Classification       Desirable:  < 200 mg/dL               Borderline High:  200 - 239 mg/dL          High:  > = 240 mg/dL         Passed - LDL in normal range and within 360 days    LDL Cholesterol  Date Value Ref Range Status  10/02/2018 25 0 - 99 mg/dL Final         Passed - Triglycerides in normal range and within 360 days    Triglycerides  Date Value Ref Range Status  10/02/2018 71.0 0.0 - 149.0 mg/dL Final    Comment:    Normal:  <150 mg/dLBorderline High:  150 - 199 mg/dL         Passed - Patient is not pregnant      Passed - Valid encounter within last 12 months    Recent Outpatient Visits          5 days ago Type 2 diabetes mellitus without complication, with long-term current use of insulin ([place])   [facility], [place], DO   1 month ago Type 2 diabetes mellitus without complication, with long-term current use of insulin ([place])   [facility], [place], DO   1 month ago Type 2 diabetes mellitus without complication,  with long-term current use of insulin ([place])   [facility], [place], DO   8 months ago Mild cognitive impairment   [facility], [place], DO   10 months ago [place] [facility], [place], DO      Future Appointments            In 1 month [place], [place], [place] at [facility], [facility]

## 2018-11-20 NOTE — Telephone Encounter (Signed)
Medication Refill - Medication:   atorvastatin (LIPITOR) 40 MG tablet  Pt is out of medication. Pt's assisted living community did not alert family pt needed a refill before he ran out. Will also need refill of Novolog   Has the patient contacted their pharmacy? No. (Agent: If no, request that the patient contact the pharmacy for the refill.) (Agent: If yes, when and what did the pharmacy advise?)  Preferred Pharmacy (with phone number or street name): [id number] - [place], [place] - [address] AT [place] [phone] (Phone) [phone] (Fax)     Agent: Please be advised that RX refills may take up to 3 business days. We ask that you follow-up with your pharmacy.

## 2018-11-22 ENCOUNTER — Telehealth: Payer: Self-pay | Admitting: *Deleted

## 2018-11-22 NOTE — Telephone Encounter (Signed)
Received FL2 paperwork today 11/22/2018 and PCP signed then was faxed to [facility] fax confirmation Called the patients sister Lorriane Shire to inform paperwork received and has been faxed

## 2018-11-22 NOTE — Telephone Encounter (Signed)
Copied from [place] [id number]. Topic: General - Inquiry >> Nov 22, 2018  9:34 AM Richardo Priest, NT wrote: Reason for CRM: Patient's sister called in stating she is needing to have her brother's FL2 form filled out by today if possible as his residence has faxed over form with no reply. Patient's renewal is coming up and to continue his residence they are needing that form. Sister is wanting to know if it can be scanned and uploaded to [facility] as well as faxed on over the [facility]. Please advise.

## 2018-11-22 NOTE — Telephone Encounter (Signed)
I have not see anything either.

## 2018-11-22 NOTE — Telephone Encounter (Signed)
I have not seen this paperwork.  

## 2019-01-01 DIAGNOSIS — H02834 Dermatochalasis of left upper eyelid: Secondary | ICD-10-CM | POA: Diagnosis not present

## 2019-01-01 DIAGNOSIS — H524 Presbyopia: Secondary | ICD-10-CM | POA: Diagnosis not present

## 2019-01-01 DIAGNOSIS — H35033 Hypertensive retinopathy, bilateral: Secondary | ICD-10-CM | POA: Diagnosis not present

## 2019-01-01 DIAGNOSIS — H04123 Dry eye syndrome of bilateral lacrimal glands: Secondary | ICD-10-CM | POA: Diagnosis not present

## 2019-01-01 DIAGNOSIS — H52203 Unspecified astigmatism, bilateral: Secondary | ICD-10-CM | POA: Diagnosis not present

## 2019-01-01 DIAGNOSIS — H0100B Unspecified blepharitis left eye, upper and lower eyelids: Secondary | ICD-10-CM | POA: Diagnosis not present

## 2019-01-01 DIAGNOSIS — H2513 Age-related nuclear cataract, bilateral: Secondary | ICD-10-CM | POA: Diagnosis not present

## 2019-01-01 DIAGNOSIS — H31001 Unspecified chorioretinal scars, right eye: Secondary | ICD-10-CM | POA: Diagnosis not present

## 2019-01-01 DIAGNOSIS — Z794 Long term (current) use of insulin: Secondary | ICD-10-CM | POA: Diagnosis not present

## 2019-01-01 DIAGNOSIS — H02831 Dermatochalasis of right upper eyelid: Secondary | ICD-10-CM | POA: Diagnosis not present

## 2019-01-01 DIAGNOSIS — E119 Type 2 diabetes mellitus without complications: Secondary | ICD-10-CM | POA: Diagnosis not present

## 2019-01-01 DIAGNOSIS — H0100A Unspecified blepharitis right eye, upper and lower eyelids: Secondary | ICD-10-CM | POA: Diagnosis not present

## 2019-01-03 ENCOUNTER — Encounter: Payer: Self-pay | Admitting: Family Medicine

## 2019-01-03 ENCOUNTER — Ambulatory Visit (INDEPENDENT_AMBULATORY_CARE_PROVIDER_SITE_OTHER): Payer: Medicare Other | Admitting: Family Medicine

## 2019-01-03 ENCOUNTER — Other Ambulatory Visit: Payer: Self-pay

## 2019-01-03 ENCOUNTER — Telehealth: Payer: Self-pay | Admitting: Family Medicine

## 2019-01-03 VITALS — BP 130/82 | HR 98 | Temp 96.7°F | Ht 71.0 in | Wt 210.1 lb

## 2019-01-03 DIAGNOSIS — M542 Cervicalgia: Secondary | ICD-10-CM

## 2019-01-03 DIAGNOSIS — E1165 Type 2 diabetes mellitus with hyperglycemia: Secondary | ICD-10-CM

## 2019-01-03 LAB — HEMOGLOBIN A1C: Hgb A1c MFr Bld: 8.6 % — ABNORMAL HIGH (ref 4.6–6.5)

## 2019-01-03 MED ORDER — CYCLOBENZAPRINE HCL 10 MG PO TABS: 5.0000 mg | ORAL_TABLET | Freq: Three times a day (TID) | ORAL | 0 refills | Status: DC | PRN

## 2019-01-03 NOTE — Telephone Encounter (Signed)
Full tab. If it makes him drowsy, this will change to only give 1/2 tab. Ty.

## 2019-01-03 NOTE — Addendum Note (Signed)
Addended by: Caffie Pinto on: 01/03/2019 09:49 AM   Modules accepted: Orders

## 2019-01-03 NOTE — Telephone Encounter (Signed)
Alegra from [facility].  Pt arrived back at facility with RX for cyclobenzaprine (FLEXERIL) 10 MG tablet.  States that they need specific orders - RX can not say take 1/2-1 tab, it must give the exact amount they can dispense to pt.

## 2019-01-03 NOTE — Progress Notes (Signed)
Subjective:   Chief Complaint  Patient presents with  . Follow-up    Phillip Richard is a 68 y.o. male here for follow-up of diabetes.  Here w sister. [place]'s self monitored glucose range is low to mid 100s.  Patient denies hypoglycemic reactions. He checks his glucose levels around once per day. Patient does require insulin.   Medications include: Metformin 500 mg bid Diet is fair. Reports it is good.  Exercise: Walking 4x/week.   2-3 d ago, woke up with L sided neck pain after sleeping on it wrong. Aching pain with little radiation. Has not done anything at home. No neurologic s/s's. No arm issues.   Past Medical History:  Diagnosis Date  . Allergic rhinitis   . Allergy   . Arthritis    shoulder  . Cataract    both eyes - just watching now  . Diabetes mellitus without complication ([place])   . History of chicken pox   . History of lead poisoning   . Hyperlipidemia   . Mild cognitive impairment   . Mumps   . Uses walker    ambulates with walker     Related testing: Date of retinal exam: Done Pneumovax: done Flu Shot: done  Review of Systems: Pulmonary:  No SOB Cardiovascular:  No chest pain  Objective:  BP 130/82 (BP Location: Left Arm, Patient Position: Sitting, Cuff Size: Normal)   Pulse 98   Temp (!) 96.7 F (35.9 C) (Temporal)   Ht 5\' 11"  (1.803 m)   Wt 210 lb 2 oz (95.3 kg)   SpO2 96%   BMI 29.31 kg/m  General:  Well developed, well nourished, in no apparent distress Skin:  Warm, no pallor or diaphoresis Head:  Normocephalic, atraumatic Eyes:  Pupils equal and round, sclera anicteric without injection  Lungs:  CTAB, no access msc use Cardio:  RRR, no bruits, no LE edema Musculoskeletal:  Symmetrical muscle groups noted without atrophy or deformity Neuro:  DTR's equal and symmetric in UE's, 5/5 strength in UE's Psych: Nml affect and mood  Assessment:   Uncontrolled type 2 diabetes mellitus with hyperglycemia (HCC) - Plan: Microalbumin /  creatinine urine ratio, Hemoglobin A1c  Neck pain - Plan: cyclobenzaprine (FLEXERIL) 10 MG tablet   Plan:   Orders as above. Counseled on diet and exercise. Might need to ck sugars in evenings if not controlled. Would increase AM dose of 70/30.  Stretches/exercises, heat, Tylenol for neck.  F/u in 3 mo. The patient and his sister voiced understanding and agreement to the plan.  [place], DO 01/03/19 8:59 AM

## 2019-01-03 NOTE — Patient Instructions (Addendum)
Give [place] 2-3 business days to get the results of your labs back.   Keep the diet clean and stay active.  As of now, check your blood sugars every 1-2 days. Alternate checking in the morning and in the afternoon/evening. If your sugars are controlled based on our labs today, we will have you back off on the frequency of testing.   Heat (pad or rice pillow in microwave) over affected area, 10-15 minutes twice daily.   OK to take Tylenol 1000 mg (2 extra strength tabs) or 975 mg (3 regular strength tabs) every 6 hours as needed.  Take Flexeril (cyclobenzaprine) 1-2 hours before planned bedtime. If it makes you drowsy, do not take during the day. You can try half a tab the following night.   EXERCISES RANGE OF MOTION (ROM) AND STRETCHING EXERCISES  These exercises may help you when beginning to rehabilitate your issue. In order to successfully resolve your symptoms, you must improve your posture. These exercises are designed to help reduce the forward-head and rounded-shoulder posture which contributes to this condition. Your symptoms may resolve with or without further involvement from your physician, physical therapist or athletic trainer. While completing these exercises, remember:   Restoring tissue flexibility helps normal motion to return to the joints. This allows healthier, less painful movement and activity.  An effective stretch should be held for at least 20 seconds, although you may need to begin with shorter hold times for comfort.  A stretch should never be painful. You should only feel a gentle lengthening or release in the stretched tissue.  Do not do any stretch or exercise that you cannot tolerate.  STRETCH- Axial Extensors  Lie on your back on the floor. You may bend your knees for comfort. Place a rolled-up hand towel or dish towel, about 2 inches in diameter, under the part of your head that makes contact with the floor.  Gently tuck your chin, as if trying to make a  "double chin," until you feel a gentle stretch at the base of your head.  Hold 15-20 seconds. Repeat 2-3 times. Complete this exercise 1 time per day.   STRETCH - Axial Extension   Stand or sit on a firm surface. Assume a good posture: chest up, shoulders drawn back, abdominal muscles slightly tense, knees unlocked (if standing) and feet hip width apart.  Slowly retract your chin so your head slides back and your chin slightly lowers. Continue to look straight ahead.  You should feel a gentle stretch in the back of your head. Be certain not to feel an aggressive stretch since this can cause headaches later.  Hold for 15-20 seconds. Repeat 2-3 times. Complete this exercise 1 time per day.  STRETCH - Cervical Side Bend   Stand or sit on a firm surface. Assume a good posture: chest up, shoulders drawn back, abdominal muscles slightly tense, knees unlocked (if standing) and feet hip width apart.  Without letting your nose or shoulders move, slowly tip your right / left ear to your shoulder until your feel a gentle stretch in the muscles on the opposite side of your neck.  Hold 15-20 seconds. Repeat 2-3 times. Complete this exercise 1-2 times per day.  STRETCH - Cervical Rotators   Stand or sit on a firm surface. Assume a good posture: chest up, shoulders drawn back, abdominal muscles slightly tense, knees unlocked (if standing) and feet hip width apart.  Keeping your eyes level with the ground, slowly turn your head until you  feel a gentle stretch along the back and opposite side of your neck.  Hold 15-20 seconds. Repeat 2-3 times. Complete this exercise 1-2 times per day.  RANGE OF MOTION - Neck Circles   Stand or sit on a firm surface. Assume a good posture: chest up, shoulders drawn back, abdominal muscles slightly tense, knees unlocked (if standing) and feet hip width apart.  Gently roll your head down and around from the back of one shoulder to the back of the other. The motion  should never be forced or painful.  Repeat the motion 10-20 times, or until you feel the neck muscles relax and loosen. Repeat 2-3 times. Complete the exercise 1-2 times per day. STRENGTHENING EXERCISES - Cervical Strain and Sprain These exercises may help you when beginning to rehabilitate your injury. They may resolve your symptoms with or without further involvement from your physician, physical therapist, or athletic trainer. While completing these exercises, remember:   Muscles can gain both the endurance and the strength needed for everyday activities through controlled exercises.  Complete these exercises as instructed by your physician, physical therapist, or athletic trainer. Progress the resistance and repetitions only as guided.  You may experience muscle soreness or fatigue, but the pain or discomfort you are trying to eliminate should never worsen during these exercises. If this pain does worsen, stop and make certain you are following the directions exactly. If the pain is still present after adjustments, discontinue the exercise until you can discuss the trouble with your clinician.  STRENGTH - Cervical Flexors, Isometric  Face a wall, standing about 6 inches away. Place a small pillow, a ball about 6-8 inches in diameter, or a folded towel between your forehead and the wall.  Slightly tuck your chin and gently push your forehead into the soft object. Push only with mild to moderate intensity, building up tension gradually. Keep your jaw and forehead relaxed.  Hold 10 to 20 seconds. Keep your breathing relaxed.  Release the tension slowly. Relax your neck muscles completely before you start the next repetition. Repeat 2-3 times. Complete this exercise 1 time per day.  STRENGTH- Cervical Lateral Flexors, Isometric   Stand about 6 inches away from a wall. Place a small pillow, a ball about 6-8 inches in diameter, or a folded towel between the side of your head and the wall.   Slightly tuck your chin and gently tilt your head into the soft object. Push only with mild to moderate intensity, building up tension gradually. Keep your jaw and forehead relaxed.  Hold 10 to 20 seconds. Keep your breathing relaxed.  Release the tension slowly. Relax your neck muscles completely before you start the next repetition. Repeat 2-3 times. Complete this exercise 1 time per day.  STRENGTH - Cervical Extensors, Isometric   Stand about 6 inches away from a wall. Place a small pillow, a ball about 6-8 inches in diameter, or a folded towel between the back of your head and the wall.  Slightly tuck your chin and gently tilt your head back into the soft object. Push only with mild to moderate intensity, building up tension gradually. Keep your jaw and forehead relaxed.  Hold 10 to 20 seconds. Keep your breathing relaxed.  Release the tension slowly. Relax your neck muscles completely before you start the next repetition. Repeat 2-3 times. Complete this exercise 1 time per day.  POSTURE AND BODY MECHANICS CONSIDERATIONS Keeping correct posture when sitting, standing or completing your activities will reduce the stress put  on different body tissues, allowing injured tissues a chance to heal and limiting painful experiences. The following are general guidelines for improved posture. Your physician or physical therapist will provide you with any instructions specific to your needs. While reading these guidelines, remember:  The exercises prescribed by your provider will help you have the flexibility and strength to maintain correct postures.  The correct posture provides the optimal environment for your joints to work. All of your joints have less wear and tear when properly supported by a spine with good posture. This means you will experience a healthier, less painful body.  Correct posture must be practiced with all of your activities, especially prolonged sitting and standing. Correct  posture is as important when doing repetitive low-stress activities (typing) as it is when doing a single heavy-load activity (lifting).  PROLONGED STANDING WHILE SLIGHTLY LEANING FORWARD When completing a task that requires you to lean forward while standing in one place for a long time, place either foot up on a stationary 2- to 4-inch high object to help maintain the best posture. When both feet are on the ground, the low back tends to lose its slight inward curve. If this curve flattens (or becomes too large), then the back and your other joints will experience too much stress, fatigue more quickly, and can cause pain.   RESTING POSITIONS Consider which positions are most painful for you when choosing a resting position. If you have pain with flexion-based activities (sitting, bending, stooping, squatting), choose a position that allows you to rest in a less flexed posture. You would want to avoid curling into a fetal position on your side. If your pain worsens with extension-based activities (prolonged standing, working overhead), avoid resting in an extended position such as sleeping on your stomach. Most people will find more comfort when they rest with their spine in a more neutral position, neither too rounded nor too arched. Lying on a non-sagging bed on your side with a pillow between your knees, or on your back with a pillow under your knees will often provide some relief. Keep in mind, being in any one position for a prolonged period of time, no matter how correct your posture, can still lead to stiffness.  WALKING Walk with an upright posture. Your ears, shoulders, and hips should all line up. OFFICE WORK When working at a desk, create an environment that supports good, upright posture. Without extra support, muscles fatigue and lead to excessive strain on joints and other tissues.  CHAIR:  A chair should be able to slide under your desk when your back makes contact with the back of the  chair. This allows you to work closely.  The chair's height should allow your eyes to be level with the upper part of your monitor and your hands to be slightly lower than your elbows.  Body position: ? Your feet should make contact with the floor. If this is not possible, use a foot rest. ? Keep your ears over your shoulders. This will reduce stress on your neck and low back.

## 2019-01-03 NOTE — Telephone Encounter (Signed)
Called RN at [facility] informed of clarifications. Faxed over to them for their records this message

## 2019-01-24 ENCOUNTER — Other Ambulatory Visit: Payer: Self-pay

## 2019-01-30 ENCOUNTER — Other Ambulatory Visit: Payer: Self-pay | Admitting: Family Medicine

## 2019-01-30 DIAGNOSIS — Z794 Long term (current) use of insulin: Secondary | ICD-10-CM

## 2019-01-30 MED ORDER — METFORMIN HCL 500 MG PO TABS: 500.0000 mg | ORAL_TABLET | Freq: Two times a day (BID) | ORAL | 3 refills | Status: DC

## 2019-03-09 DIAGNOSIS — Z20828 Contact with and (suspected) exposure to other viral communicable diseases: Secondary | ICD-10-CM | POA: Diagnosis not present

## 2019-03-21 DIAGNOSIS — Z23 Encounter for immunization: Secondary | ICD-10-CM | POA: Diagnosis not present

## 2019-03-23 ENCOUNTER — Encounter: Payer: Self-pay | Admitting: Family Medicine

## 2019-03-26 ENCOUNTER — Other Ambulatory Visit: Payer: Self-pay

## 2019-03-26 ENCOUNTER — Encounter: Payer: Self-pay | Admitting: Family

## 2019-03-26 ENCOUNTER — Telehealth: Payer: Self-pay | Admitting: Family

## 2019-03-26 ENCOUNTER — Ambulatory Visit (INDEPENDENT_AMBULATORY_CARE_PROVIDER_SITE_OTHER): Payer: Medicare Other | Admitting: Family

## 2019-03-26 DIAGNOSIS — R109 Unspecified abdominal pain: Secondary | ICD-10-CM | POA: Diagnosis not present

## 2019-03-26 DIAGNOSIS — M25519 Pain in unspecified shoulder: Secondary | ICD-10-CM | POA: Diagnosis not present

## 2019-03-26 NOTE — Progress Notes (Signed)
Virtual Visit via Telephone Note  I connected with Phillip Richard on 03/26/19 at 10:00 AM EST by telephone and verified that I am speaking with the correct person using two identifiers.  Location: Patient: home Provider: home   I discussed the limitations, risks, security and privacy concerns of performing an evaluation and management service by telephone and the availability of in person appointments. I also discussed with the patient that there may be a patient responsible charge related to this service. The patient expressed understanding and agreed to proceed.   History of Present Illness:  Patient is a 69 yr old male who presents today with chief complaint of constipation.  Reports that symptoms began on Saturday.  Patient reports that his last BM was sometime last week, probably been about 5 days ago (though he is not sure about this).   He uses a walker.  He has tried hot water, raisins for constipation.  He reports some left sided abdominal discomfort. Reports some left sided abdominal tenderness.  Reports that he has been eating and drinking OK.    He also complains of right sided shoulder pain and wrist pain- .  Some pain on the left as well.     Past Medical History:  Diagnosis Date  . Allergic rhinitis   . Allergy   . Arthritis    shoulder  . Cataract    both eyes - just watching now  . Diabetes mellitus without complication ([place])   . History of chicken pox   . History of lead poisoning   . Hyperlipidemia   . Mild cognitive impairment   . Mumps   . Uses walker    ambulates with walker     Social History   Socioeconomic History  . Marital status: Widowed    Spouse name: Not on file  . Number of children: Not on file  . Years of education: Not on file  . Highest education level: Not on file  Occupational History  . Not on file  Tobacco Use  . Smoking status: Former Smoker    Packs/day: 0.25    Years: 50.00    Pack years: 12.50    Types: Cigarettes, Cigars     Start date: 1968    Quit date: 08/2017    Years since quitting: 1.5  . Smokeless tobacco: Never Used  Substance and Sexual Activity  . Alcohol use: Not Currently    Alcohol/week: 2.0 standard drinks    Types: 2 Cans of beer per week    Comment: occasionally - none since 08/2017  . Drug use: Not Currently    Types: "Crack" cocaine    Comment: For pain  Patient states "last use years ago"  . Sexual activity: Not Currently  Other Topics Concern  . Not on file  Social History Narrative  . Not on file   Social Determinants of Health   Financial Resource Strain:   . Difficulty of Paying Living Expenses: Not on file  Food Insecurity:   . Worried About Charity fundraiser in the Last Year: Not on file  . Ran Out of Food in the Last Year: Not on file  Transportation Needs:   . Lack of Transportation (Medical): Not on file  . Lack of Transportation (Non-Medical): Not on file  Physical Activity:   . Days of Exercise per Week: Not on file  . Minutes of Exercise per Session: Not on file  Stress:   . Feeling of Stress : Not on file  Social Connections:   . Frequency of Communication with Friends and Family: Not on file  . Frequency of Social Gatherings with Friends and Family: Not on file  . Attends Religious Services: Not on file  . Active Member of Clubs or Organizations: Not on file  . Attends Archivist Meetings: Not on file  . Marital Status: Not on file  Intimate Partner Violence:   . Fear of Current or Ex-Partner: Not on file  . Emotionally Abused: Not on file  . Physically Abused: Not on file  . Sexually Abused: Not on file    Past Surgical History:  Procedure Laterality Date  . HERNIA REPAIR     Umbilical & Inguinal  . WISDOM TOOTH EXTRACTION      Family History  Problem Relation Age of Onset  . Diabetes Mother   . Other Father        Unknown  . Rectal cancer Sister   . Thyroid disease Sister   . Leukemia Brother   . Cancer Brother        Other   . Diabetes Brother   . Diabetes Maternal Aunt   . Diabetes Maternal Uncle   . Healthy Son        x1  . Healthy Daughter        x5  . Stomach cancer Neg Hx   . Colon polyps Neg Hx   . Colon cancer Neg Hx     No Known Allergies  Current Outpatient Medications on File Prior to Visit  Medication Sig Dispense Refill  . atorvastatin (LIPITOR) 40 MG tablet TAKE 1 TABLET(40 MG) BY MOUTH DAILY 90 tablet 1  . atorvastatin (LIPITOR) 40 MG tablet TAKE 1 TABLET BY MOUTH DAILY 90 tablet 1  . bisacodyl (BISACODYL) 5 MG EC tablet Take 5 mg by mouth once. Dulcolax 5 mg tab take as directed for colonoscopy prep.    . cyclobenzaprine (FLEXERIL) 10 MG tablet Take 0.5-1 tablets (5-10 mg total) by mouth 3 (three) times daily as needed for muscle spasms. 21 tablet 0  . glucose blood test strip 1 each by Other route as needed for other. Use as instructed 100 each 1  . Insulin Aspart Prot & Aspart (INSULIN ASP PROT & ASP FLEXPEN) (70-30) 100 UNIT/ML SUPN Inject 10 Units into the skin 2 (two) times daily. 12 mL 1  . loratadine (CLARITIN) 10 MG tablet Take 1 tablet (10 mg total) by mouth daily. (Patient taking differently: Take 10 mg by mouth daily as needed. ) 90 tablet 3  . metFORMIN (GLUCOPHAGE) 500 MG tablet Take 1 tablet (500 mg total) by mouth 2 (two) times daily with a meal. 180 tablet 3  . naproxen (NAPROSYN) 375 MG tablet Take 1 tablet (375 mg total) by mouth 2 (two) times daily as needed. for pain 180 tablet 0  . NOVOFINE PLUS 32G X 4 MM MISC USE WITH NOVOLOG FLEXPEN AS DIRECTED 100 each 3  . Olopatadine HCl (PATADAY) 0.2 % SOLN Apply 1 drop to eye as needed (eye irritation). 1 drop to each eye 1 Bottle 5   No current facility-administered medications on file prior to visit.    There were no vitals taken for this visit.   Observations/Objective:  Gen: awake, alert Resp: no audible respiratory distress Neuro: poor historian.  Assessment and Plan:  Abdominal pain- ? Secondary to constipation  versus other. I recommended in patient appointment today.  Patient requests that we contact his sister.  Unfortunately the assisted living  is not allowing him to leave the facility unless it is a life-threatening emergency due to 3 new Covid cases in the facility.  Will recommend MiraLAX once daily for the next 2 days then once daily as needed.  If his symptoms worsen he is to call 911 for further evaluation.  I suspect that his shoulder pain is related to leaning on his walker.  This will need in person evaluation when he is able.    Follow Up Instructions:    I discussed the assessment and treatment plan with the patient. The patient was provided an opportunity to ask questions and all were answered. The patient agreed with the plan and demonstrated an understanding of the instructions.   The patient was advised to call back or seek an in-person evaluation if the symptoms worsen or if the condition fails to improve as anticipated.  I provided 25 minutes of non-face-to-face time during this encounter.   Nance Pear, NP

## 2019-03-26 NOTE — Telephone Encounter (Signed)
Please contact sister and try to schedule pt a face to face appointment. Initially the sister thought he could not leave the facility due to lock down but facility has OK'd him to come to an appointment.    Please give order to the ALF to initiate miralax 17 g in 8oz of liquid once daily for the next 2 days, then once daily prn.  If severe/worsening abdominal pain, fever he should go to the ED.  Otherwise, please schedule a face to face visit as soon as they are able to bring him in.

## 2019-03-26 NOTE — Telephone Encounter (Signed)
Patient's sister reports the assisted liging facility were patient resides has 3 new covid19 cases and they won't let patient come out unless is an emergency and he has to go by ems  Talked to patient's sister and she will take medication to the facility today.  Prescription with administration instructions faxed to the facility at [phone] as requested by facility director Germain Osgood

## 2019-03-27 DIAGNOSIS — Z20828 Contact with and (suspected) exposure to other viral communicable diseases: Secondary | ICD-10-CM | POA: Diagnosis not present

## 2019-04-06 ENCOUNTER — Ambulatory Visit: Payer: Medicare Other | Admitting: Family Medicine

## 2019-04-18 DIAGNOSIS — Z23 Encounter for immunization: Secondary | ICD-10-CM | POA: Diagnosis not present

## 2019-05-03 ENCOUNTER — Other Ambulatory Visit: Payer: Self-pay

## 2019-05-04 ENCOUNTER — Encounter: Payer: Self-pay | Admitting: Family Medicine

## 2019-05-04 ENCOUNTER — Other Ambulatory Visit: Payer: Self-pay | Admitting: Family Medicine

## 2019-05-04 ENCOUNTER — Ambulatory Visit (INDEPENDENT_AMBULATORY_CARE_PROVIDER_SITE_OTHER): Payer: Medicare Other | Admitting: Family Medicine

## 2019-05-04 ENCOUNTER — Other Ambulatory Visit: Payer: Self-pay

## 2019-05-04 VITALS — BP 117/80 | HR 92 | Temp 96.8°F | Ht 70.0 in | Wt 201.0 lb

## 2019-05-04 DIAGNOSIS — Z794 Long term (current) use of insulin: Secondary | ICD-10-CM | POA: Diagnosis not present

## 2019-05-04 DIAGNOSIS — E1165 Type 2 diabetes mellitus with hyperglycemia: Secondary | ICD-10-CM

## 2019-05-04 DIAGNOSIS — E119 Type 2 diabetes mellitus without complications: Secondary | ICD-10-CM

## 2019-05-04 DIAGNOSIS — R5381 Other malaise: Secondary | ICD-10-CM

## 2019-05-04 DIAGNOSIS — E785 Hyperlipidemia, unspecified: Secondary | ICD-10-CM

## 2019-05-04 LAB — COMPREHENSIVE METABOLIC PANEL
ALT: 29 U/L (ref 0–53)
AST: 27 U/L (ref 0–37)
Albumin: 3.7 g/dL (ref 3.5–5.2)
Alkaline Phosphatase: 90 U/L (ref 39–117)
BUN: 14 mg/dL (ref 6–23)
CO2: 26 mEq/L (ref 19–32)
Calcium: 9.5 mg/dL (ref 8.4–10.5)
Chloride: 107 mEq/L (ref 96–112)
Creatinine, Ser: 1.03 mg/dL (ref 0.40–1.50)
GFR: 71.24 mL/min (ref >60.00)
Glucose, Bld: 161 mg/dL — ABNORMAL HIGH (ref 70–99)
Potassium: 4.6 mEq/L (ref 3.5–5.1)
Sodium: 140 mEq/L (ref 135–145)
Total Bilirubin: 0.4 mg/dL (ref 0.2–1.2)
Total Protein: 7.4 g/dL (ref 6.0–8.3)

## 2019-05-04 LAB — LIPID PANEL
Cholesterol: 78 mg/dL (ref 0–200)
HDL: 30.4 mg/dL — ABNORMAL LOW (ref >39.00)
LDL Cholesterol: 35 mg/dL (ref 0–99)
NonHDL: 48.03
Total CHOL/HDL Ratio: 3
Triglycerides: 65 mg/dL (ref 0.0–149.0)
VLDL: 13 mg/dL (ref 0.0–40.0)

## 2019-05-04 LAB — HEMOGLOBIN A1C: Hgb A1c MFr Bld: 8.2 % — ABNORMAL HIGH (ref 4.6–6.5)

## 2019-05-04 LAB — MICROALBUMIN / CREATININE URINE RATIO
Creatinine,U: 245 mg/dL
Microalb Creat Ratio: 1.6 mg/g (ref 0.0–30.0)
Microalb, Ur: 3.8 mg/dL — ABNORMAL HIGH (ref 0.0–1.9)

## 2019-05-04 MED ORDER — ATORVASTATIN CALCIUM 40 MG PO TABS: ORAL_TABLET | ORAL | 3 refills | Status: DC

## 2019-05-04 MED ORDER — INSULIN ASP PROT & ASP FLEXPEN (70-30) 100 UNIT/ML ~~LOC~~ SUPN: 12.0000 [IU] | PEN_INJECTOR | Freq: Two times a day (BID) | SUBCUTANEOUS | 1 refills | Status: DC

## 2019-05-04 NOTE — Patient Instructions (Signed)
Give us 2-3 business days to get the results of your labs back.   Keep the diet clean and stay active.  Aim to do some physical exertion for 150 minutes per week. This is typically divided into 5 days per week, 30 minutes per day. The activity should be enough to get your heart rate up. Anything is better than nothing if you have time constraints.  Let us know if you need anything.  

## 2019-05-04 NOTE — Progress Notes (Signed)
Subjective:   Chief Complaint  Patient presents with  . Follow-up    Phillip Richard is a 69 y.o. male here for follow-up of diabetes.   Phillip Richard's self monitored glucose range is mid 100's.  Patient denies hypoglycemic reactions. He checks his glucose levels daily.  Patient does require insulin.   Medications include: metformin 500 mg bid Diet is fair. Exercise: none  Hyperlipidemia Patient presents for dyslipidemia follow up. Currently being treated with Lipitor 40 mg/d and compliance with treatment thus far has been good. He denies myalgias. Diet/exercise as above. The patient is not known to have coexisting coronary artery disease.  Past Medical History:  Diagnosis Date  . Allergic rhinitis   . Allergy   . Arthritis    shoulder  . Cataract    both eyes - just watching now  . Diabetes mellitus without complication ([place])   . History of chicken pox   . History of lead poisoning   . Hyperlipidemia   . Mild cognitive impairment   . Mumps   . Uses walker    ambulates with walker     Related testing: Date of retinal exam: Done Pneumovax: done Flu Shot: done  Review of Systems: Pulmonary:  No SOB Cardiovascular:  No chest pain  Objective:  BP 117/80 (BP Location: Left Arm, Patient Position: Sitting, Cuff Size: Normal)   Pulse 92   Temp (!) 96.8 F (36 C) (Temporal)   Ht 5\' 10"  (1.778 m)   Wt 201 lb (91.2 kg)   SpO2 96%   BMI 28.84 kg/m  General:  Well developed, well nourished, in no apparent distress Head:  Normocephalic, atraumatic Eyes:  Pupils equal and round, sclera anicteric without injection  Lungs:  CTAB, no access msc use Cardio:  RRR, no bruits, no LE edema Psych: Normal affect and mood  Assessment:   Type 2 diabetes mellitus with hyperglycemia, with long-term current use of insulin (HCC) - Plan: atorvastatin (LIPITOR) 40 MG tablet, Microalbumin / creatinine urine ratio, Hemoglobin A1c, Lipid panel, Comprehensive metabolic  panel  Dyslipidemia  Physical deconditioning - Plan: Ambulatory referral to Physical Therapy   Plan:   Home readings suggest control. Counseled on diet and exercise. Cont Lipitor. Refer PT.  F/u in 6 mo pending above. The patient voiced understanding and agreement to the plan.  [place], DO 05/04/19 8:47 AM

## 2019-05-08 DIAGNOSIS — G3184 Mild cognitive impairment, so stated: Secondary | ICD-10-CM | POA: Diagnosis not present

## 2019-05-08 DIAGNOSIS — E1165 Type 2 diabetes mellitus with hyperglycemia: Secondary | ICD-10-CM | POA: Diagnosis not present

## 2019-05-08 DIAGNOSIS — R5381 Other malaise: Secondary | ICD-10-CM | POA: Diagnosis not present

## 2019-05-08 DIAGNOSIS — Z7984 Long term (current) use of oral hypoglycemic drugs: Secondary | ICD-10-CM | POA: Diagnosis not present

## 2019-05-08 DIAGNOSIS — E785 Hyperlipidemia, unspecified: Secondary | ICD-10-CM | POA: Diagnosis not present

## 2019-05-09 ENCOUNTER — Telehealth: Payer: Self-pay | Admitting: Family Medicine

## 2019-05-09 NOTE — Telephone Encounter (Signed)
Called informed HHRN of PCP verbal ok °

## 2019-05-09 NOTE — Telephone Encounter (Signed)
Barb Merino [phone] (okay to leave msg)   [place]   Requesting for a on Physical Therapy Verbal for    2 times a week for 6 weeks  1 times a week for 3 week

## 2019-05-11 DIAGNOSIS — E785 Hyperlipidemia, unspecified: Secondary | ICD-10-CM | POA: Diagnosis not present

## 2019-05-11 DIAGNOSIS — G3184 Mild cognitive impairment, so stated: Secondary | ICD-10-CM | POA: Diagnosis not present

## 2019-05-11 DIAGNOSIS — E1165 Type 2 diabetes mellitus with hyperglycemia: Secondary | ICD-10-CM | POA: Diagnosis not present

## 2019-05-11 DIAGNOSIS — Z7984 Long term (current) use of oral hypoglycemic drugs: Secondary | ICD-10-CM | POA: Diagnosis not present

## 2019-05-11 DIAGNOSIS — R5381 Other malaise: Secondary | ICD-10-CM | POA: Diagnosis not present

## 2019-05-15 DIAGNOSIS — G3184 Mild cognitive impairment, so stated: Secondary | ICD-10-CM | POA: Diagnosis not present

## 2019-05-15 DIAGNOSIS — E785 Hyperlipidemia, unspecified: Secondary | ICD-10-CM | POA: Diagnosis not present

## 2019-05-15 DIAGNOSIS — Z7984 Long term (current) use of oral hypoglycemic drugs: Secondary | ICD-10-CM | POA: Diagnosis not present

## 2019-05-15 DIAGNOSIS — E1165 Type 2 diabetes mellitus with hyperglycemia: Secondary | ICD-10-CM | POA: Diagnosis not present

## 2019-05-15 DIAGNOSIS — R5381 Other malaise: Secondary | ICD-10-CM | POA: Diagnosis not present

## 2019-05-17 DIAGNOSIS — E785 Hyperlipidemia, unspecified: Secondary | ICD-10-CM | POA: Diagnosis not present

## 2019-05-17 DIAGNOSIS — R5381 Other malaise: Secondary | ICD-10-CM | POA: Diagnosis not present

## 2019-05-17 DIAGNOSIS — Z7984 Long term (current) use of oral hypoglycemic drugs: Secondary | ICD-10-CM | POA: Diagnosis not present

## 2019-05-17 DIAGNOSIS — G3184 Mild cognitive impairment, so stated: Secondary | ICD-10-CM | POA: Diagnosis not present

## 2019-05-17 DIAGNOSIS — E1165 Type 2 diabetes mellitus with hyperglycemia: Secondary | ICD-10-CM | POA: Diagnosis not present

## 2019-05-22 DIAGNOSIS — G3184 Mild cognitive impairment, so stated: Secondary | ICD-10-CM | POA: Diagnosis not present

## 2019-05-22 DIAGNOSIS — E1165 Type 2 diabetes mellitus with hyperglycemia: Secondary | ICD-10-CM | POA: Diagnosis not present

## 2019-05-22 DIAGNOSIS — E785 Hyperlipidemia, unspecified: Secondary | ICD-10-CM | POA: Diagnosis not present

## 2019-05-22 DIAGNOSIS — R5381 Other malaise: Secondary | ICD-10-CM | POA: Diagnosis not present

## 2019-05-22 DIAGNOSIS — Z7984 Long term (current) use of oral hypoglycemic drugs: Secondary | ICD-10-CM | POA: Diagnosis not present

## 2019-05-24 DIAGNOSIS — E785 Hyperlipidemia, unspecified: Secondary | ICD-10-CM | POA: Diagnosis not present

## 2019-05-24 DIAGNOSIS — G3184 Mild cognitive impairment, so stated: Secondary | ICD-10-CM | POA: Diagnosis not present

## 2019-05-24 DIAGNOSIS — Z7984 Long term (current) use of oral hypoglycemic drugs: Secondary | ICD-10-CM | POA: Diagnosis not present

## 2019-05-24 DIAGNOSIS — R5381 Other malaise: Secondary | ICD-10-CM | POA: Diagnosis not present

## 2019-05-24 DIAGNOSIS — E1165 Type 2 diabetes mellitus with hyperglycemia: Secondary | ICD-10-CM | POA: Diagnosis not present

## 2019-05-29 ENCOUNTER — Telehealth: Payer: Self-pay | Admitting: Family Medicine

## 2019-05-29 DIAGNOSIS — G3184 Mild cognitive impairment, so stated: Secondary | ICD-10-CM | POA: Diagnosis not present

## 2019-05-29 DIAGNOSIS — Z7984 Long term (current) use of oral hypoglycemic drugs: Secondary | ICD-10-CM | POA: Diagnosis not present

## 2019-05-29 DIAGNOSIS — R5381 Other malaise: Secondary | ICD-10-CM | POA: Diagnosis not present

## 2019-05-29 DIAGNOSIS — E785 Hyperlipidemia, unspecified: Secondary | ICD-10-CM | POA: Diagnosis not present

## 2019-05-29 DIAGNOSIS — E1165 Type 2 diabetes mellitus with hyperglycemia: Secondary | ICD-10-CM | POA: Diagnosis not present

## 2019-05-29 NOTE — Telephone Encounter (Signed)
Called informed HHRN of pcp instructions.

## 2019-05-29 NOTE — Telephone Encounter (Signed)
Phillip Richard saw patient for right knee and joint pain, patient fall on to his knee    Verbal order needed   Use ice for patient knee for 28mins for every few hours as needed.   If you get voice mail leave message

## 2019-05-31 DIAGNOSIS — Z7984 Long term (current) use of oral hypoglycemic drugs: Secondary | ICD-10-CM | POA: Diagnosis not present

## 2019-05-31 DIAGNOSIS — R5381 Other malaise: Secondary | ICD-10-CM | POA: Diagnosis not present

## 2019-05-31 DIAGNOSIS — G3184 Mild cognitive impairment, so stated: Secondary | ICD-10-CM | POA: Diagnosis not present

## 2019-05-31 DIAGNOSIS — E1165 Type 2 diabetes mellitus with hyperglycemia: Secondary | ICD-10-CM | POA: Diagnosis not present

## 2019-05-31 DIAGNOSIS — E785 Hyperlipidemia, unspecified: Secondary | ICD-10-CM | POA: Diagnosis not present

## 2019-06-05 DIAGNOSIS — G3184 Mild cognitive impairment, so stated: Secondary | ICD-10-CM | POA: Diagnosis not present

## 2019-06-05 DIAGNOSIS — E785 Hyperlipidemia, unspecified: Secondary | ICD-10-CM | POA: Diagnosis not present

## 2019-06-05 DIAGNOSIS — Z7984 Long term (current) use of oral hypoglycemic drugs: Secondary | ICD-10-CM | POA: Diagnosis not present

## 2019-06-05 DIAGNOSIS — R5381 Other malaise: Secondary | ICD-10-CM | POA: Diagnosis not present

## 2019-06-05 DIAGNOSIS — E1165 Type 2 diabetes mellitus with hyperglycemia: Secondary | ICD-10-CM | POA: Diagnosis not present

## 2019-06-07 DIAGNOSIS — E1165 Type 2 diabetes mellitus with hyperglycemia: Secondary | ICD-10-CM | POA: Diagnosis not present

## 2019-06-07 DIAGNOSIS — Z7984 Long term (current) use of oral hypoglycemic drugs: Secondary | ICD-10-CM | POA: Diagnosis not present

## 2019-06-07 DIAGNOSIS — G3184 Mild cognitive impairment, so stated: Secondary | ICD-10-CM | POA: Diagnosis not present

## 2019-06-07 DIAGNOSIS — R5381 Other malaise: Secondary | ICD-10-CM | POA: Diagnosis not present

## 2019-06-07 DIAGNOSIS — E785 Hyperlipidemia, unspecified: Secondary | ICD-10-CM | POA: Diagnosis not present

## 2019-06-12 ENCOUNTER — Other Ambulatory Visit: Payer: Self-pay | Admitting: Family Medicine

## 2019-06-12 DIAGNOSIS — E785 Hyperlipidemia, unspecified: Secondary | ICD-10-CM | POA: Diagnosis not present

## 2019-06-12 DIAGNOSIS — E119 Type 2 diabetes mellitus without complications: Secondary | ICD-10-CM

## 2019-06-12 DIAGNOSIS — G3184 Mild cognitive impairment, so stated: Secondary | ICD-10-CM | POA: Diagnosis not present

## 2019-06-12 DIAGNOSIS — Z7984 Long term (current) use of oral hypoglycemic drugs: Secondary | ICD-10-CM | POA: Diagnosis not present

## 2019-06-12 DIAGNOSIS — E1165 Type 2 diabetes mellitus with hyperglycemia: Secondary | ICD-10-CM | POA: Diagnosis not present

## 2019-06-12 DIAGNOSIS — R5381 Other malaise: Secondary | ICD-10-CM | POA: Diagnosis not present

## 2019-06-12 DIAGNOSIS — Z794 Long term (current) use of insulin: Secondary | ICD-10-CM

## 2019-06-12 MED ORDER — INSULIN ASP PROT & ASP FLEXPEN (70-30) 100 UNIT/ML ~~LOC~~ SUPN: 12.0000 [IU] | PEN_INJECTOR | Freq: Two times a day (BID) | SUBCUTANEOUS | 1 refills | Status: DC

## 2019-06-14 DIAGNOSIS — E785 Hyperlipidemia, unspecified: Secondary | ICD-10-CM | POA: Diagnosis not present

## 2019-06-14 DIAGNOSIS — R5381 Other malaise: Secondary | ICD-10-CM | POA: Diagnosis not present

## 2019-06-14 DIAGNOSIS — G3184 Mild cognitive impairment, so stated: Secondary | ICD-10-CM | POA: Diagnosis not present

## 2019-06-14 DIAGNOSIS — E1165 Type 2 diabetes mellitus with hyperglycemia: Secondary | ICD-10-CM | POA: Diagnosis not present

## 2019-06-14 DIAGNOSIS — Z7984 Long term (current) use of oral hypoglycemic drugs: Secondary | ICD-10-CM | POA: Diagnosis not present

## 2019-06-19 DIAGNOSIS — E1165 Type 2 diabetes mellitus with hyperglycemia: Secondary | ICD-10-CM | POA: Diagnosis not present

## 2019-06-19 DIAGNOSIS — G3184 Mild cognitive impairment, so stated: Secondary | ICD-10-CM | POA: Diagnosis not present

## 2019-06-19 DIAGNOSIS — R5381 Other malaise: Secondary | ICD-10-CM | POA: Diagnosis not present

## 2019-06-19 DIAGNOSIS — Z7984 Long term (current) use of oral hypoglycemic drugs: Secondary | ICD-10-CM | POA: Diagnosis not present

## 2019-06-19 DIAGNOSIS — E785 Hyperlipidemia, unspecified: Secondary | ICD-10-CM | POA: Diagnosis not present

## 2019-06-26 ENCOUNTER — Ambulatory Visit: Payer: Medicare Other | Admitting: *Deleted

## 2019-06-26 ENCOUNTER — Other Ambulatory Visit: Payer: Self-pay

## 2019-06-26 ENCOUNTER — Telehealth: Payer: Self-pay | Admitting: Family Medicine

## 2019-06-26 DIAGNOSIS — R5381 Other malaise: Secondary | ICD-10-CM | POA: Diagnosis not present

## 2019-06-26 DIAGNOSIS — Z7984 Long term (current) use of oral hypoglycemic drugs: Secondary | ICD-10-CM | POA: Diagnosis not present

## 2019-06-26 DIAGNOSIS — E1165 Type 2 diabetes mellitus with hyperglycemia: Secondary | ICD-10-CM | POA: Diagnosis not present

## 2019-06-26 DIAGNOSIS — E785 Hyperlipidemia, unspecified: Secondary | ICD-10-CM | POA: Diagnosis not present

## 2019-06-26 DIAGNOSIS — G3184 Mild cognitive impairment, so stated: Secondary | ICD-10-CM | POA: Diagnosis not present

## 2019-06-26 NOTE — Telephone Encounter (Signed)
Caller : Sean Call Back # [phone]  Subject: Rt Knee Pain  Per Hilliard Clark (PT) with [facility] , patient is still having right knee pain. Per Hilliard Clark is requesting patient be referred to Orthopedic Doctor, Patient live in a assisted living.   Patient's Cell # [phone]  Please Advise

## 2019-06-26 NOTE — Progress Notes (Deleted)
Subjective:   Phillip Richard is a 69 y.o. male who presents for Medicare Annual/Subsequent preventive examination.  Review of Systems:   Home Safety/Smoke Alarms: Feels safe in home. Smoke alarms in place.   Male:   CCS-  03/30/18.  Recall 5 yrs. PSA-  Lab Results  Component Value Date   PSA 0.81 11/11/2009       Objective:    Vitals: There were no vitals taken for this visit.  There is no height or weight on file to calculate BMI.  Advanced Directives 08/04/2016 01/04/2015  Does Patient Have a Medical Advance Directive? No No  Would patient like information on creating a medical advance directive? Yes (MAU/Ambulatory/Procedural Areas - Information given) No - patient declined information    Tobacco Social History   Tobacco Use  Smoking Status Former Smoker  . Packs/day: 0.25  . Years: 50.00  . Pack years: 12.50  . Types: Cigarettes, Cigars  . Start date: 42  . Quit date: 08/2017  . Years since quitting: 1.8  Smokeless Tobacco Never Used     Counseling given: Not Answered   Clinical Intake:                       Past Medical History:  Diagnosis Date  . Allergic rhinitis   . Allergy   . Arthritis    shoulder  . Cataract    both eyes - just watching now  . Diabetes mellitus without complication ([place])   . History of chicken pox   . History of lead poisoning   . Hyperlipidemia   . Mild cognitive impairment   . Mumps   . Uses walker    ambulates with walker   Past Surgical History:  Procedure Laterality Date  . HERNIA REPAIR     Umbilical & Inguinal  . WISDOM TOOTH EXTRACTION     Family History  Problem Relation Age of Onset  . Diabetes Mother   . Other Father        Unknown  . Rectal cancer Sister   . Thyroid disease Sister   . Leukemia Brother   . Cancer Brother        Other  . Diabetes Brother   . Diabetes Maternal Aunt   . Diabetes Maternal Uncle   . Healthy Son        x1  . Healthy Daughter        x5  . Stomach cancer  Neg Hx   . Colon polyps Neg Hx   . Colon cancer Neg Hx    Social History   Socioeconomic History  . Marital status: Widowed    Spouse name: Not on file  . Number of children: Not on file  . Years of education: Not on file  . Highest education level: Not on file  Occupational History  . Not on file  Tobacco Use  . Smoking status: Former Smoker    Packs/day: 0.25    Years: 50.00    Pack years: 12.50    Types: Cigarettes, Cigars    Start date: 1968    Quit date: 08/2017    Years since quitting: 1.8  . Smokeless tobacco: Never Used  Substance and Sexual Activity  . Alcohol use: Not Currently    Alcohol/week: 2.0 standard drinks    Types: 2 Cans of beer per week    Comment: occasionally - none since 08/2017  . Drug use: Not Currently    Types: "Crack" cocaine  Comment: For pain  Patient states "last use years ago"  . Sexual activity: Not Currently  Other Topics Concern  . Not on file  Social History Narrative  . Not on file   Social Determinants of Health   Financial Resource Strain:   . Difficulty of Paying Living Expenses:   Food Insecurity:   . Worried About Charity fundraiser in the Last Year:   . Arboriculturist in the Last Year:   Transportation Needs:   . Film/video editor (Medical):   Marland Kitchen Lack of Transportation (Non-Medical):   Physical Activity:   . Days of Exercise per Week:   . Minutes of Exercise per Session:   Stress:   . Feeling of Stress :   Social Connections:   . Frequency of Communication with Friends and Family:   . Frequency of Social Gatherings with Friends and Family:   . Attends Religious Services:   . Active Member of Clubs or Organizations:   . Attends Archivist Meetings:   Marland Kitchen Marital Status:     Outpatient Encounter Medications as of 06/26/2019  Medication Sig  . atorvastatin (LIPITOR) 40 MG tablet TAKE 1 TABLET(40 MG) BY MOUTH DAILY  . glucose blood test strip 1 each by Other route as needed for other. Use as  instructed  . Insulin Aspart Prot & Aspart (INSULIN ASP PROT & ASP FLEXPEN) (70-30) 100 UNIT/ML SUPN Inject 12 Units into the skin 2 (two) times daily.  Marland Kitchen loratadine (CLARITIN) 10 MG tablet Take 1 tablet (10 mg total) by mouth daily. (Patient taking differently: Take 10 mg by mouth daily as needed. )  . metFORMIN (GLUCOPHAGE) 500 MG tablet Take 1 tablet (500 mg total) by mouth 2 (two) times daily with a meal.  . naproxen (NAPROSYN) 375 MG tablet Take 1 tablet (375 mg total) by mouth 2 (two) times daily as needed. for pain  . NOVOFINE PLUS 32G X 4 MM MISC USE WITH NOVOLOG FLEXPEN AS DIRECTED  . Olopatadine HCl (PATADAY) 0.2 % SOLN Apply 1 drop to eye as needed (eye irritation). 1 drop to each eye   No facility-administered encounter medications on file as of 06/26/2019.    Activities of Daily Living No flowsheet data found.  Patient Care Team: Shelda Pal, DO as PCP - General (Family Medicine)   Assessment:   This is a routine wellness examination for Phillip Richard. Physical assessment deferred to PCP.   Exercise Activities and Dietary recommendations   Diet (meal preparation, eat out, water intake, caffeinated beverages, dairy products, fruits and vegetables): {Desc; diets:16563} Breakfast: Lunch:  Dinner:      Goals    . Maintain lifestyle       Fall Risk Fall Risk  01/24/2019 01/13/2018 08/04/2016 08/04/2016 10/27/2015  Falls in the past year? 0 0 No No No  Comment Emmi Telephone Survey: data to providers prior to load Emmi Telephone Survey: data to providers prior to load - - -  Risk for fall due to : - - - - Impaired balance/gait;Impaired mobility     Depression Screen PHQ 2/9 Scores 08/04/2016 08/04/2016  PHQ - 2 Score 0 0  PHQ- 9 Score - 0    Cognitive Function Ad8 score reviewed for issues:  Issues making decisions:  Less interest in hobbies / activities:  Repeats questions, stories (family complaining):  Trouble using ordinary gadgets (microwave, computer,  phone):  Forgets the month or year:   Mismanaging finances:   Remembering appts:  Daily  problems with thinking and/or memory: Ad8 score is=     MMSE - Mini Mental State Exam 08/04/2016  Orientation to time 5  Orientation to Place 4  Registration 3  Attention/ Calculation 5  Recall 3  Language- name 2 objects 2  Language- repeat 0  Language- follow 3 step command 3  Language- read & follow direction 1  Write a sentence 1  Copy design 1  Total score 28        Immunization History  Administered Date(s) Administered  . Fluad Quad(high Dose 65+) 11/15/2018  . Influenza, High Dose Seasonal PF 11/18/2016, 12/16/2017  . Influenza-Unspecified 03/02/2015  . Moderna SARS-COVID-2 Vaccination 03/21/2019, 04/12/2019  . PPD Test 11/29/2017  . Pneumococcal Conjugate-13 11/18/2016  . Pneumococcal Polysaccharide-23 11/11/2009, 12/16/2017  . Tdap 06/13/2017    Screening Tests Health Maintenance  Topic Date Due  . INFLUENZA VACCINE  09/30/2019  . FOOT EXAM  10/02/2019  . HEMOGLOBIN A1C  11/04/2019  . OPHTHALMOLOGY EXAM  01/02/2020  . URINE MICROALBUMIN  05/03/2020  . COLONOSCOPY  03/31/2023  . TETANUS/TDAP  06/14/2027  . COVID-19 Vaccine  Completed  . Hepatitis C Screening  Completed  . PNA vac Low Risk Adult  Completed       Plan:   ***  I have personally reviewed and noted the following in the patient's chart:   . Medical and social history . Use of alcohol, tobacco or illicit drugs  . Current medications and supplements . Functional ability and status . Nutritional status . Physical activity . Advanced directives . List of other physicians . Hospitalizations, surgeries, and ER visits in previous 12 months . Vitals . Screenings to include cognitive, depression, and falls . Referrals and appointments  In addition, I have reviewed and discussed with patient certain preventive protocols, quality metrics, and best practice recommendations. A written personalized  care plan for preventive services as well as general preventive health recommendations were provided to patient.     Naaman Plummer [place], [place]  06/26/2019

## 2019-06-26 NOTE — Telephone Encounter (Signed)
Sched appt plz. Ty. 

## 2019-06-26 NOTE — Telephone Encounter (Signed)
Called and scheduled appt this Friday 06/29/2019

## 2019-06-29 ENCOUNTER — Other Ambulatory Visit: Payer: Self-pay

## 2019-06-29 ENCOUNTER — Ambulatory Visit (HOSPITAL_BASED_OUTPATIENT_CLINIC_OR_DEPARTMENT_OTHER)
Admission: RE | Admit: 2019-06-29 | Discharge: 2019-06-29 | Disposition: A | Discharge status: Home / Self Care | Payer: Medicare Other | Source: Ambulatory Visit | Attending: Family Medicine | Admitting: Family Medicine

## 2019-06-29 ENCOUNTER — Ambulatory Visit (INDEPENDENT_AMBULATORY_CARE_PROVIDER_SITE_OTHER): Payer: Medicare Other | Admitting: Family Medicine

## 2019-06-29 ENCOUNTER — Encounter: Payer: Self-pay | Admitting: Family Medicine

## 2019-06-29 VITALS — BP 128/80 | HR 117 | Temp 95.1°F | Ht 70.0 in | Wt 192.1 lb

## 2019-06-29 DIAGNOSIS — G8929 Other chronic pain: Secondary | ICD-10-CM | POA: Diagnosis not present

## 2019-06-29 DIAGNOSIS — S8991XA Unspecified injury of right lower leg, initial encounter: Secondary | ICD-10-CM | POA: Diagnosis not present

## 2019-06-29 DIAGNOSIS — M25561 Pain in right knee: Secondary | ICD-10-CM | POA: Diagnosis not present

## 2019-06-29 MED ORDER — METHYLPREDNISOLONE ACETATE 40 MG/ML IJ SUSP
40.0000 mg | Freq: Once | INTRAMUSCULAR | Status: AC
  Administered 2019-06-29: 11:00:00 40 mg via INTRA_ARTICULAR

## 2019-06-29 NOTE — Patient Instructions (Addendum)
Stretch your sides by side bending. Heat can be helpful.   If your knee is no better in the next 2-3 weeks, send me a message and we will set you up with a specialist.  We will be in touch regarding your X-ray results.   Let us know if you need anything.

## 2019-06-29 NOTE — Progress Notes (Signed)
Musculoskeletal Exam  Patient: Phillip Richard DOB: November 10, 1950  DOS: 06/29/2019  SUBJECTIVE:  Chief Complaint:   Chief Complaint  Patient presents with  . Knee Pain    right  . Hip Pain    right    Phillip Richard is a 69 y.o.  male for evaluation and treatment of R knee/hip pain.   Onset:  1 year ago. [place] 1 year ago.  Location: R inner knee Character:  aching and sharp  Progression of issue:  has worsened Associated symptoms: Pain w walking Treatment: to date has been rest, ice and PT.   Neurovascular symptoms: no  Past Medical History:  Diagnosis Date  . Allergic rhinitis   . Allergy   . Arthritis    shoulder  . Cataract    both eyes - just watching now  . Diabetes mellitus without complication ([place])   . History of chicken pox   . History of lead poisoning   . Hyperlipidemia   . Mild cognitive impairment   . Mumps   . Uses walker    ambulates with walker    Objective: VITAL SIGNS: BP 128/80 (BP Location: Left Arm, Patient Position: Sitting, Cuff Size: Normal)   Pulse (!) 117   Temp (!) 95.1 F (35.1 C) (Temporal)   Ht 5\' 10"  (1.778 m)   Wt 192 lb 2 oz (87.1 kg)   SpO2 97%   BMI 27.57 kg/m  Constitutional: Well formed, well developed. No acute distress. Thorax & Lungs: No accessory muscle use Musculoskeletal: R knee.   Normal active range of motion: yes.   Normal passive range of motion: yes Tenderness to palpation: yes- over medial plica of femur Deformity: no Ecchymosis: no Tests positive: none Tests negative: Stine's, Lachman's, varus/valgus stress Neurologic: Normal sensory function. No focal deficits noted.  Psychiatric: Normal mood.      Procedure Note; Knee plica injection Verbal consent obtained. The area of interest was palpated and marked with an otoscope speculum, cleaned w alcohol and sprayed with freeze spray A 27-gauge needle was used to introduce 20 mg of Depomedrol with 1 mL of 1% lidocaine was injected. A bandaid was placed.   The patient tolerated the procedure well. There were no complications noted.  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 Injection today, ice. If no improvement in next 2-3 weeks, will refer to ortho.  F/u as originally scheduled otherwise. . The patient voiced understanding and agreement to the plan.   [place], DO 06/29/19  11:02 AM

## 2019-07-03 DIAGNOSIS — G3184 Mild cognitive impairment, so stated: Secondary | ICD-10-CM | POA: Diagnosis not present

## 2019-07-03 DIAGNOSIS — Z7984 Long term (current) use of oral hypoglycemic drugs: Secondary | ICD-10-CM | POA: Diagnosis not present

## 2019-07-03 DIAGNOSIS — E1165 Type 2 diabetes mellitus with hyperglycemia: Secondary | ICD-10-CM | POA: Diagnosis not present

## 2019-07-03 DIAGNOSIS — E785 Hyperlipidemia, unspecified: Secondary | ICD-10-CM | POA: Diagnosis not present

## 2019-07-03 DIAGNOSIS — R5381 Other malaise: Secondary | ICD-10-CM | POA: Diagnosis not present

## 2019-08-01 ENCOUNTER — Ambulatory Visit: Payer: Medicare Other | Admitting: Family Medicine

## 2019-08-01 ENCOUNTER — Encounter: Payer: Self-pay | Admitting: Family Medicine

## 2019-08-01 ENCOUNTER — Ambulatory Visit (INDEPENDENT_AMBULATORY_CARE_PROVIDER_SITE_OTHER): Payer: Medicare Other | Admitting: Family Medicine

## 2019-08-01 ENCOUNTER — Other Ambulatory Visit: Payer: Self-pay

## 2019-08-01 VITALS — BP 138/82 | HR 72 | Temp 97.0°F | Ht 70.0 in | Wt 200.2 lb

## 2019-08-01 DIAGNOSIS — M25511 Pain in right shoulder: Secondary | ICD-10-CM | POA: Diagnosis not present

## 2019-08-01 MED ORDER — METHYLPREDNISOLONE ACETATE 40 MG/ML IJ SUSP
40.0000 mg | Freq: Once | INTRAMUSCULAR | Status: AC
  Administered 2019-08-01: 40 mg via INTRA_ARTICULAR

## 2019-08-01 NOTE — Addendum Note (Signed)
Addended by: Sharon Seller B on: 08/01/2019 01:41 PM   Modules accepted: Orders

## 2019-08-01 NOTE — Progress Notes (Signed)
Musculoskeletal Exam  Patient: Phillip Richard DOB: 04/04/1950  DOS: 08/01/2019  SUBJECTIVE:  Chief Complaint:   Chief Complaint  Patient presents with  . Shoulder Pain    right    Phillip Richard is a 69 y.o.  male for evaluation and treatment of R shoulder pain.   Onset:  6 weeks ago. Pulled shoulder up from recliner and felt pain.  Location: top portion of R shoulder  Character:  sharp  Progression of issue:  is unchanged Associated symptoms: no bruising, swelling, redness; slightly decreased ROM Treatment: to date has been tramadol.   Neurovascular symptoms: no  Past Medical History:  Diagnosis Date  . Allergic rhinitis   . Allergy   . Arthritis    shoulder  . Cataract    both eyes - just watching now  . Diabetes mellitus without complication ([place])   . History of chicken pox   . History of lead poisoning   . Hyperlipidemia   . Mild cognitive impairment   . Mumps   . Uses walker    ambulates with walker    Objective: VITAL SIGNS: BP 138/82 (BP Location: Left Arm, Patient Position: Sitting, Cuff Size: Normal)   Pulse 72   Temp (!) 97 F (36.1 C) (Temporal)   Ht 5\' 10"  (1.778 m)   Wt 200 lb 4 oz (90.8 kg)   SpO2 97%   BMI 28.73 kg/m  Constitutional: Well formed, well developed. No acute distress. Cardiovascular: Brisk cap refill Thorax & Lungs: No accessory muscle use Musculoskeletal: R shoulder.   Normal active range of motion: no.   Normal passive range of motion: Yes Tenderness to palpation: mild ttp over R trap Deformity: no Ecchymosis: no Tests positive: [place], empty can Tests negative: Speed's, cross over, lift off, Neer's Neurologic: No cerebellar signs Psychiatric: Normal mood.   Procedure Note; Shoulder bursa injection Verbal consent obtained. The area was palpated, an area was marked just caudal to the acromion process laterally, and cleaned with Betadine x1. A 27-gauge needle was used to enter the joint laterally with ease. 40 mg of  Depomedrol with 2 mL of 1% lidocaine was injected. The patient tolerated the procedure well. There were no complications noted.  Assessment:  Right shoulder pain, unspecified chronicity - Plan: PR DRAIN/INJECT LARGE JOINT/BURSA  Plan:  Orders as above. Tylenol, ice, heat, stretches/exercises. F/u prn. The patient voiced understanding and agreement to the plan.   Phillip Wheeler, DO 08/01/19  1:32 PM

## 2019-08-01 NOTE — Patient Instructions (Signed)
Heat (pad or rice pillow in microwave) over affected area, 10-15 minutes twice daily.   Ice/cold pack over area for 10-15 min twice daily.  OK to take Tylenol 1000 mg (2 extra strength tabs) or 975 mg (3 regular strength tabs) every 6 hours as needed.  EXERCISES  RANGE OF MOTION (ROM) AND STRETCHING EXERCISES These exercises may help you when beginning to rehabilitate your injury. While completing these exercises, remember:   Restoring tissue flexibility helps normal motion to return to the joints. This allows healthier, less painful movement and activity.  An effective stretch should be held for at least 30 seconds.  A stretch should never be painful. You should only feel a gentle lengthening or release in the stretched tissue.  ROM - Pendulum  Bend at the waist so that your right / left arm falls away from your body. Support yourself with your opposite hand on a solid surface, such as a table or a countertop.  Your right / left arm should be perpendicular to the ground. If it is not perpendicular, you need to lean over farther. Relax the muscles in your right / left arm and shoulder as much as possible.  Gently sway your hips and trunk so they move your right / left arm without any use of your right / left shoulder muscles.  Progress your movements so that your right / left arm moves side to side, then forward and backward, and finally, both clockwise and counterclockwise.  Complete 10-15 repetitions in each direction. Many people use this exercise to relieve discomfort in their shoulder as well as to gain range of motion. Repeat 2 times. Complete this exercise 3 times per week.  STRETCH - Flexion, Standing  Stand with good posture. With an underhand grip on your right / left hand and an overhand grip on the opposite hand, grasp a broomstick or cane so that your hands are a little more than shoulder-width apart.  Keeping your right / left elbow straight and shoulder muscles  relaxed, push the stick with your opposite hand to raise your right / left arm in front of your body and then overhead. Raise your arm until you feel a stretch in your right / left shoulder, but before you have increased shoulder pain.  Try to avoid shrugging your right / left shoulder as your arm rises by keeping your shoulder blade tucked down and toward your mid-back spine. Hold 30 seconds.  Slowly return to the starting position. Repeat 2 times. Complete this exercise 3 times per week.  STRETCH - Internal Rotation  Place your right / left hand behind your back, palm-up.  Throw a towel or belt over your opposite shoulder. Grasp the towel/belt with your right / left hand.  While keeping an upright posture, gently pull up on the towel/belt until you feel a stretch in the front of your right / left shoulder.  Avoid shrugging your right / left shoulder as your arm rises by keeping your shoulder blade tucked down and toward your mid-back spine.  Hold 30. Release the stretch by lowering your opposite hand. Repeat 2 times. Complete this exercise 3 times per week.  STRETCH - External Rotation and Abduction  Stagger your stance through a doorframe. It does not matter which foot is forward.  As instructed by your physician, physical therapist or athletic trainer, place your hands: ? And forearms above your head and on the door frame. ? And forearms at head-height and on the door frame. ? At   elbow-height and on the door frame.  Keeping your head and chest upright and your stomach muscles tight to prevent over-extending your low-back, slowly shift your weight onto your front foot until you feel a stretch across your chest and/or in the front of your shoulders.  Hold 30 seconds. Shift your weight to your back foot to release the stretch. Repeat 2 times. Complete this stretch 3 times per week.   STRENGTHENING EXERCISES  These exercises may help you when beginning to rehabilitate your injury.  They may resolve your symptoms with or without further involvement from your physician, physical therapist or athletic trainer. While completing these exercises, remember:   Muscles can gain both the endurance and the strength needed for everyday activities through controlled exercises.  Complete these exercises as instructed by your physician, physical therapist or athletic trainer. Progress the resistance and repetitions only as guided.  You may experience muscle soreness or fatigue, but the pain or discomfort you are trying to eliminate should never worsen during these exercises. If this pain does worsen, stop and make certain you are following the directions exactly. If the pain is still present after adjustments, discontinue the exercise until you can discuss the trouble with your clinician.  If advised by your physician, during your recovery, avoid activity or exercises which involve actions that place your right / left hand or elbow above your head or behind your back or head. These positions stress the tissues which are trying to heal.  STRENGTH - Scapular Depression and Adduction  With good posture, sit on a firm chair. Supported your arms in front of you with pillows, arm rests or a table top. Have your elbows in line with the sides of your body.  Gently draw your shoulder blades down and toward your mid-back spine. Gradually increase the tension without tensing the muscles along the top of your shoulders and the back of your neck.  Hold for 3 seconds. Slowly release the tension and relax your muscles completely before completing the next repetition.  After you have practiced this exercise, remove the arm support and complete it in standing as well as sitting. Repeat 2 times. Complete this exercise 3 times per week.   STRENGTH - External Rotators  Secure a rubber exercise band/tubing to a fixed object so that it is at the same height as your right / left elbow when you are standing  or sitting on a firm surface.  Stand or sit so that the secured exercise band/tubing is at your side that is not injured.  Bend your elbow 90 degrees. Place a folded towel or small pillow under your right / left arm so that your elbow is a few inches away from your side.  Keeping the tension on the exercise band/tubing, pull it away from your body, as if pivoting on your elbow. Be sure to keep your body steady so that the movement is only coming from your shoulder rotating.  Hold 3 seconds. Release the tension in a controlled manner as you return to the starting position. Repeat 2 times. Complete this exercise 3 times per week.   STRENGTH - Supraspinatus  Stand or sit with good posture. Grasp a 2-3 lb weight or an exercise band/tubing so that your hand is "thumbs-up," like when you shake hands.  Slowly lift your right / left hand from your thigh into the air, traveling about 30 degrees from straight out at your side. Lift your hand to shoulder height or as far as you   can without increasing any shoulder pain. Initially, many people do not lift their hands above shoulder height.  Avoid shrugging your right / left shoulder as your arm rises by keeping your shoulder blade tucked down and toward your mid-back spine.  Hold for 3 seconds. Control the descent of your hand as you slowly return to your starting position. Repeat 2 times. Complete this exercise 3 times per week.   STRENGTH - Shoulder Extensors  Secure a rubber exercise band/tubing so that it is at the height of your shoulders when you are either standing or sitting on a firm arm-less chair.  With a thumbs-up grip, grasp an end of the band/tubing in each hand. Straighten your elbows and lift your hands straight in front of you at shoulder height. Step back away from the secured end of band/tubing until it becomes tense.  Squeezing your shoulder blades together, pull your hands down to the sides of your thighs. Do not allow your hands  to go behind you.  Hold for 3 seconds. Slowly ease the tension on the band/tubing as you reverse the directions and return to the starting position. Repeat 2 times. Complete this exercise 3 times per week.   STRENGTH - Scapular Retractors  Secure a rubber exercise band/tubing so that it is at the height of your shoulders when you are either standing or sitting on a firm arm-less chair.  With a palm-down grip, grasp an end of the band/tubing in each hand. Straighten your elbows and lift your hands straight in front of you at shoulder height. Step back away from the secured end of band/tubing until it becomes tense.  Squeezing your shoulder blades together, draw your elbows back as you bend them. Keep your upper arm lifted away from your body throughout the exercise.  Hold 3 seconds. Slowly ease the tension on the band/tubing as you reverse the directions and return to the starting position. Repeat 2 times. Complete this exercise 3 times per week.  STRENGTH - Scapular Depressors  Find a sturdy chair without wheels, such as a from a dining room table.  Keeping your feet on the floor, lift your bottom from the seat and lock your elbows.  Keeping your elbows straight, allow gravity to pull your body weight down. Your shoulders will rise toward your ears.  Raise your body against gravity by drawing your shoulder blades down your back, shortening the distance between your shoulders and ears. Although your feet should always maintain contact with the floor, your feet should progressively support less body weight as you get stronger.  Hold 3 seconds. In a controlled and slow manner, lower your body weight to begin the next repetition. Repeat 2 times. Complete this exercise 3 times per week.   This information is not intended to replace advice given to you by your health care provider. Make sure you discuss any questions you have with your health care provider.  Document Released: 12/30/2004  Document Revised: 03/08/2014 Document Reviewed: 05/30/2008 [employer] Interactive Patient Education 2016 [employer]  

## 2019-08-06 ENCOUNTER — Ambulatory Visit: Payer: Medicare Other | Admitting: Family Medicine

## 2019-09-07 ENCOUNTER — Other Ambulatory Visit: Payer: Self-pay | Admitting: Family Medicine

## 2019-09-07 DIAGNOSIS — Z794 Long term (current) use of insulin: Secondary | ICD-10-CM

## 2019-10-12 ENCOUNTER — Ambulatory Visit: Payer: Medicare Other | Admitting: Family Medicine

## 2019-10-16 ENCOUNTER — Ambulatory Visit (INDEPENDENT_AMBULATORY_CARE_PROVIDER_SITE_OTHER): Payer: Medicare Other | Admitting: Family Medicine

## 2019-10-16 ENCOUNTER — Other Ambulatory Visit: Payer: Self-pay | Admitting: Family Medicine

## 2019-10-16 ENCOUNTER — Other Ambulatory Visit: Payer: Self-pay

## 2019-10-16 ENCOUNTER — Encounter: Payer: Self-pay | Admitting: Family Medicine

## 2019-10-16 VITALS — BP 120/78 | HR 103 | Temp 98.1°F | Ht 71.0 in | Wt 198.5 lb

## 2019-10-16 DIAGNOSIS — B353 Tinea pedis: Secondary | ICD-10-CM

## 2019-10-16 DIAGNOSIS — M7918 Myalgia, other site: Secondary | ICD-10-CM | POA: Diagnosis not present

## 2019-10-16 DIAGNOSIS — M7501 Adhesive capsulitis of right shoulder: Secondary | ICD-10-CM | POA: Diagnosis not present

## 2019-10-16 MED ORDER — KETOCONAZOLE 2 % EX CREA: 1.0000 "application " | TOPICAL_CREAM | Freq: Every day | CUTANEOUS | 0 refills | Status: DC

## 2019-10-16 NOTE — Patient Instructions (Addendum)
I recommend getting the flu shot in mid October. This suggestion would change if the CDC comes out with a different recommendation.   Keep the diet clean and stay active.  If you do not hear anything about your physical therapy referral in the next 1-2 weeks, call our office and ask for an update.  The first and second toe on the R foot and between 2nd and 3rd toe; 3rd and 4th toe on the L for athlete's foot. Put the cream in these areas for 6 weeks.  Let us know if you need anything.  Mid-Back Strain Rehab It is normal to feel mild stretching, pulling, tightness, or discomfort as you do these exercises, but you should stop right away if you feel sudden pain or your pain gets worse.  Stretching and range of motion exercises This exercise warms up your muscles and joints and improves the movement and flexibility of your back and shoulders. This exercise also help to relieve pain. Exercise A: Chest and spine stretch  1. Lie down on your back on a firm surface. 2. Roll a towel or a small blanket so it is about 4 inches (10 cm) in diameter. 3. Put the towel lengthwise under the middle of your back so it is under your spine, but not under your shoulder blades. 4. To increase the stretch, you may put your hands behind your head and let your elbows fall to your sides. 5. Hold for 30 seconds. Repeat exercise 2 times. Complete this exercise 3 times per week. Strengthening exercises These exercises build strength and endurance in your back and your shoulder blade muscles. Endurance is the ability to use your muscles for a long time, even after they get tired. Exercise C: Straight arm rows (shoulder extension) 1. Stand with your feet shoulder width apart. 2. Secure an exercise band to a stable object in front of you so the band is at or above shoulder height. 3. Hold one end of the exercise band in each hand. 4. Straighten your elbows and lift your hands up to shoulder height. 5. Step back, away  from the secured end of the exercise band, until the band stretches. 6. Squeeze your shoulder blades together and pull your hands down to the sides of your thighs. Stop when your hands are straight down by your sides. Do not let your hands go behind your body. 7. Hold for 2 seconds. 8. Slowly return to the starting position. Repeat 2 times. Complete this exercise 3 times per week. Exercise D: Shoulder external rotation, prone 1. Lie on your abdomen on a firm bed so your left / right forearm hangs over the edge of the bed and your upper arm is on the bed, straight out from your body. ? Your elbow should be bent. ? Your palm should be facing your feet. 2. If instructed, hold a 2-5 lb weight in your hand. 3. Squeeze your shoulder blade toward the middle of your back. Do not let your shoulder lift toward your ear. 4. Keep your elbow bent in an "L" shape (90 degrees) while you slowly move your forearm up toward the ceiling. Move your forearm up to the height of the bed, toward your head. ? Your upper arm should not move. ? At the top of the movement, your palm should face the floor. 5. Hold for 1 second. 6. Slowly return to the starting position and relax your muscles. Repeat 2 times. Complete this exercise 3 times per week. Exercise E: Scapular  retraction and external rotation, rowing  1. Sit in a stable chair without armrests, or stand. 2. Secure an exercise band to a stable object in front of you so it is at shoulder height. 3. Hold one end of the exercise band in each hand. 4. Bring your arms out straight in front of you. 5. Step back, away from the secured end of the exercise band, until the band stretches. 6. Pull the band backward. As you do this, bend your elbows and squeeze your shoulder blades together, but avoid letting the rest of your body move. Do not let your shoulders lift up toward your ears. 7. Stop when your elbows are at your sides or slightly behind your body. 8. Hold for 1  second1. 9. Slowly straighten your arms to return to the starting position. Repeat 2 times. Complete this exercise 3 times per week. Posture and body mechanics  Body mechanics refers to the movements and positions of your body while you do your daily activities. Posture is part of body mechanics. Good posture and healthy body mechanics can help to relieve stress in your body's tissues and joints. Good posture means that your spine is in its natural S-curve position (your spine is neutral), your shoulders are pulled back slightly, and your head is not tipped forward. The following are general guidelines for applying improved posture and body mechanics to your everyday activities. Standing   When standing, keep your spine neutral and your feet about hip-width apart. Keep a slight bend in your knees. Your ears, shoulders, and hips should line up.  When you do a task in which you lean forward while standing in one place for a long time, place one foot up on a stable object that is 2-4 inches (5-10 cm) high, such as a footstool. This helps keep your spine neutral. Sitting   When sitting, keep your spine neutral and keep your feet flat on the floor. Use a footrest, if necessary, and keep your thighs parallel to the floor. Avoid rounding your shoulders, and avoid tilting your head forward.  When working at a desk or a computer, keep your desk at a height where your hands are slightly lower than your elbows. Slide your chair under your desk so you are close enough to maintain good posture.  When working at a computer, place your monitor at a height where you are looking straight ahead and you do not have to tilt your head forward or downward to look at the screen. Resting  When lying down and resting, avoid positions that are most painful for you.  If you have pain with activities such as sitting, bending, stooping, or squatting (flexion-based activities), lie in a position in which your body does  not bend very much. For example, avoid curling up on your side with your arms and knees near your chest (fetal position).  If you have pain with activities such as standing for a long time or reaching with your arms (extension-based activities), lie with your spine in a neutral position and bend your knees slightly. Try the following positions:  Lying on your side with a pillow between your knees.  Lying on your back with a pillow under your knees.  Lifting   When lifting objects, keep your feet at least shoulder-width apart and tighten your abdominal muscles.  Bend your knees and hips and keep your spine neutral. It is important to lift using the strength of your legs, not your back. Do not lock your  knees straight out.  Always ask for help to lift heavy or awkward objects. Make sure you discuss any questions you have with your health care provider. Document Released: 02/15/2005 Document Revised: 10/23/2015 Document Reviewed: 11/27/2014 Elsevier Interactive Patient Education  [employer].

## 2019-10-16 NOTE — Progress Notes (Signed)
Musculoskeletal Exam  Patient: Phillip Richard DOB: Aug 25, 1950  DOS: 10/16/2019  SUBJECTIVE:  Chief Complaint:   Chief Complaint  Patient presents with   Shoulder Pain    both    Phillip Richard is a 69 y.o.  male for evaluation and treatment of L shoulder pain.   Onset:  2 weeks ago. No inj or change in activity.  Location: back of left shoulder region Character:  aching  Progression of issue:  is unchanged Associated symptoms: no swelling, redness, bruising Treatment: to date has been rest.   Neurovascular symptoms: no  The right shoulder was evaluated around 2 months ago.  Injection helped a little bit but he is still having decreased range of motion and pain.  He reports compliance with home stretches and exercises.  Past Medical History:  Diagnosis Date   Allergic rhinitis    Allergy    Arthritis    shoulder   Cataract    both eyes - just watching now   Diabetes mellitus without complication (HCC)    History of chicken pox    History of lead poisoning    Hyperlipidemia    Mild cognitive impairment    Mumps    Uses walker    ambulates with walker    Objective: VITAL SIGNS: BP 120/78 (BP Location: Left Arm, Patient Position: Sitting, Cuff Size: Normal)    Pulse (!) 103    Temp 98.1 F (36.7 C) (Oral)    Ht 5\' 11"  (1.803 m)    Wt 198 lb 8 oz (90 kg)    SpO2 96%    BMI 27.69 kg/m  Constitutional: Well formed, well developed. No acute distress. Thorax & Lungs: No accessory muscle use Musculoskeletal: bilateral shoulders.   Normal active range of motion: decreased on R; WNL.   Normal passive range of motion: yes Tenderness to palpation: no Deformity: no Ecchymosis: no Tests positive: none Tests negative: Hawkins, empty can, liftoff, speeds, crossover, Neer's Neurologic: Normal sensory function. No focal deficits noted. DTR's equal and symmetric in UE's. No clonus. Psychiatric: Normal mood. Age appropriate judgment and insight. Alert & oriented x  3.    Assessment:  Adhesive capsulitis of right shoulder - Plan: Ambulatory referral to Physical Therapy  Tinea pedis of both feet - Plan: ketoconazole (NIZORAL) 2 % cream  Rhomboid muscle pain  Plan: 1.  Physical therapy.  Continue home stretches exercise in the meantime. 2.  Ketoconazole cream for 6 weeks. 3.  Stretches and exercises.  PT if no improvement. The patient voiced understanding and agreement to the plan.   [place], DO 10/16/19  4:23 PM

## 2019-10-25 DIAGNOSIS — M19019 Primary osteoarthritis, unspecified shoulder: Secondary | ICD-10-CM | POA: Diagnosis not present

## 2019-10-25 DIAGNOSIS — M7501 Adhesive capsulitis of right shoulder: Secondary | ICD-10-CM | POA: Diagnosis not present

## 2019-10-25 DIAGNOSIS — H269 Unspecified cataract: Secondary | ICD-10-CM | POA: Diagnosis not present

## 2019-10-25 DIAGNOSIS — G3184 Mild cognitive impairment, so stated: Secondary | ICD-10-CM | POA: Diagnosis not present

## 2019-10-25 DIAGNOSIS — Z794 Long term (current) use of insulin: Secondary | ICD-10-CM | POA: Diagnosis not present

## 2019-10-25 DIAGNOSIS — E119 Type 2 diabetes mellitus without complications: Secondary | ICD-10-CM | POA: Diagnosis not present

## 2019-10-25 DIAGNOSIS — E785 Hyperlipidemia, unspecified: Secondary | ICD-10-CM | POA: Diagnosis not present

## 2019-10-29 DIAGNOSIS — M19019 Primary osteoarthritis, unspecified shoulder: Secondary | ICD-10-CM | POA: Diagnosis not present

## 2019-10-29 DIAGNOSIS — E785 Hyperlipidemia, unspecified: Secondary | ICD-10-CM | POA: Diagnosis not present

## 2019-10-29 DIAGNOSIS — M7501 Adhesive capsulitis of right shoulder: Secondary | ICD-10-CM | POA: Diagnosis not present

## 2019-10-29 DIAGNOSIS — G3184 Mild cognitive impairment, so stated: Secondary | ICD-10-CM | POA: Diagnosis not present

## 2019-10-29 DIAGNOSIS — H269 Unspecified cataract: Secondary | ICD-10-CM | POA: Diagnosis not present

## 2019-10-29 DIAGNOSIS — E119 Type 2 diabetes mellitus without complications: Secondary | ICD-10-CM | POA: Diagnosis not present

## 2019-11-01 DIAGNOSIS — G3184 Mild cognitive impairment, so stated: Secondary | ICD-10-CM | POA: Diagnosis not present

## 2019-11-01 DIAGNOSIS — E785 Hyperlipidemia, unspecified: Secondary | ICD-10-CM | POA: Diagnosis not present

## 2019-11-01 DIAGNOSIS — E119 Type 2 diabetes mellitus without complications: Secondary | ICD-10-CM | POA: Diagnosis not present

## 2019-11-01 DIAGNOSIS — H269 Unspecified cataract: Secondary | ICD-10-CM | POA: Diagnosis not present

## 2019-11-01 DIAGNOSIS — M19019 Primary osteoarthritis, unspecified shoulder: Secondary | ICD-10-CM | POA: Diagnosis not present

## 2019-11-01 DIAGNOSIS — M7501 Adhesive capsulitis of right shoulder: Secondary | ICD-10-CM | POA: Diagnosis not present

## 2019-11-02 ENCOUNTER — Ambulatory Visit: Payer: Medicare Other | Admitting: Family Medicine

## 2019-11-06 DIAGNOSIS — E119 Type 2 diabetes mellitus without complications: Secondary | ICD-10-CM | POA: Diagnosis not present

## 2019-11-06 DIAGNOSIS — M19019 Primary osteoarthritis, unspecified shoulder: Secondary | ICD-10-CM | POA: Diagnosis not present

## 2019-11-06 DIAGNOSIS — G3184 Mild cognitive impairment, so stated: Secondary | ICD-10-CM | POA: Diagnosis not present

## 2019-11-06 DIAGNOSIS — E785 Hyperlipidemia, unspecified: Secondary | ICD-10-CM | POA: Diagnosis not present

## 2019-11-06 DIAGNOSIS — H269 Unspecified cataract: Secondary | ICD-10-CM | POA: Diagnosis not present

## 2019-11-06 DIAGNOSIS — M7501 Adhesive capsulitis of right shoulder: Secondary | ICD-10-CM | POA: Diagnosis not present

## 2019-11-08 ENCOUNTER — Telehealth: Payer: Self-pay

## 2019-11-08 DIAGNOSIS — E119 Type 2 diabetes mellitus without complications: Secondary | ICD-10-CM | POA: Diagnosis not present

## 2019-11-08 DIAGNOSIS — E785 Hyperlipidemia, unspecified: Secondary | ICD-10-CM | POA: Diagnosis not present

## 2019-11-08 DIAGNOSIS — G3184 Mild cognitive impairment, so stated: Secondary | ICD-10-CM | POA: Diagnosis not present

## 2019-11-08 DIAGNOSIS — M19019 Primary osteoarthritis, unspecified shoulder: Secondary | ICD-10-CM | POA: Diagnosis not present

## 2019-11-08 DIAGNOSIS — H269 Unspecified cataract: Secondary | ICD-10-CM | POA: Diagnosis not present

## 2019-11-08 DIAGNOSIS — M7501 Adhesive capsulitis of right shoulder: Secondary | ICD-10-CM | POA: Diagnosis not present

## 2019-11-08 NOTE — Telephone Encounter (Signed)
[name],pt's sister, dropped off FL2 form for completion.  Please see her Mychart msgs to provider regarding it as well.  She stated she needs it 11/12/19.  Form placed in provider's box for pick up and completion.  Please call Lorriane Shire and she will pick up completed form [phone] (h) or [phone] (w).

## 2019-11-09 NOTE — Telephone Encounter (Signed)
PCP completed FL2 Called the sister to inform ready for pickup at the front desk. Made a copy for scan

## 2019-11-12 DIAGNOSIS — H269 Unspecified cataract: Secondary | ICD-10-CM | POA: Diagnosis not present

## 2019-11-12 DIAGNOSIS — M7501 Adhesive capsulitis of right shoulder: Secondary | ICD-10-CM | POA: Diagnosis not present

## 2019-11-12 DIAGNOSIS — E785 Hyperlipidemia, unspecified: Secondary | ICD-10-CM | POA: Diagnosis not present

## 2019-11-12 DIAGNOSIS — M19019 Primary osteoarthritis, unspecified shoulder: Secondary | ICD-10-CM | POA: Diagnosis not present

## 2019-11-12 DIAGNOSIS — G3184 Mild cognitive impairment, so stated: Secondary | ICD-10-CM | POA: Diagnosis not present

## 2019-11-12 DIAGNOSIS — E119 Type 2 diabetes mellitus without complications: Secondary | ICD-10-CM | POA: Diagnosis not present

## 2019-11-14 DIAGNOSIS — G3184 Mild cognitive impairment, so stated: Secondary | ICD-10-CM | POA: Diagnosis not present

## 2019-11-14 DIAGNOSIS — E119 Type 2 diabetes mellitus without complications: Secondary | ICD-10-CM | POA: Diagnosis not present

## 2019-11-14 DIAGNOSIS — E785 Hyperlipidemia, unspecified: Secondary | ICD-10-CM | POA: Diagnosis not present

## 2019-11-14 DIAGNOSIS — M19019 Primary osteoarthritis, unspecified shoulder: Secondary | ICD-10-CM | POA: Diagnosis not present

## 2019-11-14 DIAGNOSIS — H269 Unspecified cataract: Secondary | ICD-10-CM | POA: Diagnosis not present

## 2019-11-14 DIAGNOSIS — M7501 Adhesive capsulitis of right shoulder: Secondary | ICD-10-CM | POA: Diagnosis not present

## 2019-11-19 DIAGNOSIS — M19019 Primary osteoarthritis, unspecified shoulder: Secondary | ICD-10-CM | POA: Diagnosis not present

## 2019-11-19 DIAGNOSIS — G3184 Mild cognitive impairment, so stated: Secondary | ICD-10-CM | POA: Diagnosis not present

## 2019-11-19 DIAGNOSIS — H269 Unspecified cataract: Secondary | ICD-10-CM | POA: Diagnosis not present

## 2019-11-19 DIAGNOSIS — E119 Type 2 diabetes mellitus without complications: Secondary | ICD-10-CM | POA: Diagnosis not present

## 2019-11-19 DIAGNOSIS — M7501 Adhesive capsulitis of right shoulder: Secondary | ICD-10-CM | POA: Diagnosis not present

## 2019-11-19 DIAGNOSIS — E785 Hyperlipidemia, unspecified: Secondary | ICD-10-CM | POA: Diagnosis not present

## 2019-11-21 DIAGNOSIS — M7501 Adhesive capsulitis of right shoulder: Secondary | ICD-10-CM | POA: Diagnosis not present

## 2019-11-21 DIAGNOSIS — M19019 Primary osteoarthritis, unspecified shoulder: Secondary | ICD-10-CM | POA: Diagnosis not present

## 2019-11-21 DIAGNOSIS — H269 Unspecified cataract: Secondary | ICD-10-CM | POA: Diagnosis not present

## 2019-11-21 DIAGNOSIS — E119 Type 2 diabetes mellitus without complications: Secondary | ICD-10-CM | POA: Diagnosis not present

## 2019-11-21 DIAGNOSIS — E785 Hyperlipidemia, unspecified: Secondary | ICD-10-CM | POA: Diagnosis not present

## 2019-11-21 DIAGNOSIS — G3184 Mild cognitive impairment, so stated: Secondary | ICD-10-CM | POA: Diagnosis not present

## 2019-11-24 DIAGNOSIS — Z794 Long term (current) use of insulin: Secondary | ICD-10-CM | POA: Diagnosis not present

## 2019-11-24 DIAGNOSIS — E119 Type 2 diabetes mellitus without complications: Secondary | ICD-10-CM | POA: Diagnosis not present

## 2019-11-24 DIAGNOSIS — G3184 Mild cognitive impairment, so stated: Secondary | ICD-10-CM | POA: Diagnosis not present

## 2019-11-24 DIAGNOSIS — H269 Unspecified cataract: Secondary | ICD-10-CM | POA: Diagnosis not present

## 2019-11-24 DIAGNOSIS — M19019 Primary osteoarthritis, unspecified shoulder: Secondary | ICD-10-CM | POA: Diagnosis not present

## 2019-11-24 DIAGNOSIS — E785 Hyperlipidemia, unspecified: Secondary | ICD-10-CM | POA: Diagnosis not present

## 2019-11-24 DIAGNOSIS — M7501 Adhesive capsulitis of right shoulder: Secondary | ICD-10-CM | POA: Diagnosis not present

## 2019-11-26 ENCOUNTER — Telehealth: Payer: Self-pay | Admitting: Family Medicine

## 2019-11-26 DIAGNOSIS — E119 Type 2 diabetes mellitus without complications: Secondary | ICD-10-CM | POA: Diagnosis not present

## 2019-11-26 DIAGNOSIS — M19019 Primary osteoarthritis, unspecified shoulder: Secondary | ICD-10-CM | POA: Diagnosis not present

## 2019-11-26 DIAGNOSIS — H269 Unspecified cataract: Secondary | ICD-10-CM | POA: Diagnosis not present

## 2019-11-26 DIAGNOSIS — E785 Hyperlipidemia, unspecified: Secondary | ICD-10-CM | POA: Diagnosis not present

## 2019-11-26 DIAGNOSIS — G3184 Mild cognitive impairment, so stated: Secondary | ICD-10-CM | POA: Diagnosis not present

## 2019-11-26 DIAGNOSIS — M7501 Adhesive capsulitis of right shoulder: Secondary | ICD-10-CM | POA: Diagnosis not present

## 2019-11-26 NOTE — Telephone Encounter (Signed)
Caller : Shawn -PT  Call Back # [phone]  Per Shawn , patient pain has increased 7/10 when moving arm certain ways during PT. Per Raquel Sarna it is company policy to alert provider when patient has increased. Per Shawn, Pt pain has decreased today to 2/10.

## 2019-11-26 NOTE — Telephone Encounter (Signed)
Called left message to call back 

## 2019-11-26 NOTE — Telephone Encounter (Signed)
If pain is consistently bothersome, I want to see him in the office. Ty.

## 2019-11-27 NOTE — Telephone Encounter (Signed)
Called and informed the PT of PCP response. He stated the pain for more positional. Yesterday his pain was at a 2 . PT will keep [place] informed if worsens

## 2019-11-28 ENCOUNTER — Telehealth: Payer: Self-pay | Admitting: Family Medicine

## 2019-11-28 DIAGNOSIS — M7501 Adhesive capsulitis of right shoulder: Secondary | ICD-10-CM | POA: Diagnosis not present

## 2019-11-28 DIAGNOSIS — G3184 Mild cognitive impairment, so stated: Secondary | ICD-10-CM | POA: Diagnosis not present

## 2019-11-28 DIAGNOSIS — M19019 Primary osteoarthritis, unspecified shoulder: Secondary | ICD-10-CM | POA: Diagnosis not present

## 2019-11-28 DIAGNOSIS — H269 Unspecified cataract: Secondary | ICD-10-CM | POA: Diagnosis not present

## 2019-11-28 DIAGNOSIS — E785 Hyperlipidemia, unspecified: Secondary | ICD-10-CM | POA: Diagnosis not present

## 2019-11-28 DIAGNOSIS — E119 Type 2 diabetes mellitus without complications: Secondary | ICD-10-CM | POA: Diagnosis not present

## 2019-11-28 NOTE — Telephone Encounter (Signed)
OK 

## 2019-11-28 NOTE — Telephone Encounter (Signed)
Called informed of PCP ok to discontinue

## 2019-11-28 NOTE — Telephone Encounter (Signed)
Caller: Shanon Payor  Call Back # [phone]  Per Caryl Pina w/[facility]  , Nanine Means is proving Physical Therapy to patient, per order Nanine Means was asked to  monitor patient's blood glucose, per Caryl Pina the assisted livng is monitoring blood glucose, [facility] PT would like to discontinue that order to monitor glucose .  Please Advise

## 2019-12-02 ENCOUNTER — Other Ambulatory Visit: Payer: Self-pay | Admitting: Family Medicine

## 2019-12-02 DIAGNOSIS — E119 Type 2 diabetes mellitus without complications: Secondary | ICD-10-CM

## 2019-12-03 DIAGNOSIS — E119 Type 2 diabetes mellitus without complications: Secondary | ICD-10-CM | POA: Diagnosis not present

## 2019-12-03 DIAGNOSIS — M19019 Primary osteoarthritis, unspecified shoulder: Secondary | ICD-10-CM | POA: Diagnosis not present

## 2019-12-03 DIAGNOSIS — G3184 Mild cognitive impairment, so stated: Secondary | ICD-10-CM | POA: Diagnosis not present

## 2019-12-03 DIAGNOSIS — E785 Hyperlipidemia, unspecified: Secondary | ICD-10-CM | POA: Diagnosis not present

## 2019-12-03 DIAGNOSIS — M7501 Adhesive capsulitis of right shoulder: Secondary | ICD-10-CM | POA: Diagnosis not present

## 2019-12-03 DIAGNOSIS — H269 Unspecified cataract: Secondary | ICD-10-CM | POA: Diagnosis not present

## 2019-12-06 DIAGNOSIS — M7501 Adhesive capsulitis of right shoulder: Secondary | ICD-10-CM | POA: Diagnosis not present

## 2019-12-06 DIAGNOSIS — G3184 Mild cognitive impairment, so stated: Secondary | ICD-10-CM | POA: Diagnosis not present

## 2019-12-06 DIAGNOSIS — E119 Type 2 diabetes mellitus without complications: Secondary | ICD-10-CM | POA: Diagnosis not present

## 2019-12-06 DIAGNOSIS — H269 Unspecified cataract: Secondary | ICD-10-CM | POA: Diagnosis not present

## 2019-12-06 DIAGNOSIS — M19019 Primary osteoarthritis, unspecified shoulder: Secondary | ICD-10-CM | POA: Diagnosis not present

## 2019-12-06 DIAGNOSIS — E785 Hyperlipidemia, unspecified: Secondary | ICD-10-CM | POA: Diagnosis not present

## 2019-12-11 DIAGNOSIS — M7501 Adhesive capsulitis of right shoulder: Secondary | ICD-10-CM | POA: Diagnosis not present

## 2019-12-11 DIAGNOSIS — H269 Unspecified cataract: Secondary | ICD-10-CM | POA: Diagnosis not present

## 2019-12-11 DIAGNOSIS — E119 Type 2 diabetes mellitus without complications: Secondary | ICD-10-CM | POA: Diagnosis not present

## 2019-12-11 DIAGNOSIS — E785 Hyperlipidemia, unspecified: Secondary | ICD-10-CM | POA: Diagnosis not present

## 2019-12-11 DIAGNOSIS — M19019 Primary osteoarthritis, unspecified shoulder: Secondary | ICD-10-CM | POA: Diagnosis not present

## 2019-12-11 DIAGNOSIS — G3184 Mild cognitive impairment, so stated: Secondary | ICD-10-CM | POA: Diagnosis not present

## 2019-12-17 ENCOUNTER — Encounter: Payer: Self-pay | Admitting: Family Medicine

## 2019-12-17 ENCOUNTER — Ambulatory Visit (INDEPENDENT_AMBULATORY_CARE_PROVIDER_SITE_OTHER): Payer: Medicare Other | Admitting: Family Medicine

## 2019-12-17 ENCOUNTER — Other Ambulatory Visit: Payer: Self-pay

## 2019-12-17 VITALS — BP 128/80 | HR 91 | Temp 98.0°F | Ht 70.0 in | Wt 194.5 lb

## 2019-12-17 DIAGNOSIS — Z23 Encounter for immunization: Secondary | ICD-10-CM

## 2019-12-17 DIAGNOSIS — Z794 Long term (current) use of insulin: Secondary | ICD-10-CM

## 2019-12-17 DIAGNOSIS — E785 Hyperlipidemia, unspecified: Secondary | ICD-10-CM

## 2019-12-17 DIAGNOSIS — E1165 Type 2 diabetes mellitus with hyperglycemia: Secondary | ICD-10-CM

## 2019-12-17 NOTE — Patient Instructions (Signed)
Give us 2-3 business days to get the results of your labs back.   Keep the diet clean and stay active.  Let us know if you need anything. 

## 2019-12-17 NOTE — Progress Notes (Signed)
Subjective:   Chief Complaint  Patient presents with  . Follow-up    Phillip Richard is a 69 y.o. male here for follow-up of diabetes.   Phillip Richard's self monitored glucose range is low 100's.  Patient denies hypoglycemic reactions. He checks his glucose levels 1-2 times per day. Patient does require insulin. 70/30 12 u bid Medications include: Metformin 500 mg bid Diet is healthy overall.  Exercise: walking, some wt resistance exercis  Hyperlipidemia Patient presents for dyslipidemia follow up. Currently being treated with Lipitor 40 mg/d and compliance with treatment thus far has been good. He denies myalgias. Diet/exercise.  The patient is not known to have coexisting coronary artery disease.  Past Medical History:  Diagnosis Date  . Allergic rhinitis   . Allergy   . Arthritis    shoulder  . Cataract    both eyes - just watching now  . Diabetes mellitus without complication ([place])   . History of chicken pox   . History of lead poisoning   . Hyperlipidemia   . Mild cognitive impairment   . Mumps   . Uses walker    ambulates with walker     Related testing: Retinal exam: Done Pneumovax: done  Objective:  BP 128/80 (BP Location: Left Arm, Patient Position: Sitting, Cuff Size: Normal)   Pulse 91   Temp 98 F (36.7 C) (Oral)   Ht 5\' 10"  (1.778 m)   Wt 194 lb 8 oz (88.2 kg)   SpO2 98%   BMI 27.91 kg/m  General:  Well developed, well nourished, in no apparent distress Skin:  Warm, no pallor or diaphoresis Lungs:  CTAB, no access msc use Cardio:  RRR, no bruits, no LE edema Psych: Age appropriate judgment and insight  Assessment:   Type 2 diabetes mellitus with hyperglycemia, with long-term current use of insulin (HCC) - Plan: Hemoglobin A1c, Lipid panel  Dyslipidemia  Need for influenza vaccination - Plan: Flu Vaccine QUAD High Dose(Fluad)   Plan:   1.  Status: stable.  Cont 70/30 insulin at 12 u bid and Metformin 500 mg bid. Counseled on diet and  exercise. 2.  Status: Stable.  Continue Lipitor 40 mg daily, check lipids today. F/u in 6 mo pending the above. The patient voiced understanding and agreement to the plan.  [place], DO 12/17/19 10:17 AM

## 2019-12-18 LAB — LIPID PANEL
Cholesterol: 87 mg/dL (ref <200)
HDL: 35 mg/dL — ABNORMAL LOW (ref >=40)
LDL Cholesterol (Calc): 38 mg/dL (calc)
Non-HDL Cholesterol (Calc): 52 mg/dL (calc) (ref <130)
Total CHOL/HDL Ratio: 2.5 (calc) (ref <5.0)
Triglycerides: 65 mg/dL (ref <150)

## 2019-12-18 LAB — HEMOGLOBIN A1C
Hgb A1c MFr Bld: 7.5 % of total Hgb — ABNORMAL HIGH (ref <5.7)
Mean Plasma Glucose: 169 (calc)
eAG (mmol/L): 9.3 (calc)

## 2019-12-20 DIAGNOSIS — H269 Unspecified cataract: Secondary | ICD-10-CM | POA: Diagnosis not present

## 2019-12-20 DIAGNOSIS — E785 Hyperlipidemia, unspecified: Secondary | ICD-10-CM | POA: Diagnosis not present

## 2019-12-20 DIAGNOSIS — M19019 Primary osteoarthritis, unspecified shoulder: Secondary | ICD-10-CM | POA: Diagnosis not present

## 2019-12-20 DIAGNOSIS — G3184 Mild cognitive impairment, so stated: Secondary | ICD-10-CM | POA: Diagnosis not present

## 2019-12-20 DIAGNOSIS — M7501 Adhesive capsulitis of right shoulder: Secondary | ICD-10-CM | POA: Diagnosis not present

## 2019-12-20 DIAGNOSIS — E119 Type 2 diabetes mellitus without complications: Secondary | ICD-10-CM | POA: Diagnosis not present

## 2020-01-11 DIAGNOSIS — Z23 Encounter for immunization: Secondary | ICD-10-CM | POA: Diagnosis not present

## 2020-01-21 ENCOUNTER — Other Ambulatory Visit: Payer: Self-pay | Admitting: Family Medicine

## 2020-01-21 DIAGNOSIS — E119 Type 2 diabetes mellitus without complications: Secondary | ICD-10-CM

## 2020-01-21 DIAGNOSIS — Z794 Long term (current) use of insulin: Secondary | ICD-10-CM

## 2020-03-20 ENCOUNTER — Other Ambulatory Visit: Payer: Self-pay | Admitting: Family Medicine

## 2020-03-20 DIAGNOSIS — Z794 Long term (current) use of insulin: Secondary | ICD-10-CM

## 2020-03-25 ENCOUNTER — Other Ambulatory Visit: Payer: Self-pay | Admitting: Family Medicine

## 2020-03-25 DIAGNOSIS — E1165 Type 2 diabetes mellitus with hyperglycemia: Secondary | ICD-10-CM

## 2020-05-06 ENCOUNTER — Telehealth: Payer: Self-pay | Admitting: Family Medicine

## 2020-05-06 ENCOUNTER — Encounter: Payer: Self-pay | Admitting: Family Medicine

## 2020-05-06 ENCOUNTER — Ambulatory Visit (INDEPENDENT_AMBULATORY_CARE_PROVIDER_SITE_OTHER): Payer: Medicare Other | Admitting: Family Medicine

## 2020-05-06 ENCOUNTER — Other Ambulatory Visit: Payer: Self-pay

## 2020-05-06 VITALS — BP 134/70 | HR 76 | Temp 98.1°F | Resp 17 | Ht 70.0 in | Wt 206.0 lb

## 2020-05-06 DIAGNOSIS — E1165 Type 2 diabetes mellitus with hyperglycemia: Secondary | ICD-10-CM | POA: Diagnosis not present

## 2020-05-06 DIAGNOSIS — M25532 Pain in left wrist: Secondary | ICD-10-CM

## 2020-05-06 DIAGNOSIS — M7712 Lateral epicondylitis, left elbow: Secondary | ICD-10-CM

## 2020-05-06 DIAGNOSIS — Z794 Long term (current) use of insulin: Secondary | ICD-10-CM

## 2020-05-06 MED ORDER — PREDNISONE 20 MG PO TABS: 40.0000 mg | ORAL_TABLET | Freq: Every day | ORAL | 0 refills | Status: AC

## 2020-05-06 MED ORDER — PREDNISONE 20 MG PO TABS: 40.0000 mg | ORAL_TABLET | Freq: Every day | ORAL | 0 refills | Status: DC

## 2020-05-06 NOTE — Progress Notes (Signed)
Musculoskeletal Exam  Patient: Phillip Richard DOB: [birth year]  DOS: 05/06/2020  SUBJECTIVE:  Chief Complaint:   Chief Complaint  Patient presents with  . Arm Pain    Swelling left elbow pain, no known injury, wrist swelling-left, started last week     Phillip Richard is a 70 y.o.  male for evaluation and treatment of L elbow pain.   Onset:  1 week ago. No inj or change in activity.  Location: points to lateral epicondyle Character:  aching  Progression of issue:  is unchanged Associated symptoms: swelling, some tingling; no bruising or redness Treatment: to date has been acetaminophen.   Neurovascular symptoms: no  L wrist pain Pushed off the couch due to elbow pain and felt pain in wrist. Did not feel any snaps or pops he is having mild swelling. No neuro s/s's. Tried Tylenol.   Past Medical History:  Diagnosis Date  . Allergic rhinitis   . Allergy   . Arthritis    shoulder  . Cataract    both eyes - just watching now  . Diabetes mellitus without complication ([place])   . History of chicken pox   . History of lead poisoning   . Hyperlipidemia   . Mild cognitive impairment   . Mumps   . Uses walker    ambulates with walker    Objective: VITAL SIGNS: BP 134/70 (BP Location: Left Arm, Patient Position: Sitting, Cuff Size: Normal)   Pulse 76   Temp 98.1 F (36.7 C)   Resp 17   Ht 5\' 10"  (1.778 m)   Wt 206 lb (93.4 kg)   SpO2 97%   BMI 29.56 kg/m  Constitutional: Well formed, well developed. No acute distress. Heart: Brisk cap refill Thorax & Lungs: No accessory muscle use Musculoskeletal: L elbow.   Normal active range of motion: yes.   Normal passive range of motion: yes Tenderness to palpation: yes; over lateral epicondyle Deformity: no Ecchymosis: no +TTP over the dorsum of L wrist over wrist extensors; nml active/passive ROM. Slight soft tissue edema.  Neurologic: Normal sensory function. No focal deficits noted. Sensation intact to light touch b/l.   Psychiatric: Normal mood. Age appropriate judgment and insight. Alert & oriented x 3.    Assessment:  Lateral epicondylitis of left elbow - Plan: predniSONE (DELTASONE) 20 MG tablet  Left wrist pain - Plan: predniSONE (DELTASONE) 20 MG tablet  Type 2 diabetes mellitus with hyperglycemia, with long-term current use of insulin ([place]), Chronic  Plan: 5 d pred burst, 40 mg/d.- has done well w this in past, did not elevate his sugars significantly. L wrist splint. Wear at night and during aggravating activities. This will protect both the lateral epicondyle and the wrist. Stretches/exercises, heat, ice, Tylenol.  F/u as originally scheduled next month. Injection vs PT if no better. The patient voiced understanding and agreement to the plan.   [place], DO 05/06/20  10:02 AM

## 2020-05-06 NOTE — Patient Instructions (Addendum)
Ice/cold pack over area for 10-15 min twice daily.  OK to take Tylenol 1000 mg (2 extra strength tabs) or 975 mg (3 regular strength tabs) every 6 hours as needed.  Wear the brace at night and during aggravating activities.   Let us know if you need anything.   Wrist and Forearm Exercises Do exercises exactly as told by your health care provider and adjust them as directed. It is normal to feel mild stretching, pulling, tightness, or discomfort as you do these exercises, but you should stop right away if you feel sudden pain or your pain gets worse.   RANGE OF MOTION EXERCISES These exercises warm up your muscles and joints and improve the movement and flexibility of your injured wrist and forearm. These exercises also help to relieve pain, numbness, and tingling. These exercises are done using the muscles in your injured wrist and forearm. Exercise A: Wrist Flexion, Active 1. With your fingers relaxed, bend your wrist forward as far as you can. 2. Hold this position for 30 seconds. Repeat 2 times. Complete this exercise 3 times per week. Exercise B: Wrist Extension, Active 1. With your fingers relaxed, bend your wrist backward as far as you can. 2. Hold this position for 30 seconds. Repeat 2 times. Complete this exercise 3 times per week. Exercise C: Supination, Active  1. Stand or sit with your arms at your sides. 2. Bend your left / right elbow to an "L" shape (90 degrees). 3. Turn your palm upward until you feel a gentle stretch on the inside of your forearm. 4. Hold this position for 30 seconds. 5. Slowly return your palm to the starting position. Repeat 2 times. Complete this exercise 3 times per week. Exercise D: Pronation, Active  1. Stand or sit with your arms at your sides. 2. Bend your left / right elbow to an "L" shape (90 degrees). 3. Turn your palm downward until you feel a gentle stretch on the top of your forearm. 4. Hold this position for 30 seconds. 5. Slowly  return your palm to the starting position. Repeat 2 times. Complete this exercise once a day.  STRETCHING EXERCISES These exercises warm up your muscles and joints and improve the movement and flexibility of your injured wrist and forearm. These exercises also help to relieve pain, numbness, and tingling. These exercises are done using your healthy wrist and forearm to help stretch the muscles in your injured wrist and forearm. Exercise E: Wrist Flexion, Passive  1. Extend your left / right arm in front of you, relax your wrist, and point your fingers downward. 2. Gently push on the back of your hand. Stop when you feel a gentle stretch on the top of your forearm. 3. Hold this position for 30 seconds. Repeat 2 times. Complete this exercise 3 times per week. Exercise F: Wrist Extension, Passive  1. Extend your left / right arm in front of you and turn your palm upward. 2. Gently pull your palm and fingertips back so your fingers point downward. You should feel a gentle stretch on the palm-side of your forearm. 3. Hold this position for 30 seconds. Repeat 2 times. Complete this exercise 3 times per week. Exercise G: Forearm Rotation, Supination, Passive 1. Sit with your left / right elbow bent to an "L" shape (90 degrees) with your forearm resting on a table. 2. Keeping your upper body and shoulder still, use your other hand to rotate your forearm palm-up until you feel a gentle to  moderate stretch. 3. Hold this position for 30 seconds. 4. Slowly release the stretch and return to the starting position. Repeat 2 times. Complete this exercise 3 times per week. Exercise H: Forearm Rotation, Pronation, Passive 1. Sit with your left / right elbow bent to an "L" shape (90 degrees) with your forearm resting on a table. 2. Keeping your upper body and shoulder still, use your other hand to rotate your forearm palm-down until you feel a gentle to moderate stretch. 3. Hold this position for 30  seconds. 4. Slowly release the stretch and return to the starting position. Repeat 2 times. Complete this exercise 3 times per week.  STRENGTHENING EXERCISES These exercises build strength and endurance in your wrist and forearm. Endurance is the ability to use your muscles for a long time, even after they get tired. Exercise I: Wrist Flexors  1. Sit with your left / right forearm supported on a table and your hand resting palm-up over the edge of the table. Your elbow should be bent to an "L" shape (about 90 degrees) and be below the level of your shoulder. 2. Hold a 3-5 lb weight in your left / right hand. Or, hold a rubber exercise band or tube in both hands, keeping your hands at the same level and hip distance apart. There should be a slight tension in the exercise band or tube. 3. Slowly curl your hand up toward your forearm. 4. Hold this position for 3 seconds. 5. Slowly lower your hand back to the starting position. Repeat 2 times. Complete this exercise 3 times per week. Exercise J: Wrist Extensors  1. Sit with your left / right forearm supported on a table and your hand resting palm-down over the edge of the table. Your elbow should be bent to an "L" shape (about 90 degrees) and be below the level of your shoulder. 2. Hold a 3-5 lb weight in your left / right hand. Or, hold a rubber exercise band or tube in both hands, keeping your hands at the same level and hip distance apart. There should be a slight tension in the exercise band or tube. 3. Slowly curl your hand up toward your forearm. 4. Hold this position for 3 seconds. 5. Slowly lower your hand back to the starting position. Repeat 2 times. Complete this exercise 3 times per week. Exercise K: Forearm Rotation, Supination  1. Sit with your left / right forearm supported on a table and your hand resting palm-down. Your elbow should be at your side, bent to an "L" shape (about 90 degrees), and below the level of your shoulder.  Keep your wrist stable and in a neutral position throughout the exercise. 2. Gently hold a lightweight hammer with your left / right hand. 3. Without moving your elbow or wrist, slowly rotate your palm upward to a thumbs-up position. 4. Hold this position for 3 seconds. 5. Slowly return your forearm to the starting position. Repeat 2 times. Complete this exercise 3 times per week. Exercise L: Forearm Rotation, Pronation  1. Sit with your left / right forearm supported on a table and your hand resting palm-up. Your elbow should be at your side, bent to an "L" shape (about 90 degrees), and below the level of your shoulder. Keep your wrist stable. Do not allow it to move backward or forward during the exercise. 2. Gently hold a lightweight hammer with your left / right hand. 3. Without moving your elbow or wrist, slowly rotate your palm and  hand upward to a thumbs-up position. 4. Hold this position for 3 seconds. 5. Slowly return your forearm to the starting position. Repeat 2 times. Complete this exercise 3 times per week. Exercise M: Grip Strengthening  1. Hold one of these items in your left / right hand: play dough, therapy putty, a dense sponge, a stress ball, or a large, rolled sock. 2. Squeeze as hard as you can without increasing pain. 3. Hold this position for 5 seconds. 4. Slowly release your grip. Repeat 2 times. Complete this exercise 3 times per week.  This information is not intended to replace advice given to you by your health care provider. Make sure you discuss any questions you have with your health care provider. Document Released: 12/30/2004 Document Revised: 11/10/2015 Document Reviewed: 11/10/2014 Elsevier Interactive Patient Education  2018 [employer].  Consider a forearm strap (Band-IT) to help with your elbow. This can give your elbow a break and allow it to heal faster.

## 2020-05-06 NOTE — Telephone Encounter (Signed)
Patient states that [facility] did not receive his prescription for predniSONE (DELTASONE) 20 MG tablet [[id number]] , Please resend the medication.

## 2020-05-06 NOTE — Telephone Encounter (Signed)
Resent medication to pharmacy

## 2020-06-12 ENCOUNTER — Other Ambulatory Visit: Payer: Self-pay | Admitting: Family Medicine

## 2020-06-12 DIAGNOSIS — E119 Type 2 diabetes mellitus without complications: Secondary | ICD-10-CM

## 2020-06-16 ENCOUNTER — Ambulatory Visit: Payer: Medicare Other | Admitting: Family Medicine

## 2020-06-17 ENCOUNTER — Ambulatory Visit (INDEPENDENT_AMBULATORY_CARE_PROVIDER_SITE_OTHER): Payer: Medicare Other | Admitting: Family Medicine

## 2020-06-17 ENCOUNTER — Other Ambulatory Visit: Payer: Self-pay | Admitting: Family Medicine

## 2020-06-17 ENCOUNTER — Encounter: Payer: Self-pay | Admitting: Family Medicine

## 2020-06-17 ENCOUNTER — Other Ambulatory Visit: Payer: Self-pay

## 2020-06-17 VITALS — BP 130/74 | HR 84 | Temp 97.9°F | Wt 208.4 lb

## 2020-06-17 DIAGNOSIS — R109 Unspecified abdominal pain: Secondary | ICD-10-CM

## 2020-06-17 DIAGNOSIS — E119 Type 2 diabetes mellitus without complications: Secondary | ICD-10-CM

## 2020-06-17 DIAGNOSIS — E785 Hyperlipidemia, unspecified: Secondary | ICD-10-CM | POA: Diagnosis not present

## 2020-06-17 DIAGNOSIS — E1165 Type 2 diabetes mellitus with hyperglycemia: Secondary | ICD-10-CM

## 2020-06-17 DIAGNOSIS — Z794 Long term (current) use of insulin: Secondary | ICD-10-CM

## 2020-06-17 LAB — COMPREHENSIVE METABOLIC PANEL
ALT: 25 U/L (ref 0–53)
AST: 22 U/L (ref 0–37)
Albumin: 3.9 g/dL (ref 3.5–5.2)
Alkaline Phosphatase: 100 U/L (ref 39–117)
BUN: 17 mg/dL (ref 6–23)
CO2: 29 mEq/L (ref 19–32)
Calcium: 9.6 mg/dL (ref 8.4–10.5)
Chloride: 107 mEq/L (ref 96–112)
Creatinine, Ser: 1 mg/dL (ref 0.40–1.50)
GFR: 75.51 mL/min (ref >60.00)
Glucose, Bld: 112 mg/dL — ABNORMAL HIGH (ref 70–99)
Potassium: 4.4 mEq/L (ref 3.5–5.1)
Sodium: 142 mEq/L (ref 135–145)
Total Bilirubin: 0.4 mg/dL (ref 0.2–1.2)
Total Protein: 7.3 g/dL (ref 6.0–8.3)

## 2020-06-17 LAB — MICROALBUMIN / CREATININE URINE RATIO
Creatinine,U: 172.6 mg/dL
Microalb Creat Ratio: 5.6 mg/g (ref 0.0–30.0)
Microalb, Ur: 9.6 mg/dL — ABNORMAL HIGH (ref 0.0–1.9)

## 2020-06-17 LAB — LIPID PANEL
Cholesterol: 78 mg/dL (ref 0–200)
HDL: 34.3 mg/dL — ABNORMAL LOW (ref >39.00)
LDL Cholesterol: 30 mg/dL (ref 0–99)
NonHDL: 43.54
Total CHOL/HDL Ratio: 2
Triglycerides: 66 mg/dL (ref 0.0–149.0)
VLDL: 13.2 mg/dL (ref 0.0–40.0)

## 2020-06-17 LAB — HEMOGLOBIN A1C: Hgb A1c MFr Bld: 8.7 % — ABNORMAL HIGH (ref 4.6–6.5)

## 2020-06-17 MED ORDER — NOVOLOG MIX 70/30 FLEXPEN (70-30) 100 UNIT/ML ~~LOC~~ SUPN: 14.0000 [IU] | PEN_INJECTOR | Freq: Two times a day (BID) | SUBCUTANEOUS | 1 refills | Status: DC

## 2020-06-17 MED ORDER — METFORMIN HCL 500 MG PO TABS: 1000.0000 mg | ORAL_TABLET | Freq: Two times a day (BID) | ORAL | 3 refills | Status: DC

## 2020-06-17 NOTE — Patient Instructions (Addendum)
Give [place] 2-3 business days to get the results of your labs back. We will be in touch regarding your follow up, which is based on your results.   Keep the diet clean and stay active.  Stretch the area on your side. Hold for 30 seconds and repeat twice. This may feel better to do on both sides.  Ice/cold pack over area for 10-15 min twice daily.  Heat (pad or rice pillow in microwave) over affected area, 10-15 minutes twice daily.   OK to take Tylenol 1000 mg (2 extra strength tabs) or 975 mg (3 regular strength tabs) every 6 hours as needed.  Let us know if you need anything.

## 2020-06-17 NOTE — Progress Notes (Signed)
Subjective:   Chief Complaint  Patient presents with  . Follow-up  . Flank Pain    Right flank pain when standing up    Phillip Richard is a 70 y.o. male here for follow-up of diabetes.   Phillip Richard's self monitored glucose range is mid 100's.  Patient denies hypoglycemic reactions. He checks his glucose levels 1-2 time(s) per day. Patient does require insulin.  70-30 12 u bid Medications include: metformin 500 mg bid Diet is fair.  Exercise: some walking No CP or SOB  Hyperlipidemia Patient presents for dyslipidemia follow up. Currently being treated with Lipitor 40 mg/d and compliance with treatment thus far has been good. He denies myalgias. Diet/exercise as above.  The patient is not known to have coexisting coronary artery disease.  R side pain over past few days after he stood up too fast. No trauma. No bruising, swelling, redness. No bowel changes. Has not tried anything at home so far. Wonders if it is related to his weight/belly.   Past Medical History:  Diagnosis Date  . Allergic rhinitis   . Allergy   . Arthritis    shoulder  . Cataract    both eyes - just watching now  . Diabetes mellitus without complication ([place])   . History of chicken pox   . History of lead poisoning   . Hyperlipidemia   . Mild cognitive impairment   . Mumps   . Uses walker    ambulates with walker     Related testing: Retinal exam: Due Pneumovax: done  Objective:  BP 130/74 (BP Location: Left Arm, Patient Position: Sitting, Cuff Size: Normal)   Pulse 84   Temp 97.9 F (36.6 C) (Oral)   Wt 208 lb 6 oz (94.5 kg)   SpO2 93%   BMI 29.90 kg/m  General:  Well developed, well nourished, in no apparent distress Skin:  No redness over R side Head:  Normocephalic, atraumatic Eyes:  Pupils equal and round, sclera anicteric without injection  Lungs:  CTAB, no access msc use Cardio:  RRR, no bruits, no LE edema Musculoskeletal:  +TTP over R oblique Psych: Nml affect and  mood  Assessment:   Type 2 diabetes mellitus with hyperglycemia, with long-term current use of insulin (HCC) - Plan: Comprehensive metabolic panel, Lipid panel, Hemoglobin A1c, Microalbumin / creatinine urine ratio, Ambulatory referral to Ophthalmology  Dyslipidemia  Side pain   Plan:   1. Cont 70/30 12 u bid and metformin 500 mg bid. Cont checking sugars. Counseled on diet and exercise. 2. Cont Lipitor 40 mg/d.  3. Stretch, ice, heat, Tylenol.  F/u in 3-6 mo pending above. The patient voiced understanding and agreement to the plan.  [place], DO 06/17/20 9:49 AM

## 2020-06-26 IMAGING — DX DG KNEE COMPLETE 4+V*R*
4 series · 4 of 4 positions shown · non-contrast
Comparison: None

CLINICAL DATA: Pain following fall

EXAM:
RIGHT KNEE - COMPLETE 4+ VIEW

[knee ap]
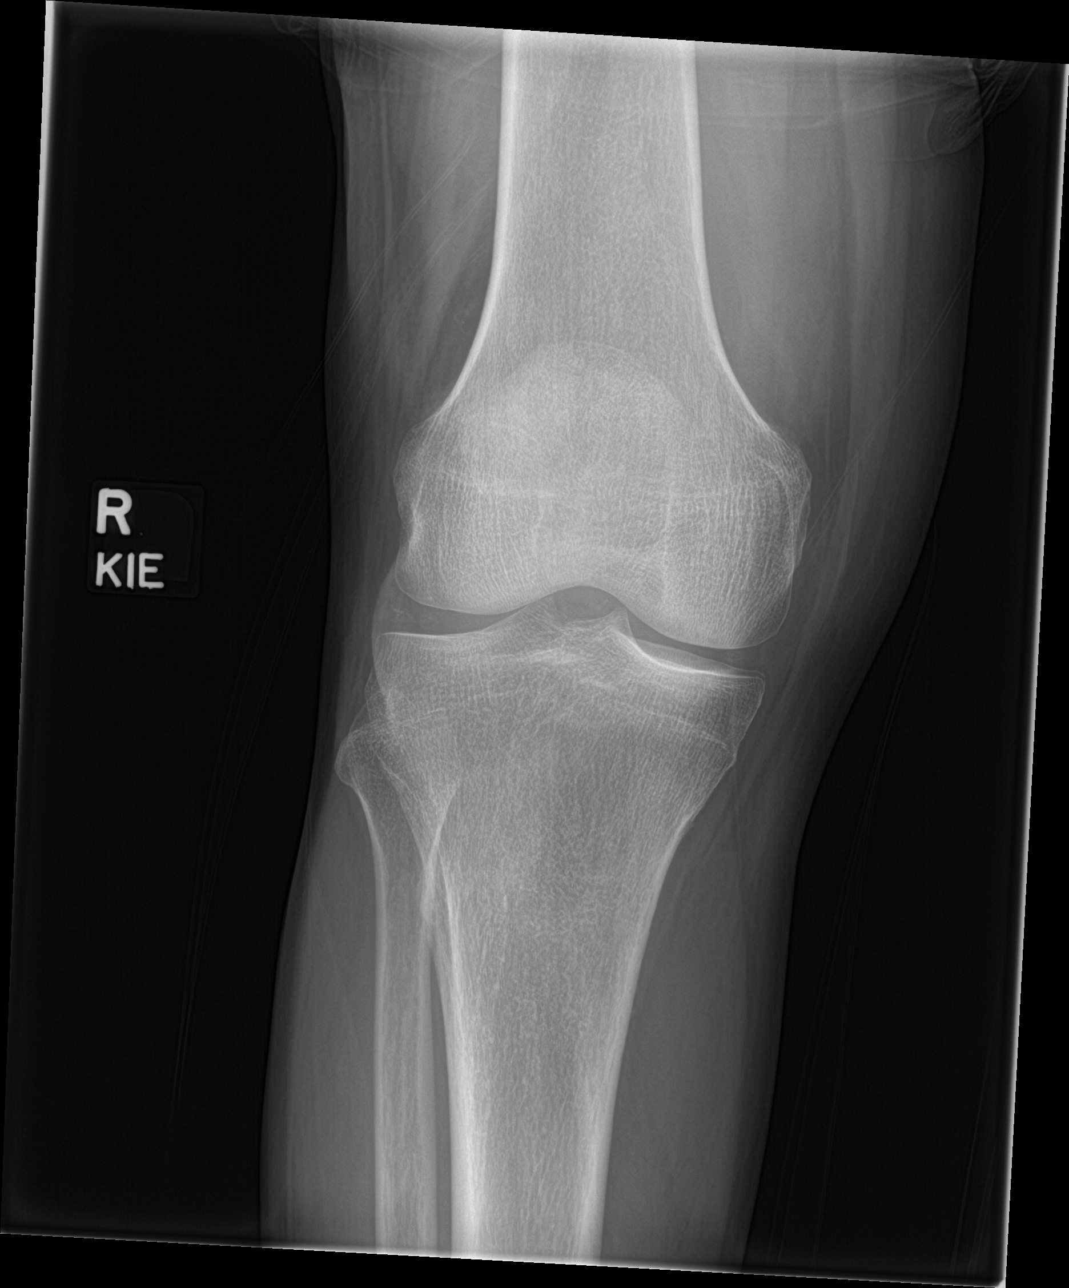

[knee lat]
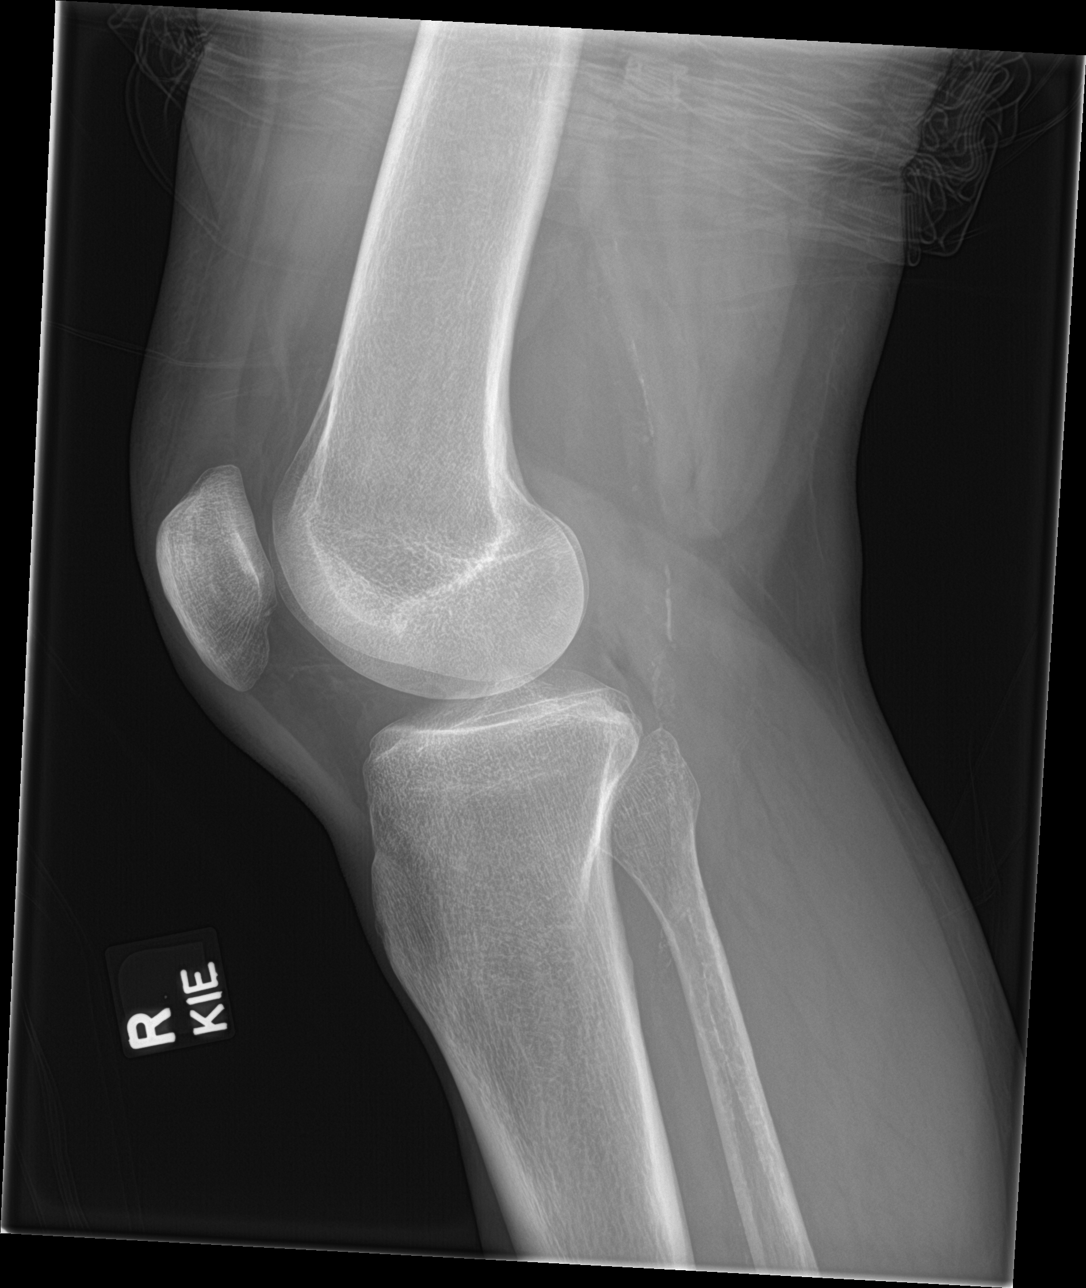

[knee obl (1 of 2)]
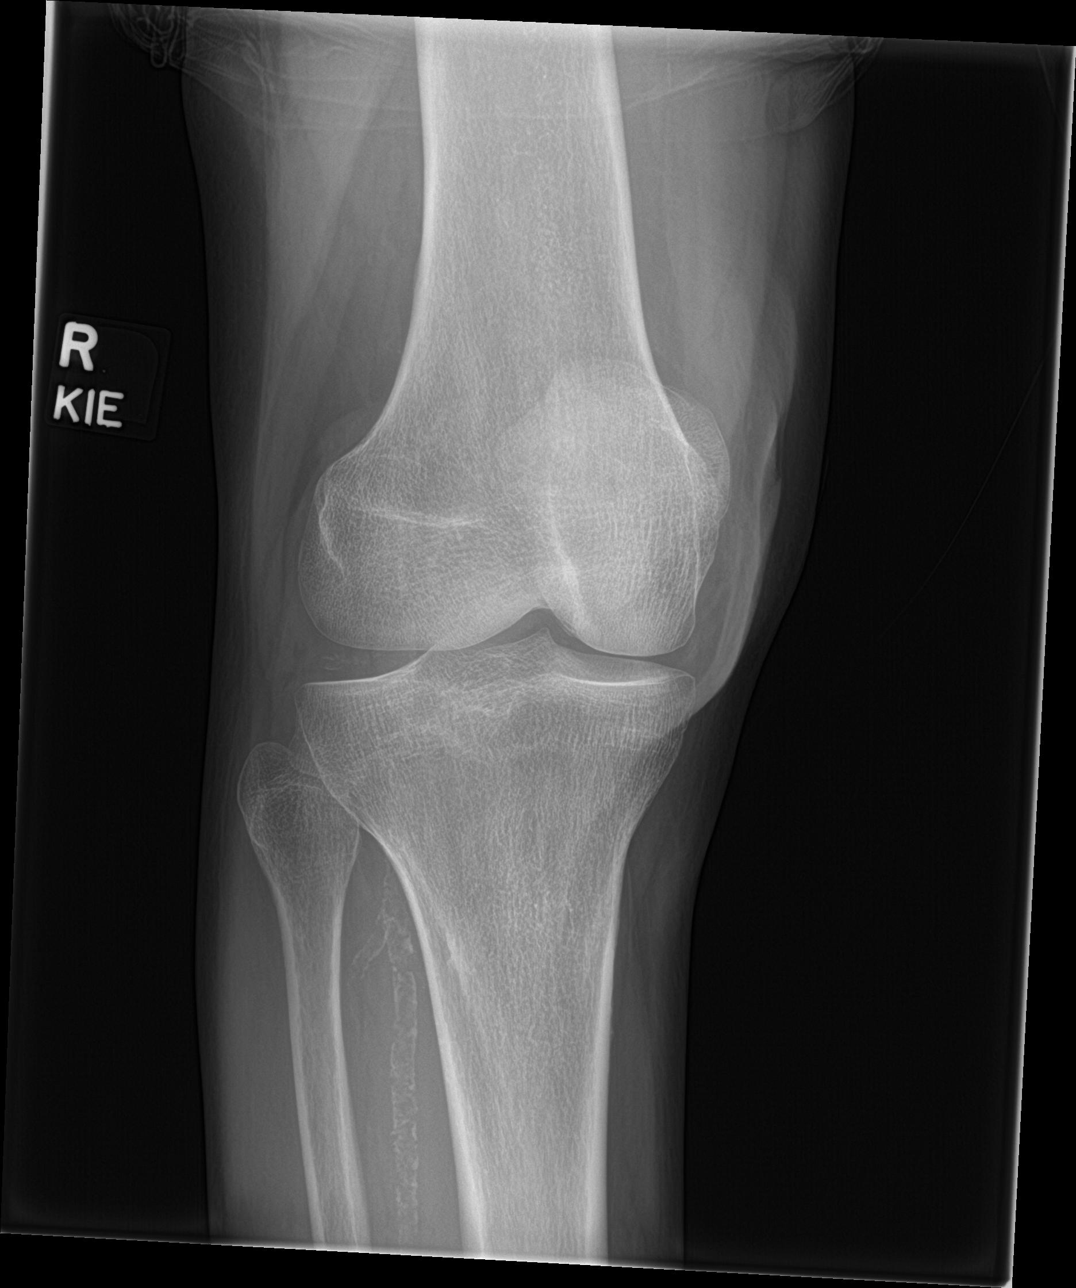

[knee obl (2 of 2)]
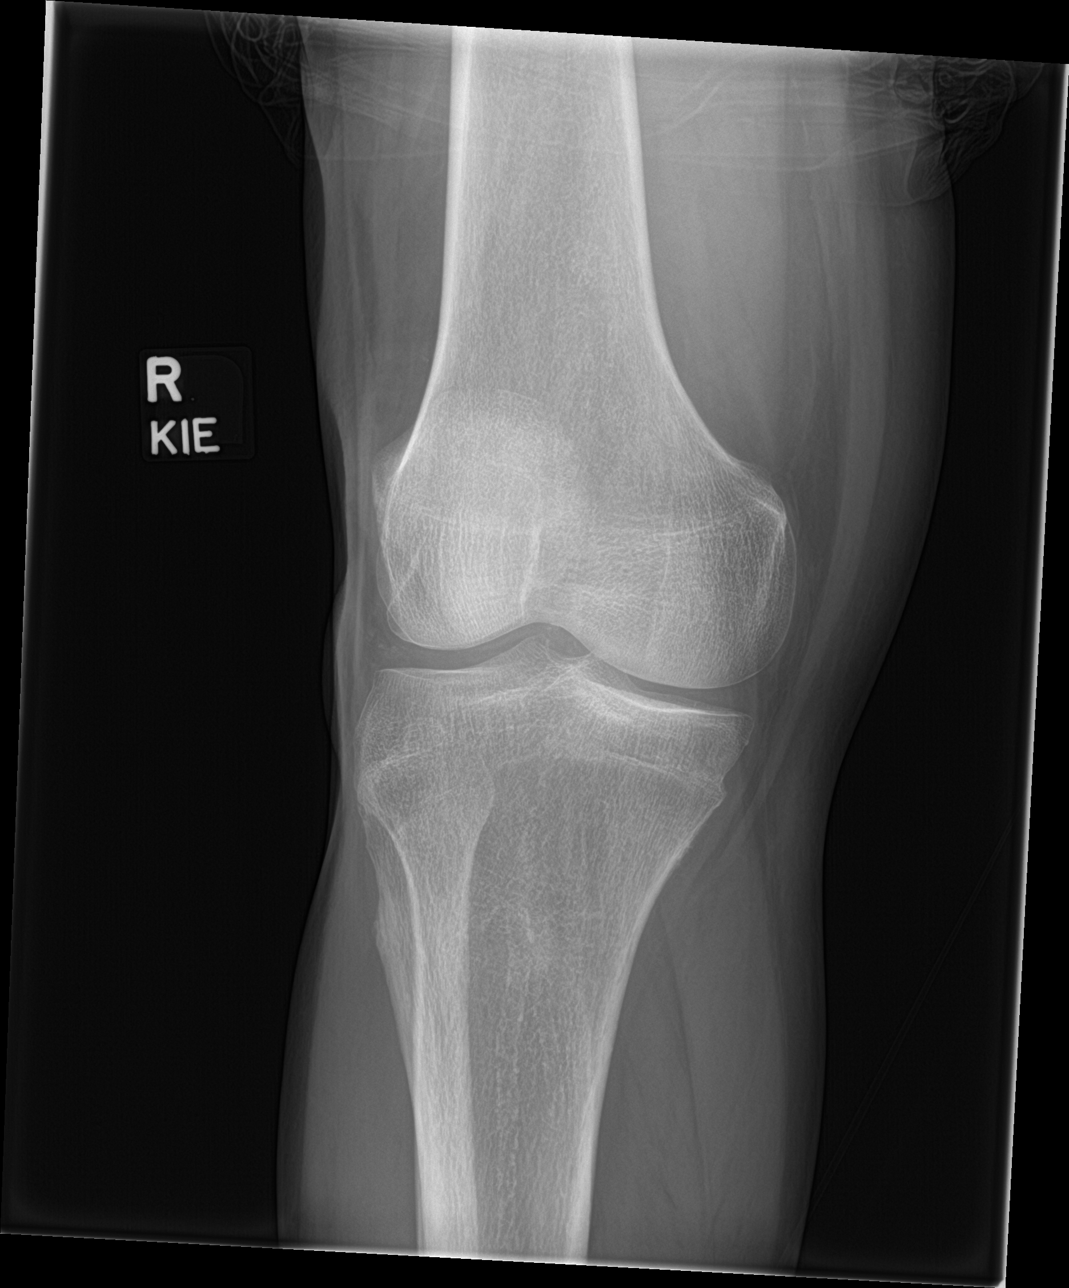

[4 of 4 positions shown; findings below may reference images not displayed]

FINDINGS: Frontal, lateral, and bilateral oblique views were obtained. No
evident fracture or dislocation. No appreciable joint effusion.
There is no appreciable joint space narrowing or erosion. There is,
however, chondrocalcinosis present.

There are multiple foci of arterial vascular calcification.
IMPRESSION: There is chondrocalcinosis present, a finding that may be seen with
osteoarthritis or calcium pyrophosphate deposition disease. No
appreciable joint space narrowing. No fracture, dislocation, or
effusion. There are multiple foci of arterial vascular
calcification.

## 2020-07-11 DIAGNOSIS — Z23 Encounter for immunization: Secondary | ICD-10-CM | POA: Diagnosis not present

## 2020-07-18 ENCOUNTER — Encounter: Payer: Self-pay | Admitting: Family Medicine

## 2020-07-18 ENCOUNTER — Ambulatory Visit (INDEPENDENT_AMBULATORY_CARE_PROVIDER_SITE_OTHER): Payer: Medicare Other | Admitting: Family Medicine

## 2020-07-18 ENCOUNTER — Other Ambulatory Visit: Payer: Self-pay

## 2020-07-18 VITALS — BP 124/82 | HR 85 | Temp 97.6°F | Resp 18 | Ht 70.0 in | Wt 205.0 lb

## 2020-07-18 DIAGNOSIS — Z794 Long term (current) use of insulin: Secondary | ICD-10-CM

## 2020-07-18 DIAGNOSIS — E119 Type 2 diabetes mellitus without complications: Secondary | ICD-10-CM

## 2020-07-18 MED ORDER — NOVOLOG MIX 70/30 FLEXPEN (70-30) 100 UNIT/ML ~~LOC~~ SUPN: 16.0000 [IU] | PEN_INJECTOR | Freq: Two times a day (BID) | SUBCUTANEOUS | 1 refills | Status: DC

## 2020-07-18 NOTE — Patient Instructions (Addendum)
Continue the metformin 2 tabs twice daily.   We are increasing the insulin from 14 units twice daily to 16 units twice daily.   Please continue to check your sugars at home.  Keep the diet clean and stay active. Try to avoid apple juice and cranberry juice. It has as much sugar as soda in some instances.   It's OK to eat a little unhealthy for your birthday.  Let [place] know if you need anything.

## 2020-07-18 NOTE — Progress Notes (Signed)
Chief Complaint  Patient presents with  . Diabetes    1 month follow up     Subjective: Patient is a 70 y.o. male here for follow-up.  Last A1c 1 month ago was 8.7.  We increased his insulin to 14 units twice daily of 70/30.  His metformin was also increased to 1000 mg twice daily.  He is tolerating these changes well.  Fasting sugars are in the low to mid 100s.  Random sugars range from the high 100s to low 200s.  He is not having any low readings.  Diet is fair.  He tries to stay active.  No chest pain or shortness of breath.  Past Medical History:  Diagnosis Date  . Allergic rhinitis   . Allergy   . Arthritis    shoulder  . Cataract    both eyes - just watching now  . Diabetes mellitus without complication ([place])   . History of chicken pox   . History of lead poisoning   . Hyperlipidemia   . Mild cognitive impairment   . Mumps   . Uses walker    ambulates with walker    Objective: BP 124/82 (BP Location: Right Arm, Patient Position: Sitting, Cuff Size: Normal)   Pulse 85   Temp 97.6 F (36.4 C)   Resp 18   Ht 5\' 10"  (1.778 m)   Wt 205 lb (93 kg)   SpO2 98%   BMI 29.41 kg/m  General: Awake, appears stated age HEENT: MMM, EOMi Heart: RRR, no lower extremity edema Lungs: CTAB, no rales, wheezes or rhonchi. No accessory muscle use Psych:  normal affect and mood  Assessment and Plan: Type 2 diabetes mellitus without complication, with long-term current use of insulin ([place]) - Plan: insulin aspart protamine - aspart (NOVOLOG MIX 70/30 FLEXPEN) (70-30) 100 UNIT/ML FlexPen  Status uncontrolled.  Increased doses of NovoLog 70/30 from 14 units twice daily to 16 units twice daily.  Continue metformin XR 1000 mg twice daily.  Counseled on diet and exercise.  Continue to monitor sugars at home. I will see him in 2 months for diabetes visit.  We will check A1c at that visit. The patient voiced understanding and agreement to the plan.  [place], DO 07/18/20   10:47 AM

## 2020-07-29 ENCOUNTER — Telehealth: Payer: Self-pay | Admitting: Family Medicine

## 2020-07-29 NOTE — Telephone Encounter (Signed)
Employee from [employer] dropped off document to be filled out by provider (5 pages [employer] - document) Employee stated ok to fax document when ready. Document put at front office tray under providers name.

## 2020-07-30 NOTE — Telephone Encounter (Signed)
Personal Service plan has been signed by PCP and faxed back to [place] at [phone]

## 2020-08-01 DIAGNOSIS — Z20828 Contact with and (suspected) exposure to other viral communicable diseases: Secondary | ICD-10-CM | POA: Diagnosis not present

## 2020-08-01 LAB — HM DIABETES EYE EXAM

## 2020-08-08 DIAGNOSIS — Z20828 Contact with and (suspected) exposure to other viral communicable diseases: Secondary | ICD-10-CM | POA: Diagnosis not present

## 2020-08-15 DIAGNOSIS — Z20828 Contact with and (suspected) exposure to other viral communicable diseases: Secondary | ICD-10-CM | POA: Diagnosis not present

## 2020-08-28 ENCOUNTER — Other Ambulatory Visit: Payer: Self-pay | Admitting: Family Medicine

## 2020-08-28 DIAGNOSIS — Z794 Long term (current) use of insulin: Secondary | ICD-10-CM

## 2020-09-02 ENCOUNTER — Other Ambulatory Visit: Payer: Self-pay | Admitting: Family Medicine

## 2020-09-03 ENCOUNTER — Other Ambulatory Visit: Payer: Self-pay

## 2020-09-03 ENCOUNTER — Ambulatory Visit (INDEPENDENT_AMBULATORY_CARE_PROVIDER_SITE_OTHER): Payer: Medicare Other | Admitting: Family Medicine

## 2020-09-03 ENCOUNTER — Encounter: Payer: Self-pay | Admitting: Family Medicine

## 2020-09-03 VITALS — BP 120/80 | HR 85 | Temp 98.1°F | Ht 70.5 in | Wt 201.0 lb

## 2020-09-03 DIAGNOSIS — H6123 Impacted cerumen, bilateral: Secondary | ICD-10-CM | POA: Diagnosis not present

## 2020-09-03 NOTE — Progress Notes (Signed)
Chief Complaint  Patient presents with   Ear Fullness    Subjective: Patient is a 70 y.o. male here for ear fullness.  Both ears feel full over past 3 d. No pain, drainage, fevers, trauma. Hearing is decreased. He has a hx of wax buildup.   Past Medical History:  Diagnosis Date   Allergic rhinitis    Allergy    Arthritis    shoulder   Cataract    both eyes - just watching now   Diabetes mellitus without complication (HCC)    History of chicken pox    History of lead poisoning    Hyperlipidemia    Mild cognitive impairment    Mumps    Uses walker    ambulates with walker    Objective: BP 120/80   Pulse 85   Temp 98.1 F (36.7 C) (Oral)   Ht 5' 10.5" (1.791 m)   Wt 201 lb (91.2 kg)   SpO2 95%   BMI 28.43 kg/m  General: Awake, appears stated age HEENT: Canals 100% obstructed w cerumen.  Lungs: No accessory muscle use Psych: Normal affect and mood  Assessment and Plan: Bilateral impacted cerumen  Flushed today. Home care instructions provided.  F/u as originally scheduled.  The patient voiced understanding and agreement to the plan.  [place], DO 09/03/20  10:25 AM

## 2020-09-03 NOTE — Patient Instructions (Signed)
OK to use Debrox (peroxide) in the ear to loosen up wax. Also recommend using a bulb syringe (for removing boogers from baby's noses) to flush through warm water and vinegar (3-4:1 ratio). An alternative, though more expensive, is an elephant ear washer wax removal kit. Do not use Q-tips as this can impact wax further.  Let us know if you need anything.;et

## 2020-09-04 ENCOUNTER — Telehealth: Payer: Self-pay | Admitting: Family Medicine

## 2020-09-04 NOTE — Telephone Encounter (Signed)
Employee from [place] dropped off document to be filled out by provider (Documents in an orange folder 5 pages) Document needs to be faxed in when done to [phone] Attention [place]. Document put at front office tray under providers name.

## 2020-09-05 NOTE — Telephone Encounter (Signed)
PCP completed Faxed Received fax confirmation

## 2020-09-16 ENCOUNTER — Ambulatory Visit (INDEPENDENT_AMBULATORY_CARE_PROVIDER_SITE_OTHER): Payer: Medicare Other | Admitting: Family Medicine

## 2020-09-16 ENCOUNTER — Encounter: Payer: Self-pay | Admitting: Family Medicine

## 2020-09-16 VITALS — BP 120/78 | HR 92 | Temp 97.6°F | Ht 71.0 in | Wt 207.1 lb

## 2020-09-16 DIAGNOSIS — Z794 Long term (current) use of insulin: Secondary | ICD-10-CM

## 2020-09-16 DIAGNOSIS — E1165 Type 2 diabetes mellitus with hyperglycemia: Secondary | ICD-10-CM | POA: Diagnosis not present

## 2020-09-16 DIAGNOSIS — R5383 Other fatigue: Secondary | ICD-10-CM

## 2020-09-16 LAB — CBC
HCT: 43.5 % (ref 39.0–52.0)
Hemoglobin: 14.4 g/dL (ref 13.0–17.0)
MCHC: 33 g/dL (ref 30.0–36.0)
MCV: 83.4 fl (ref 78.0–100.0)
Platelets: 192 10*3/uL (ref 150.0–400.0)
RBC: 5.21 Mil/uL (ref 4.22–5.81)
RDW: 15.4 % (ref 11.5–15.5)
WBC: 9.3 10*3/uL (ref 4.0–10.5)

## 2020-09-16 LAB — HEMOGLOBIN A1C: Hgb A1c MFr Bld: 7.7 % — ABNORMAL HIGH (ref 4.6–6.5)

## 2020-09-16 LAB — TSH: TSH: 2.15 u[IU]/mL (ref 0.35–5.50)

## 2020-09-16 NOTE — Patient Instructions (Addendum)
Give [place] 2-3 business days to get the results of your labs back. Our follow up will be dependent on these results.   Keep the diet clean and stay active.  Schedule your eye exam.   Aim to do some physical exertion for 150 minutes per week. This is typically divided into 5 days per week, 30 minutes per day. The activity should be enough to get your heart rate up. Anything is better than nothing if you have time constraints.  Continue to monitor your sugars.   The new Shingrix vaccine (for shingles) is a 2 shot series. It can make people feel low energy, achy and almost like they have the flu for 48 hours after injection. Please plan accordingly when deciding on when to get this shot. Call your pharmacy or have your group home get you this. The second shot of the series is less severe regarding the side effects, but it still lasts 48 hours.   Let us know if you need anything.

## 2020-09-16 NOTE — Progress Notes (Signed)
Subjective:   Chief Complaint  Patient presents with   Follow-up    Phillip Richard is a 70 y.o. male here for follow-up of diabetes.   Phillip Richard's self monitored glucose range is low 100's fasting, <200 random.  Patient denies hypoglycemic reactions. He checks his glucose levels 2 time(s) per day. Patient does require insulin- 70/30 12 u bid.   Medications include: metformin 1000 mg bid Diet is healthy minus some sugary cereals.  Exercise: walking No CP or SOB Last A1c was 8.7.   Past Medical History:  Diagnosis Date   Allergic rhinitis    Allergy    Arthritis    shoulder   Cataract    both eyes - just watching now   Diabetes mellitus without complication (HCC)    History of chicken pox    History of lead poisoning    Hyperlipidemia    Mild cognitive impairment    Mumps    Uses walker    ambulates with walker     Related testing: Retinal exam: Done, due soon Pneumovax: done  Objective:  BP 120/78   Pulse 92   Temp 97.6 F (36.4 C) (Oral)   Ht 5\' 11"  (1.803 m)   Wt 207 lb 2 oz (94 kg)   SpO2 96%   BMI 28.89 kg/m  General:  Well developed, well nourished, in no apparent distress Skin:  Warm, no pallor or diaphoresis Head:  Normocephalic, atraumatic Eyes:  Pupils equal and round, sclera anicteric without injection  Lungs:  CTAB, no access msc use Cardio:  RRR, no bruits, no LE edema Psych: Nml affect and mood  Assessment:   Type 2 diabetes mellitus with hyperglycemia, with long-term current use of insulin (HCC) - Plan: Hemoglobin A1c   Plan:   Chronic, uncontrolled. Cont 70/30 12 u bid, metformin 1000 mg bid. Ck above. Goal is <8 a1c. Counseled on diet and exercise. Shingrix rec'd.  F/u in 3-6 mo pending results.  The patient voiced understanding and agreement to the plan.  [place], DO 09/16/20 11:15 AM

## 2020-10-01 DIAGNOSIS — H5201 Hypermetropia, right eye: Secondary | ICD-10-CM | POA: Diagnosis not present

## 2020-10-01 DIAGNOSIS — H31001 Unspecified chorioretinal scars, right eye: Secondary | ICD-10-CM | POA: Diagnosis not present

## 2020-10-01 DIAGNOSIS — Z794 Long term (current) use of insulin: Secondary | ICD-10-CM | POA: Diagnosis not present

## 2020-10-01 DIAGNOSIS — E119 Type 2 diabetes mellitus without complications: Secondary | ICD-10-CM | POA: Diagnosis not present

## 2020-10-01 DIAGNOSIS — H524 Presbyopia: Secondary | ICD-10-CM | POA: Diagnosis not present

## 2020-10-01 DIAGNOSIS — H5212 Myopia, left eye: Secondary | ICD-10-CM | POA: Diagnosis not present

## 2020-10-01 DIAGNOSIS — H35033 Hypertensive retinopathy, bilateral: Secondary | ICD-10-CM | POA: Diagnosis not present

## 2020-10-01 DIAGNOSIS — H52203 Unspecified astigmatism, bilateral: Secondary | ICD-10-CM | POA: Diagnosis not present

## 2020-10-01 DIAGNOSIS — H2513 Age-related nuclear cataract, bilateral: Secondary | ICD-10-CM | POA: Diagnosis not present

## 2020-10-21 ENCOUNTER — Other Ambulatory Visit: Payer: Self-pay | Admitting: Family Medicine

## 2020-10-21 DIAGNOSIS — E119 Type 2 diabetes mellitus without complications: Secondary | ICD-10-CM

## 2020-10-21 DIAGNOSIS — Z794 Long term (current) use of insulin: Secondary | ICD-10-CM

## 2020-10-25 ENCOUNTER — Other Ambulatory Visit: Payer: Self-pay | Admitting: Family Medicine

## 2020-10-25 DIAGNOSIS — M79604 Pain in right leg: Secondary | ICD-10-CM

## 2020-10-27 ENCOUNTER — Telehealth: Payer: Self-pay | Admitting: Family Medicine

## 2020-10-27 NOTE — Telephone Encounter (Signed)
Dropped off and put in [facility] bin up front  Patients sister would like to be called to pick it up

## 2020-10-27 NOTE — Telephone Encounter (Signed)
The patients sister Lorriane Shire) will be dropping off the patients Medicaid and FL2   recertification paperwork off and needs to be done asap.

## 2020-10-28 NOTE — Telephone Encounter (Signed)
Form completed. Copied and sent copy to scan. Put form/copy of medication list in envelope and put at the front for the patients sister Lorriane Shire to pickup. Called her on both  numbers left msg that was completed and could pickup today.

## 2020-12-12 ENCOUNTER — Other Ambulatory Visit (HOSPITAL_BASED_OUTPATIENT_CLINIC_OR_DEPARTMENT_OTHER): Payer: Self-pay

## 2020-12-12 DIAGNOSIS — Z23 Encounter for immunization: Secondary | ICD-10-CM | POA: Diagnosis not present

## 2020-12-12 MED ORDER — SHINGRIX 50 MCG/0.5ML IM SUSR
INTRAMUSCULAR | 0 refills | Status: DC
  Filled 2020-12-12: qty 1, 1d supply, fill #0

## 2020-12-12 MED ORDER — INFLUENZA VAC A&B SA ADJ QUAD 0.5 ML IM PRSY
PREFILLED_SYRINGE | INTRAMUSCULAR | 0 refills | Status: DC
  Filled 2020-12-12: qty 0.5, 1d supply, fill #0

## 2021-01-12 ENCOUNTER — Telehealth: Payer: Self-pay | Admitting: *Deleted

## 2021-01-12 NOTE — Telephone Encounter (Signed)
FYI  Who Is Calling Patient / Member / Family / Caregiver Caller Name Trinna Balloon Phone Number [phone] Reason for Call Symptomatic / Request for Health Information Initial Comment Caller is from [facility]. Pt fell without injury.  01/09/2021 11:48:47 PM General Information Provided Yes [place]

## 2021-01-12 NOTE — Telephone Encounter (Signed)
Called and spoke to his sister Lorriane Shire to see how he is going. She said he is going well and he was checked on yesterday by his family. He actually did not fall, but getting out of his bed he slid down. He is fine and no appointment needed.

## 2021-01-13 ENCOUNTER — Encounter: Payer: Self-pay | Admitting: Family Medicine

## 2021-01-13 ENCOUNTER — Telehealth: Payer: Self-pay | Admitting: Family Medicine

## 2021-01-13 ENCOUNTER — Telehealth (INDEPENDENT_AMBULATORY_CARE_PROVIDER_SITE_OTHER): Payer: Medicare Other | Admitting: Family Medicine

## 2021-01-13 DIAGNOSIS — U071 COVID-19: Secondary | ICD-10-CM

## 2021-01-13 MED ORDER — FLUTICASONE PROPIONATE 50 MCG/ACT NA SUSP: 2.0000 | Freq: Every day | NASAL | 2 refills | Status: DC

## 2021-01-13 MED ORDER — MOLNUPIRAVIR EUA 200MG CAPSULE: 4.0000 | ORAL_CAPSULE | Freq: Two times a day (BID) | ORAL | 0 refills | Status: AC

## 2021-01-13 MED ORDER — BENZONATATE 200 MG PO CAPS: 200.0000 mg | ORAL_CAPSULE | Freq: Two times a day (BID) | ORAL | 0 refills | Status: DC | PRN

## 2021-01-13 NOTE — Telephone Encounter (Signed)
The sister just found out the patient tested positive for Covid. He was tested a couple times last week, but he was never told he was positive. Tested positive on Monday 01/12/21. Sunday night he was told by the nursing home he was positive. He started having symptoms over the weekend/sore throat and congestion. Is a visit needed at this time.  The patient is difficult to get a hold of at [facility].

## 2021-01-13 NOTE — Progress Notes (Signed)
CC: Covid  Phillip Richard here for URI complaints. Due to COVID-19 pandemic, we are interacting via web portal for an electronic face-to-face visit. I verified patient's ID using 2 identifiers. Patient agreed to proceed with visit via this method. Patient is at home, I am at office. 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   Duration: 4 days  Associated symptoms: sinus congestion, rhinorrhea, itchy watery eyes, and coughing Denies: sinus pain, ear pain, ear drainage, sore throat, wheezing, shortness of breath, myalgia, and fevers, N/V/D, loss of taste/smell Treatment to date: Mucinex, cough drops Sick contacts: Yes; other members sick at living facility  Past Medical History:  Diagnosis Date   Allergic rhinitis    Allergy    Arthritis    shoulder   Cataract    both eyes - just watching now   Diabetes mellitus without complication ([place])    History of chicken pox    History of lead poisoning    Hyperlipidemia    Mild cognitive impairment    Mumps    Uses walker    ambulates with walker    Objective No conversational dyspnea Age appropriate judgment and insight Nml affect and mood  COVID-19 - Plan: molnupiravir EUA (LAGEVRIO) 200 mg CAPS capsule, benzonatate (TESSALON) 200 MG capsule, fluticasone (FLONASE) 50 MCG/ACT nasal spray  Given age and co-morbidities, will tx w antiviral. Discussed quarantining.  Continue to push fluids, practice good hand hygiene, cover mouth when coughing. F/u prn. If starting to experience irreplaceable fluid loss, shaking, or shortness of breath, seek immediate care. Pt voiced understanding and agreement to the plan.  [place], DO 01/13/21 11:40 AM

## 2021-01-15 ENCOUNTER — Telehealth: Payer: Self-pay | Admitting: Family Medicine

## 2021-01-15 ENCOUNTER — Telehealth: Payer: Self-pay | Admitting: *Deleted

## 2021-01-15 DIAGNOSIS — U071 COVID-19: Secondary | ICD-10-CM

## 2021-01-15 MED ORDER — BENZONATATE 200 MG PO CAPS: 200.0000 mg | ORAL_CAPSULE | Freq: Two times a day (BID) | ORAL | 0 refills | Status: DC | PRN

## 2021-01-15 NOTE — Telephone Encounter (Signed)
Spoke with Melissa and notified her

## 2021-01-15 NOTE — Telephone Encounter (Signed)
OK to take both. Tessalon Perles 1 tab by mouth every 8 hrs prn cough. Syrup should be every 6 hrs as needed for cough prn, but he may want to take a few hrs before bed first to ensure no drowsiness. Ty.

## 2021-01-15 NOTE — Telephone Encounter (Signed)
Pharmacy states that they never received benzonatate (TESSALON) 200 MG capsule   but instead received promethazine cough syrup 6.25 ml, but they are not able to measure and could only fill a 7.5. They states they can receive verbal orders. Melissa can be reached [phone]. Please advise.

## 2021-01-15 NOTE — Telephone Encounter (Signed)
Rest home only has 7.5 or 5 ml no 6.25 ml ( measuring cup), need to know if they can give him 7.5 instead of 6.33ml and bottle says every 8 , so do you want them to do every 6 or every 8

## 2021-01-15 NOTE — Telephone Encounter (Signed)
Drug is not covered by plan, tried doing prior auth anyway but plan does not cover this drug.

## 2021-01-15 NOTE — Telephone Encounter (Signed)
Spoke with pharmacy stated they never received tessalon , but the cough medicine instead , is it ok to re-send in the tessalon and also melissa at the rest home stated they need dosing for the cough medicine if he is supposed to take it

## 2021-01-15 NOTE — Telephone Encounter (Signed)
Prior auth started via cover my meds.  Awaiting determination.  Key: [id number]

## 2021-01-23 ENCOUNTER — Other Ambulatory Visit: Payer: Self-pay | Admitting: Family Medicine

## 2021-01-23 DIAGNOSIS — M79604 Pain in right leg: Secondary | ICD-10-CM

## 2021-01-29 ENCOUNTER — Telehealth: Payer: Self-pay | Admitting: Family Medicine

## 2021-01-29 NOTE — Telephone Encounter (Signed)
Phillip Richard  Needs copy of immunization record faxed over. They don't have any record of it.  [phone]

## 2021-01-30 NOTE — Telephone Encounter (Signed)
done

## 2021-02-21 ENCOUNTER — Other Ambulatory Visit: Payer: Self-pay | Admitting: Family Medicine

## 2021-02-21 DIAGNOSIS — Z794 Long term (current) use of insulin: Secondary | ICD-10-CM

## 2021-02-23 ENCOUNTER — Other Ambulatory Visit: Payer: Self-pay | Admitting: Family Medicine

## 2021-02-23 DIAGNOSIS — E119 Type 2 diabetes mellitus without complications: Secondary | ICD-10-CM

## 2021-03-13 ENCOUNTER — Ambulatory Visit: Payer: Medicare Other | Admitting: Family Medicine

## 2021-03-14 ENCOUNTER — Other Ambulatory Visit: Payer: Self-pay | Admitting: Family Medicine

## 2021-03-14 DIAGNOSIS — E119 Type 2 diabetes mellitus without complications: Secondary | ICD-10-CM

## 2021-03-14 DIAGNOSIS — Z794 Long term (current) use of insulin: Secondary | ICD-10-CM

## 2021-03-20 ENCOUNTER — Ambulatory Visit: Payer: Medicare Other | Admitting: Family Medicine

## 2021-03-26 ENCOUNTER — Telehealth: Payer: Self-pay | Admitting: Family Medicine

## 2021-03-26 DIAGNOSIS — E119 Type 2 diabetes mellitus without complications: Secondary | ICD-10-CM

## 2021-03-26 DIAGNOSIS — Z794 Long term (current) use of insulin: Secondary | ICD-10-CM

## 2021-03-26 MED ORDER — NOVOLOG MIX 70/30 FLEXPEN (70-30) 100 UNIT/ML ~~LOC~~ SUPN: PEN_INJECTOR | SUBCUTANEOUS | 1 refills | Status: DC

## 2021-03-26 NOTE — Telephone Encounter (Signed)
Pt was requesting refill for insulin, he could not remember the name. Please advise.   [id number] - [place], [place] - [address] AT [facility] OF [address]  [address], [place] [address]  Phone:  [phone]  Fax:  [phone]

## 2021-03-26 NOTE — Telephone Encounter (Signed)
Rx sent 

## 2021-03-26 NOTE — Telephone Encounter (Signed)
error 

## 2021-03-27 NOTE — Telephone Encounter (Signed)
The nursing home will send over a copy of his recent BS. She stated that on his FL2 is says 16 units but the pharmacy has instructions for 12 units and because they have been giving him 16 units they are running out early. Informed PCP is gone for the day and once received paperwork from Nursing home will put in PCP's folder and take care of this on Monday 03/30/21.

## 2021-03-27 NOTE — Telephone Encounter (Signed)
Ok to change

## 2021-03-27 NOTE — Telephone Encounter (Signed)
Pt's sister states he needs 16 units that last 48 days, twice daily, rx sent in was 12 units. She stated this happened last month as well. She would like to pick up rx today, as he is out of medication. Please advise.   [id number] - [place], [place] - [address] AT [facility] OF [address]  [address], [place] [address]  Phone:  [phone]  Fax:  [phone]

## 2021-03-27 NOTE — Telephone Encounter (Signed)
Mellisa from [facility], is calling to check on the status of the medication. She states if Dr. Nani Ravens needs a copy of the pt's sugars then she can be reached at [phone], since they are on the system.

## 2021-03-30 MED ORDER — NOVOLOG MIX 70/30 FLEXPEN (70-30) 100 UNIT/ML ~~LOC~~ SUPN: PEN_INJECTOR | SUBCUTANEOUS | 1 refills | Status: DC

## 2021-03-30 NOTE — Telephone Encounter (Signed)
PCP did ok to change this.  Sent into pharmacy

## 2021-03-30 NOTE — Addendum Note (Signed)
Addended by: Sharon Seller B on: 03/30/2021 08:07 AM   Modules accepted: Orders

## 2021-04-03 ENCOUNTER — Encounter: Payer: Self-pay | Admitting: Family Medicine

## 2021-04-03 ENCOUNTER — Ambulatory Visit (INDEPENDENT_AMBULATORY_CARE_PROVIDER_SITE_OTHER): Payer: Medicare Other | Admitting: Family Medicine

## 2021-04-03 VITALS — BP 140/78 | HR 68 | Temp 97.0°F | Ht 71.0 in | Wt 204.4 lb

## 2021-04-03 DIAGNOSIS — E1165 Type 2 diabetes mellitus with hyperglycemia: Secondary | ICD-10-CM | POA: Diagnosis not present

## 2021-04-03 DIAGNOSIS — E785 Hyperlipidemia, unspecified: Secondary | ICD-10-CM

## 2021-04-03 DIAGNOSIS — R5381 Other malaise: Secondary | ICD-10-CM | POA: Diagnosis not present

## 2021-04-03 DIAGNOSIS — Z794 Long term (current) use of insulin: Secondary | ICD-10-CM | POA: Diagnosis not present

## 2021-04-03 DIAGNOSIS — B353 Tinea pedis: Secondary | ICD-10-CM

## 2021-04-03 LAB — COMPREHENSIVE METABOLIC PANEL
ALT: 25 U/L (ref 0–53)
AST: 27 U/L (ref 0–37)
Albumin: 4.1 g/dL (ref 3.5–5.2)
Alkaline Phosphatase: 78 U/L (ref 39–117)
BUN: 13 mg/dL (ref 6–23)
CO2: 30 mEq/L (ref 19–32)
Calcium: 9.5 mg/dL (ref 8.4–10.5)
Chloride: 108 mEq/L (ref 96–112)
Creatinine, Ser: 1.1 mg/dL (ref 0.40–1.50)
GFR: 66.98 mL/min (ref >60.00)
Glucose, Bld: 88 mg/dL (ref 70–99)
Potassium: 4.6 mEq/L (ref 3.5–5.1)
Sodium: 141 mEq/L (ref 135–145)
Total Bilirubin: 0.5 mg/dL (ref 0.2–1.2)
Total Protein: 7.2 g/dL (ref 6.0–8.3)

## 2021-04-03 LAB — LIPID PANEL
Cholesterol: 65 mg/dL (ref 0–200)
HDL: 29.5 mg/dL — ABNORMAL LOW (ref >39.00)
LDL Cholesterol: 24 mg/dL (ref 0–99)
NonHDL: 35.59
Total CHOL/HDL Ratio: 2
Triglycerides: 56 mg/dL (ref 0.0–149.0)
VLDL: 11.2 mg/dL (ref 0.0–40.0)

## 2021-04-03 LAB — HEMOGLOBIN A1C: Hgb A1c MFr Bld: 7.2 % — ABNORMAL HIGH (ref 4.6–6.5)

## 2021-04-03 MED ORDER — KETOCONAZOLE 2 % EX CREA: 1.0000 "application " | TOPICAL_CREAM | Freq: Every day | CUTANEOUS | 0 refills | Status: DC

## 2021-04-03 NOTE — Progress Notes (Addendum)
Subjective:   Chief Complaint  Patient presents with   Follow-up    Itching between toes     Phillip Richard is a 71 y.o. male here for follow-up of diabetes.   Phillip Richard's self monitored glucose range is low 100's.  Patient denies hypoglycemic reactions. He checks his glucose levels once per day. Patient does require insulin.  70-30 Novolog 16 u bid  Medications include: Metformin 1000 mg bid,  Diet is OK.  Exercise: walking  Dyslipidemia Patient presents for dyslipidemia follow up. Currently being treated with Lipitor 30 mg/d and compliance with treatment thus far has been good. He denies myalgias. Diet/exercise as above.  The patient is not known to have coexisting coronary artery disease. No Cp or SOB.   Past Medical History:  Diagnosis Date   Allergic rhinitis    Allergy    Arthritis    shoulder   Cataract    both eyes - just watching now   Diabetes mellitus without complication (HCC)    History of chicken pox    History of lead poisoning    Hyperlipidemia    Mild cognitive impairment    Mumps    Uses walker    ambulates with walker     Related testing: Retinal exam: Done Pneumovax: done  Objective:  BP 140/78    Pulse 68    Temp (!) 97 F (36.1 C) (Oral)    Ht 5\' 11"  (1.803 m)    Wt 204 lb 6 oz (92.7 kg)    SpO2 96%    BMI 28.50 kg/m  General:  Well developed, well nourished, in no apparent distress Skin:  macerated tissue between all digits on R foot, 2/3, 3/4, 4/5, digits on L foot.  Head:  Normocephalic, atraumatic Eyes:  Pupils equal and round, sclera anicteric without injection  Lungs:  CTAB, no access msc use Cardio:  RRR, no bruits, no LE edema Musculoskeletal:  Symmetrical muscle groups noted without atrophy or deformity Neuro:  Sensation intact to pinprick on feet Psych: Age appropriate judgment and insight  Assessment:   Type 2 diabetes mellitus with hyperglycemia, with long-term current use of insulin (HCC) - Plan: Comprehensive metabolic  panel, Hemoglobin A1c, Lipid panel, Ambulatory referral to Ophthalmology  Dyslipidemia  Tinea pedis of both feet - Plan: ketoconazole (NIZORAL) 2 % cream  Physical deconditioning - Plan: Ambulatory referral to [place]:   Chronic, stable. Cont Novolog 70-30 16 u bid, Metformin 1000 mg bid. Counseled on diet and exercise. Refer to ophtho again.  Chronic, stable. Ck labs. Cont Lipitor 40 mg/d.  New problem, 6 weeks Ketoconazole qd.  Pt does not exercise routinely. I believe this is affecting his balance/strength negatively as he continues to age and would benefit from [facility] PT given his balance issues.  F/u in 6 mo. The patient voiced understanding and agreement to the plan.  [place], DO 04/06/21 7:55 AM

## 2021-04-03 NOTE — Addendum Note (Signed)
Addended by: Sharon Seller B on: 04/03/2021 11:28 AM   Modules accepted: Orders

## 2021-04-03 NOTE — Patient Instructions (Addendum)
Give [place] 2-3 business days to get the results of your labs back.   Keep the diet clean and stay active.  I recommend getting the updated bivalent covid vaccination booster at your convenience.   If you do not hear anything about your referral in the next 1-2 weeks, call our office and ask for an update.  Let us know if you need anything.

## 2021-04-09 ENCOUNTER — Other Ambulatory Visit: Payer: Self-pay | Admitting: Family Medicine

## 2021-04-09 DIAGNOSIS — U071 COVID-19: Secondary | ICD-10-CM

## 2021-04-20 DIAGNOSIS — E1165 Type 2 diabetes mellitus with hyperglycemia: Secondary | ICD-10-CM | POA: Diagnosis not present

## 2021-04-20 DIAGNOSIS — Z9181 History of falling: Secondary | ICD-10-CM | POA: Diagnosis not present

## 2021-04-20 DIAGNOSIS — M19019 Primary osteoarthritis, unspecified shoulder: Secondary | ICD-10-CM | POA: Diagnosis not present

## 2021-04-20 DIAGNOSIS — J309 Allergic rhinitis, unspecified: Secondary | ICD-10-CM | POA: Diagnosis not present

## 2021-04-20 DIAGNOSIS — Z794 Long term (current) use of insulin: Secondary | ICD-10-CM | POA: Diagnosis not present

## 2021-04-20 DIAGNOSIS — E1136 Type 2 diabetes mellitus with diabetic cataract: Secondary | ICD-10-CM | POA: Diagnosis not present

## 2021-04-20 DIAGNOSIS — Z7984 Long term (current) use of oral hypoglycemic drugs: Secondary | ICD-10-CM | POA: Diagnosis not present

## 2021-04-20 DIAGNOSIS — K589 Irritable bowel syndrome without diarrhea: Secondary | ICD-10-CM | POA: Diagnosis not present

## 2021-04-20 DIAGNOSIS — G3184 Mild cognitive impairment, so stated: Secondary | ICD-10-CM | POA: Diagnosis not present

## 2021-04-20 DIAGNOSIS — Z79899 Other long term (current) drug therapy: Secondary | ICD-10-CM | POA: Diagnosis not present

## 2021-04-20 DIAGNOSIS — E785 Hyperlipidemia, unspecified: Secondary | ICD-10-CM | POA: Diagnosis not present

## 2021-04-21 ENCOUNTER — Telehealth: Payer: Self-pay

## 2021-04-21 NOTE — Telephone Encounter (Signed)
Called left detailed message of PCP verbal ok for request.

## 2021-04-21 NOTE — Telephone Encounter (Signed)
Caller/Agency: Eugenia Pancoast, PT w/[facility] Callback Number: [phone] Requesting OT/PT/Skilled Nursing/Social Work/Speech Therapy: Lia saw patient yesterday and would like to try OTC ankle and foot braces to help pt pick up his feet when he walks because he drags his feet and this will help with hyperextending knees and strengthen pt's quadricep muscles Frequency: 1week1 for 6weeks for walking and strength training

## 2021-05-01 DIAGNOSIS — E1136 Type 2 diabetes mellitus with diabetic cataract: Secondary | ICD-10-CM | POA: Diagnosis not present

## 2021-05-01 DIAGNOSIS — J309 Allergic rhinitis, unspecified: Secondary | ICD-10-CM | POA: Diagnosis not present

## 2021-05-01 DIAGNOSIS — K589 Irritable bowel syndrome without diarrhea: Secondary | ICD-10-CM | POA: Diagnosis not present

## 2021-05-01 DIAGNOSIS — M19019 Primary osteoarthritis, unspecified shoulder: Secondary | ICD-10-CM | POA: Diagnosis not present

## 2021-05-01 DIAGNOSIS — G3184 Mild cognitive impairment, so stated: Secondary | ICD-10-CM | POA: Diagnosis not present

## 2021-05-01 DIAGNOSIS — E1165 Type 2 diabetes mellitus with hyperglycemia: Secondary | ICD-10-CM | POA: Diagnosis not present

## 2021-05-02 ENCOUNTER — Other Ambulatory Visit: Payer: Self-pay | Admitting: Family Medicine

## 2021-05-02 DIAGNOSIS — M79604 Pain in right leg: Secondary | ICD-10-CM

## 2021-05-03 ENCOUNTER — Other Ambulatory Visit: Payer: Self-pay | Admitting: Family Medicine

## 2021-05-03 DIAGNOSIS — E1165 Type 2 diabetes mellitus with hyperglycemia: Secondary | ICD-10-CM

## 2021-05-03 DIAGNOSIS — Z794 Long term (current) use of insulin: Secondary | ICD-10-CM

## 2021-05-08 DIAGNOSIS — J309 Allergic rhinitis, unspecified: Secondary | ICD-10-CM | POA: Diagnosis not present

## 2021-05-08 DIAGNOSIS — K589 Irritable bowel syndrome without diarrhea: Secondary | ICD-10-CM | POA: Diagnosis not present

## 2021-05-08 DIAGNOSIS — M19019 Primary osteoarthritis, unspecified shoulder: Secondary | ICD-10-CM | POA: Diagnosis not present

## 2021-05-08 DIAGNOSIS — E1136 Type 2 diabetes mellitus with diabetic cataract: Secondary | ICD-10-CM | POA: Diagnosis not present

## 2021-05-08 DIAGNOSIS — G3184 Mild cognitive impairment, so stated: Secondary | ICD-10-CM | POA: Diagnosis not present

## 2021-05-08 DIAGNOSIS — E1165 Type 2 diabetes mellitus with hyperglycemia: Secondary | ICD-10-CM | POA: Diagnosis not present

## 2021-05-13 DIAGNOSIS — K589 Irritable bowel syndrome without diarrhea: Secondary | ICD-10-CM | POA: Diagnosis not present

## 2021-05-13 DIAGNOSIS — E1165 Type 2 diabetes mellitus with hyperglycemia: Secondary | ICD-10-CM | POA: Diagnosis not present

## 2021-05-13 DIAGNOSIS — G3184 Mild cognitive impairment, so stated: Secondary | ICD-10-CM | POA: Diagnosis not present

## 2021-05-13 DIAGNOSIS — E1136 Type 2 diabetes mellitus with diabetic cataract: Secondary | ICD-10-CM | POA: Diagnosis not present

## 2021-05-13 DIAGNOSIS — M19019 Primary osteoarthritis, unspecified shoulder: Secondary | ICD-10-CM | POA: Diagnosis not present

## 2021-05-13 DIAGNOSIS — J309 Allergic rhinitis, unspecified: Secondary | ICD-10-CM | POA: Diagnosis not present

## 2021-05-20 DIAGNOSIS — K589 Irritable bowel syndrome without diarrhea: Secondary | ICD-10-CM | POA: Diagnosis not present

## 2021-05-20 DIAGNOSIS — M19019 Primary osteoarthritis, unspecified shoulder: Secondary | ICD-10-CM | POA: Diagnosis not present

## 2021-05-20 DIAGNOSIS — Z79899 Other long term (current) drug therapy: Secondary | ICD-10-CM | POA: Diagnosis not present

## 2021-05-20 DIAGNOSIS — Z794 Long term (current) use of insulin: Secondary | ICD-10-CM | POA: Diagnosis not present

## 2021-05-20 DIAGNOSIS — E1136 Type 2 diabetes mellitus with diabetic cataract: Secondary | ICD-10-CM | POA: Diagnosis not present

## 2021-05-20 DIAGNOSIS — E1165 Type 2 diabetes mellitus with hyperglycemia: Secondary | ICD-10-CM | POA: Diagnosis not present

## 2021-05-20 DIAGNOSIS — Z7984 Long term (current) use of oral hypoglycemic drugs: Secondary | ICD-10-CM | POA: Diagnosis not present

## 2021-05-20 DIAGNOSIS — Z9181 History of falling: Secondary | ICD-10-CM | POA: Diagnosis not present

## 2021-05-20 DIAGNOSIS — J309 Allergic rhinitis, unspecified: Secondary | ICD-10-CM | POA: Diagnosis not present

## 2021-05-20 DIAGNOSIS — E785 Hyperlipidemia, unspecified: Secondary | ICD-10-CM | POA: Diagnosis not present

## 2021-05-20 DIAGNOSIS — G3184 Mild cognitive impairment, so stated: Secondary | ICD-10-CM | POA: Diagnosis not present

## 2021-06-15 ENCOUNTER — Telehealth: Payer: Self-pay | Admitting: Family Medicine

## 2021-06-15 DIAGNOSIS — E1165 Type 2 diabetes mellitus with hyperglycemia: Secondary | ICD-10-CM | POA: Diagnosis not present

## 2021-06-15 DIAGNOSIS — E1136 Type 2 diabetes mellitus with diabetic cataract: Secondary | ICD-10-CM | POA: Diagnosis not present

## 2021-06-15 DIAGNOSIS — G3184 Mild cognitive impairment, so stated: Secondary | ICD-10-CM | POA: Diagnosis not present

## 2021-06-15 DIAGNOSIS — K589 Irritable bowel syndrome without diarrhea: Secondary | ICD-10-CM | POA: Diagnosis not present

## 2021-06-15 DIAGNOSIS — J309 Allergic rhinitis, unspecified: Secondary | ICD-10-CM | POA: Diagnosis not present

## 2021-06-15 DIAGNOSIS — M19019 Primary osteoarthritis, unspecified shoulder: Secondary | ICD-10-CM | POA: Diagnosis not present

## 2021-06-15 NOTE — Telephone Encounter (Signed)
Called HH informed of verbal ok per PCP. 

## 2021-06-15 NOTE — Telephone Encounter (Signed)
Caller/Agency: Cathie Hoops [facility]) ?Callback Number: [phone] ?Requesting OT/PT/Skilled Nursing/Social Work/Speech Therapy: PT ?Frequency: 2 w 3, 1 w 3 ?

## 2021-06-19 DIAGNOSIS — Z794 Long term (current) use of insulin: Secondary | ICD-10-CM | POA: Diagnosis not present

## 2021-06-19 DIAGNOSIS — Z7984 Long term (current) use of oral hypoglycemic drugs: Secondary | ICD-10-CM | POA: Diagnosis not present

## 2021-06-19 DIAGNOSIS — Z9181 History of falling: Secondary | ICD-10-CM | POA: Diagnosis not present

## 2021-06-19 DIAGNOSIS — E1165 Type 2 diabetes mellitus with hyperglycemia: Secondary | ICD-10-CM | POA: Diagnosis not present

## 2021-06-19 DIAGNOSIS — G3184 Mild cognitive impairment, so stated: Secondary | ICD-10-CM | POA: Diagnosis not present

## 2021-06-19 DIAGNOSIS — M19019 Primary osteoarthritis, unspecified shoulder: Secondary | ICD-10-CM | POA: Diagnosis not present

## 2021-06-19 DIAGNOSIS — K589 Irritable bowel syndrome without diarrhea: Secondary | ICD-10-CM | POA: Diagnosis not present

## 2021-06-19 DIAGNOSIS — E1136 Type 2 diabetes mellitus with diabetic cataract: Secondary | ICD-10-CM | POA: Diagnosis not present

## 2021-06-19 DIAGNOSIS — J309 Allergic rhinitis, unspecified: Secondary | ICD-10-CM | POA: Diagnosis not present

## 2021-06-19 DIAGNOSIS — E785 Hyperlipidemia, unspecified: Secondary | ICD-10-CM | POA: Diagnosis not present

## 2021-06-26 DIAGNOSIS — M19019 Primary osteoarthritis, unspecified shoulder: Secondary | ICD-10-CM | POA: Diagnosis not present

## 2021-06-26 DIAGNOSIS — G3184 Mild cognitive impairment, so stated: Secondary | ICD-10-CM | POA: Diagnosis not present

## 2021-06-26 DIAGNOSIS — E1136 Type 2 diabetes mellitus with diabetic cataract: Secondary | ICD-10-CM | POA: Diagnosis not present

## 2021-06-26 DIAGNOSIS — K589 Irritable bowel syndrome without diarrhea: Secondary | ICD-10-CM | POA: Diagnosis not present

## 2021-06-26 DIAGNOSIS — J309 Allergic rhinitis, unspecified: Secondary | ICD-10-CM | POA: Diagnosis not present

## 2021-06-26 DIAGNOSIS — E1165 Type 2 diabetes mellitus with hyperglycemia: Secondary | ICD-10-CM | POA: Diagnosis not present

## 2021-07-01 DIAGNOSIS — G3184 Mild cognitive impairment, so stated: Secondary | ICD-10-CM | POA: Diagnosis not present

## 2021-07-01 DIAGNOSIS — E1136 Type 2 diabetes mellitus with diabetic cataract: Secondary | ICD-10-CM | POA: Diagnosis not present

## 2021-07-01 DIAGNOSIS — E1165 Type 2 diabetes mellitus with hyperglycemia: Secondary | ICD-10-CM | POA: Diagnosis not present

## 2021-07-01 DIAGNOSIS — K589 Irritable bowel syndrome without diarrhea: Secondary | ICD-10-CM | POA: Diagnosis not present

## 2021-07-01 DIAGNOSIS — M19019 Primary osteoarthritis, unspecified shoulder: Secondary | ICD-10-CM | POA: Diagnosis not present

## 2021-07-01 DIAGNOSIS — J309 Allergic rhinitis, unspecified: Secondary | ICD-10-CM | POA: Diagnosis not present

## 2021-07-06 DIAGNOSIS — K589 Irritable bowel syndrome without diarrhea: Secondary | ICD-10-CM | POA: Diagnosis not present

## 2021-07-06 DIAGNOSIS — G3184 Mild cognitive impairment, so stated: Secondary | ICD-10-CM | POA: Diagnosis not present

## 2021-07-06 DIAGNOSIS — E1136 Type 2 diabetes mellitus with diabetic cataract: Secondary | ICD-10-CM | POA: Diagnosis not present

## 2021-07-06 DIAGNOSIS — E1165 Type 2 diabetes mellitus with hyperglycemia: Secondary | ICD-10-CM | POA: Diagnosis not present

## 2021-07-06 DIAGNOSIS — J309 Allergic rhinitis, unspecified: Secondary | ICD-10-CM | POA: Diagnosis not present

## 2021-07-06 DIAGNOSIS — M19019 Primary osteoarthritis, unspecified shoulder: Secondary | ICD-10-CM | POA: Diagnosis not present

## 2021-07-15 DIAGNOSIS — E1165 Type 2 diabetes mellitus with hyperglycemia: Secondary | ICD-10-CM | POA: Diagnosis not present

## 2021-07-15 DIAGNOSIS — K589 Irritable bowel syndrome without diarrhea: Secondary | ICD-10-CM | POA: Diagnosis not present

## 2021-07-15 DIAGNOSIS — G3184 Mild cognitive impairment, so stated: Secondary | ICD-10-CM | POA: Diagnosis not present

## 2021-07-15 DIAGNOSIS — M19019 Primary osteoarthritis, unspecified shoulder: Secondary | ICD-10-CM | POA: Diagnosis not present

## 2021-07-15 DIAGNOSIS — J309 Allergic rhinitis, unspecified: Secondary | ICD-10-CM | POA: Diagnosis not present

## 2021-07-15 DIAGNOSIS — E1136 Type 2 diabetes mellitus with diabetic cataract: Secondary | ICD-10-CM | POA: Diagnosis not present

## 2021-07-19 DIAGNOSIS — Z794 Long term (current) use of insulin: Secondary | ICD-10-CM | POA: Diagnosis not present

## 2021-07-19 DIAGNOSIS — K589 Irritable bowel syndrome without diarrhea: Secondary | ICD-10-CM | POA: Diagnosis not present

## 2021-07-19 DIAGNOSIS — J309 Allergic rhinitis, unspecified: Secondary | ICD-10-CM | POA: Diagnosis not present

## 2021-07-19 DIAGNOSIS — Z7984 Long term (current) use of oral hypoglycemic drugs: Secondary | ICD-10-CM | POA: Diagnosis not present

## 2021-07-19 DIAGNOSIS — E785 Hyperlipidemia, unspecified: Secondary | ICD-10-CM | POA: Diagnosis not present

## 2021-07-19 DIAGNOSIS — E1136 Type 2 diabetes mellitus with diabetic cataract: Secondary | ICD-10-CM | POA: Diagnosis not present

## 2021-07-19 DIAGNOSIS — M19019 Primary osteoarthritis, unspecified shoulder: Secondary | ICD-10-CM | POA: Diagnosis not present

## 2021-07-19 DIAGNOSIS — E1165 Type 2 diabetes mellitus with hyperglycemia: Secondary | ICD-10-CM | POA: Diagnosis not present

## 2021-07-19 DIAGNOSIS — Z9181 History of falling: Secondary | ICD-10-CM | POA: Diagnosis not present

## 2021-07-19 DIAGNOSIS — G3184 Mild cognitive impairment, so stated: Secondary | ICD-10-CM | POA: Diagnosis not present

## 2021-07-20 ENCOUNTER — Other Ambulatory Visit: Payer: Self-pay | Admitting: Family Medicine

## 2021-07-20 DIAGNOSIS — U071 COVID-19: Secondary | ICD-10-CM

## 2021-07-22 DIAGNOSIS — Z23 Encounter for immunization: Secondary | ICD-10-CM | POA: Diagnosis not present

## 2021-07-22 DIAGNOSIS — G3184 Mild cognitive impairment, so stated: Secondary | ICD-10-CM | POA: Diagnosis not present

## 2021-07-22 DIAGNOSIS — M19019 Primary osteoarthritis, unspecified shoulder: Secondary | ICD-10-CM | POA: Diagnosis not present

## 2021-07-22 DIAGNOSIS — E1136 Type 2 diabetes mellitus with diabetic cataract: Secondary | ICD-10-CM | POA: Diagnosis not present

## 2021-07-22 DIAGNOSIS — J309 Allergic rhinitis, unspecified: Secondary | ICD-10-CM | POA: Diagnosis not present

## 2021-07-22 DIAGNOSIS — E1165 Type 2 diabetes mellitus with hyperglycemia: Secondary | ICD-10-CM | POA: Diagnosis not present

## 2021-07-22 DIAGNOSIS — K589 Irritable bowel syndrome without diarrhea: Secondary | ICD-10-CM | POA: Diagnosis not present

## 2021-07-24 ENCOUNTER — Telehealth: Payer: Self-pay | Admitting: Family Medicine

## 2021-07-24 NOTE — Telephone Encounter (Signed)
RN Esau Grew from [facility] [phone]) Called to report the patient had a Covid booster yesterday at [facility]. The RN used a Blood barrier for the vaccine.  It was cleaned well, but today on either side of the white circle where barrier was placed he has 2 little blisters on each side. No other symptoms, no SOB, rash, itching.  Arm is a little sore, but is norma They think blisters will go down and have instructed the patient not to scratch them. RN wanted to report this to PCP.

## 2021-07-24 NOTE — Telephone Encounter (Signed)
Noted  

## 2021-08-07 DIAGNOSIS — K589 Irritable bowel syndrome without diarrhea: Secondary | ICD-10-CM | POA: Diagnosis not present

## 2021-08-07 DIAGNOSIS — M19019 Primary osteoarthritis, unspecified shoulder: Secondary | ICD-10-CM | POA: Diagnosis not present

## 2021-08-07 DIAGNOSIS — E1136 Type 2 diabetes mellitus with diabetic cataract: Secondary | ICD-10-CM | POA: Diagnosis not present

## 2021-08-07 DIAGNOSIS — G3184 Mild cognitive impairment, so stated: Secondary | ICD-10-CM | POA: Diagnosis not present

## 2021-08-07 DIAGNOSIS — E1165 Type 2 diabetes mellitus with hyperglycemia: Secondary | ICD-10-CM | POA: Diagnosis not present

## 2021-08-07 DIAGNOSIS — J309 Allergic rhinitis, unspecified: Secondary | ICD-10-CM | POA: Diagnosis not present

## 2021-08-08 DIAGNOSIS — R2681 Unsteadiness on feet: Secondary | ICD-10-CM | POA: Diagnosis not present

## 2021-08-08 DIAGNOSIS — R200 Anesthesia of skin: Secondary | ICD-10-CM | POA: Diagnosis not present

## 2021-08-08 DIAGNOSIS — E119 Type 2 diabetes mellitus without complications: Secondary | ICD-10-CM | POA: Diagnosis not present

## 2021-08-08 DIAGNOSIS — I10 Essential (primary) hypertension: Secondary | ICD-10-CM | POA: Diagnosis not present

## 2021-08-08 DIAGNOSIS — I6782 Cerebral ischemia: Secondary | ICD-10-CM | POA: Diagnosis not present

## 2021-08-08 DIAGNOSIS — R2689 Other abnormalities of gait and mobility: Secondary | ICD-10-CM | POA: Diagnosis not present

## 2021-08-08 DIAGNOSIS — Z7984 Long term (current) use of oral hypoglycemic drugs: Secondary | ICD-10-CM | POA: Diagnosis not present

## 2021-08-08 DIAGNOSIS — Z79899 Other long term (current) drug therapy: Secondary | ICD-10-CM | POA: Diagnosis not present

## 2021-08-08 DIAGNOSIS — E538 Deficiency of other specified B group vitamins: Secondary | ICD-10-CM | POA: Diagnosis not present

## 2021-08-08 DIAGNOSIS — G319 Degenerative disease of nervous system, unspecified: Secondary | ICD-10-CM | POA: Diagnosis not present

## 2021-08-08 DIAGNOSIS — R531 Weakness: Secondary | ICD-10-CM | POA: Diagnosis not present

## 2021-08-08 DIAGNOSIS — I454 Nonspecific intraventricular block: Secondary | ICD-10-CM | POA: Diagnosis not present

## 2021-08-08 DIAGNOSIS — D329 Benign neoplasm of meninges, unspecified: Secondary | ICD-10-CM | POA: Diagnosis not present

## 2021-08-09 DIAGNOSIS — E785 Hyperlipidemia, unspecified: Secondary | ICD-10-CM | POA: Diagnosis not present

## 2021-08-09 DIAGNOSIS — E119 Type 2 diabetes mellitus without complications: Secondary | ICD-10-CM | POA: Diagnosis not present

## 2021-08-09 DIAGNOSIS — Z794 Long term (current) use of insulin: Secondary | ICD-10-CM | POA: Diagnosis not present

## 2021-08-09 DIAGNOSIS — R2681 Unsteadiness on feet: Secondary | ICD-10-CM | POA: Diagnosis not present

## 2021-08-09 DIAGNOSIS — R531 Weakness: Secondary | ICD-10-CM | POA: Diagnosis not present

## 2021-08-09 DIAGNOSIS — I10 Essential (primary) hypertension: Secondary | ICD-10-CM | POA: Diagnosis not present

## 2021-08-10 ENCOUNTER — Telehealth: Payer: Self-pay

## 2021-08-10 DIAGNOSIS — I10 Essential (primary) hypertension: Secondary | ICD-10-CM | POA: Diagnosis not present

## 2021-08-10 DIAGNOSIS — I454 Nonspecific intraventricular block: Secondary | ICD-10-CM | POA: Diagnosis not present

## 2021-08-10 DIAGNOSIS — E538 Deficiency of other specified B group vitamins: Secondary | ICD-10-CM | POA: Diagnosis not present

## 2021-08-10 DIAGNOSIS — R29818 Other symptoms and signs involving the nervous system: Secondary | ICD-10-CM | POA: Diagnosis not present

## 2021-08-10 NOTE — Telephone Encounter (Signed)
Spoke to the patients sister and patient has been in the hospital all weekend BS has also been elevated BP leaving facility to the hospital 186/100.  As of today the patient is doing better and the sister hopes he can come home today. She will make hospital followup appt with PCP once he is discharged.

## 2021-08-10 NOTE — Telephone Encounter (Signed)
Initial Comment Caller states her brother lives at [facility] and the facility called her with his blood pressure readings of 106/100 this morning and then this evening it was 180/100. He does take blood pressure medication. No other symptoms. Translation No No Triage Reason Other Nurse Assessment Nurse: Dawna Part, RN, Jarrett Soho Date/Time Eilene Ghazi Time): 08/08/2021 7:21:26 PM Confirm and document reason for call. If symptomatic, describe symptoms. ---Caller reports patient lives in assisted living. Elevated blood pressure. Facility recommends patient be sent to ED for evaluations. Caller wanted second opinion. Reports due to length of call back time she has already advised Assisted Living facility to send patient to ED. Does the patient have any new or worsening symptoms? ---Yes Will a triage be completed? ---No Select reason for no triage. ---Other Disp. Time Eilene Ghazi Time) Disposition Final User 08/08/2021 7:26:31 PM Clinical Call Yes Dawna Part, RN, Jarrett Soho

## 2021-08-11 DIAGNOSIS — I10 Essential (primary) hypertension: Secondary | ICD-10-CM | POA: Diagnosis not present

## 2021-08-11 DIAGNOSIS — I517 Cardiomegaly: Secondary | ICD-10-CM | POA: Diagnosis not present

## 2021-08-11 DIAGNOSIS — E530 Riboflavin deficiency: Secondary | ICD-10-CM | POA: Diagnosis not present

## 2021-08-11 DIAGNOSIS — D329 Benign neoplasm of meninges, unspecified: Secondary | ICD-10-CM | POA: Diagnosis not present

## 2021-08-17 DIAGNOSIS — E1136 Type 2 diabetes mellitus with diabetic cataract: Secondary | ICD-10-CM | POA: Diagnosis not present

## 2021-08-17 DIAGNOSIS — G3184 Mild cognitive impairment, so stated: Secondary | ICD-10-CM | POA: Diagnosis not present

## 2021-08-17 DIAGNOSIS — J309 Allergic rhinitis, unspecified: Secondary | ICD-10-CM | POA: Diagnosis not present

## 2021-08-17 DIAGNOSIS — K589 Irritable bowel syndrome without diarrhea: Secondary | ICD-10-CM | POA: Diagnosis not present

## 2021-08-17 DIAGNOSIS — M19019 Primary osteoarthritis, unspecified shoulder: Secondary | ICD-10-CM | POA: Diagnosis not present

## 2021-08-17 DIAGNOSIS — E1165 Type 2 diabetes mellitus with hyperglycemia: Secondary | ICD-10-CM | POA: Diagnosis not present

## 2021-08-19 ENCOUNTER — Telehealth: Payer: Self-pay | Admitting: Family Medicine

## 2021-08-19 NOTE — Telephone Encounter (Signed)
Lorriane Shire (sister DPR OK) called to schedule pt for a HFU appt with Dr. Nani Ravens after being discharged on 6.13.23. After reviewing schedule, planned to schedule pt on 6.30.23 @ 11:30 and advised Lorriane Shire that we would put him down for that and call if there were any issues. After reviewing with Anderson Malta, realized that appt is out of the 2 week time frame for HFUs. LVM with Lorriane Shire to call back to schedule HFU with Clarita Leber or Mickel Baas within 2 week time frame.

## 2021-08-31 ENCOUNTER — Ambulatory Visit (INDEPENDENT_AMBULATORY_CARE_PROVIDER_SITE_OTHER): Payer: Medicare Other

## 2021-08-31 ENCOUNTER — Ambulatory Visit (INDEPENDENT_AMBULATORY_CARE_PROVIDER_SITE_OTHER): Payer: Medicare Other | Admitting: Family Medicine

## 2021-08-31 ENCOUNTER — Telehealth (HOSPITAL_BASED_OUTPATIENT_CLINIC_OR_DEPARTMENT_OTHER): Payer: Self-pay

## 2021-08-31 ENCOUNTER — Encounter: Payer: Self-pay | Admitting: Family Medicine

## 2021-08-31 VITALS — BP 130/84 | HR 75 | Temp 98.0°F | Ht 71.0 in | Wt 206.5 lb

## 2021-08-31 DIAGNOSIS — D519 Vitamin B12 deficiency anemia, unspecified: Secondary | ICD-10-CM | POA: Diagnosis not present

## 2021-08-31 DIAGNOSIS — Z794 Long term (current) use of insulin: Secondary | ICD-10-CM | POA: Diagnosis not present

## 2021-08-31 DIAGNOSIS — D329 Benign neoplasm of meninges, unspecified: Secondary | ICD-10-CM | POA: Diagnosis not present

## 2021-08-31 DIAGNOSIS — E1165 Type 2 diabetes mellitus with hyperglycemia: Secondary | ICD-10-CM

## 2021-08-31 DIAGNOSIS — R5381 Other malaise: Secondary | ICD-10-CM | POA: Diagnosis not present

## 2021-08-31 DIAGNOSIS — Z23 Encounter for immunization: Secondary | ICD-10-CM

## 2021-08-31 DIAGNOSIS — Z0000 Encounter for general adult medical examination without abnormal findings: Secondary | ICD-10-CM

## 2021-08-31 LAB — MICROALBUMIN / CREATININE URINE RATIO
Creatinine,U: 133 mg/dL
Microalb Creat Ratio: 4.6 mg/g (ref 0.0–30.0)
Microalb, Ur: 6.1 mg/dL — ABNORMAL HIGH (ref 0.0–1.9)

## 2021-08-31 LAB — CBC
HCT: 42.9 % (ref 39.0–52.0)
Hemoglobin: 13.7 g/dL (ref 13.0–17.0)
MCHC: 32 g/dL (ref 30.0–36.0)
MCV: 84.2 fl (ref 78.0–100.0)
Platelets: 191 10*3/uL (ref 150.0–400.0)
RBC: 5.1 Mil/uL (ref 4.22–5.81)
RDW: 15 % (ref 11.5–15.5)
WBC: 9.9 10*3/uL (ref 4.0–10.5)

## 2021-08-31 LAB — VITAMIN B12: Vitamin B-12: 1138 pg/mL — ABNORMAL HIGH (ref 211–911)

## 2021-08-31 MED ORDER — LISINOPRIL 10 MG PO TABS: 10.0000 mg | ORAL_TABLET | Freq: Every day | ORAL | 3 refills | Status: DC

## 2021-08-31 MED ORDER — AMLODIPINE BESYLATE 5 MG PO TABS: 5.0000 mg | ORAL_TABLET | Freq: Every day | ORAL | 3 refills | Status: DC

## 2021-08-31 NOTE — Patient Instructions (Signed)
[name] , Thank you for taking time to come for your Medicare Wellness Visit. I appreciate your ongoing commitment to your health goals. Please review the following plan we discussed and let me know if I can assist you in the future.   Screening recommendations/referrals: Colonoscopy: 03/30/18 due 03/31/23 Recommended yearly ophthalmology/optometry visit for glaucoma screening and checkup Recommended yearly dental visit for hygiene and checkup  Vaccinations: Influenza vaccine: up to date Pneumococcal vaccine: up to date Tdap vaccine: up to date Shingles vaccine: up to date   Covid-19: completed  Advanced directives: yes, not on file  Conditions/risks identified: see problem list   Next appointment: Follow up in one year for your annual wellness visit.   Preventive Care 23 Years and Older, Male Preventive care refers to lifestyle choices and visits with your health care provider that can promote health and wellness. What does preventive care include? A yearly physical exam. This is also called an annual well check. Dental exams once or twice a year. Routine eye exams. Ask your health care provider how often you should have your eyes checked. Personal lifestyle choices, including: Daily care of your teeth and gums. Regular physical activity. Eating a healthy diet. Avoiding tobacco and drug use. Limiting alcohol use. Practicing safe sex. Taking low doses of aspirin every day. Taking vitamin and mineral supplements as recommended by your health care provider. What happens during an annual well check? The services and screenings done by your health care provider during your annual well check will depend on your age, overall health, lifestyle risk factors, and family history of disease. Counseling  Your health care provider may ask you questions about your: Alcohol use. Tobacco use. Drug use. Emotional well-being. Home and relationship well-being. Sexual activity. Eating  habits. History of falls. Memory and ability to understand (cognition). Work and work Statistician. Screening  You may have the following tests or measurements: Height, weight, and BMI. Blood pressure. Lipid and cholesterol levels. These may be checked every 5 years, or more frequently if you are over 21 years old. Skin check. Lung cancer screening. You may have this screening every year starting at age 61 if you have a 30-pack-year history of smoking and currently smoke or have quit within the past 15 years. Fecal occult blood test (FOBT) of the stool. You may have this test every year starting at age 38. Flexible sigmoidoscopy or colonoscopy. You may have a sigmoidoscopy every 5 years or a colonoscopy every 10 years starting at age 70. Prostate cancer screening. Recommendations will vary depending on your family history and other risks. Hepatitis C blood test. Hepatitis B blood test. Sexually transmitted disease (STD) testing. Diabetes screening. This is done by checking your blood sugar (glucose) after you have not eaten for a while (fasting). You may have this done every 1-3 years. Abdominal aortic aneurysm (AAA) screening. You may need this if you are a current or former smoker. Osteoporosis. You may be screened starting at age 61 if you are at high risk. Talk with your health care provider about your test results, treatment options, and if necessary, the need for more tests. Vaccines  Your health care provider may recommend certain vaccines, such as: Influenza vaccine. This is recommended every year. Tetanus, diphtheria, and acellular pertussis (Tdap, Td) vaccine. You may need a Td booster every 10 years. Zoster vaccine. You may need this after age 78. Pneumococcal 13-valent conjugate (PCV13) vaccine. One dose is recommended after age 28. Pneumococcal polysaccharide (PPSV23) vaccine. One dose is  recommended after age 70. Talk to your health care provider about which screenings and  vaccines you need and how often you need them. This information is not intended to replace advice given to you by your health care provider. Make sure you discuss any questions you have with your health care provider. Document Released: 03/14/2015 Document Revised: 11/05/2015 Document Reviewed: 12/17/2014 Elsevier Interactive Patient Education  2017 [employer] Prevention in the Home Falls can cause injuries. They can happen to people of all ages. There are many things you can do to make your home safe and to help prevent falls. What can I do on the outside of my home? Regularly fix the edges of walkways and driveways and fix any cracks. Remove anything that might make you trip as you walk through a door, such as a raised step or threshold. Trim any bushes or trees on the path to your home. Use bright outdoor lighting. Clear any walking paths of anything that might make someone trip, such as rocks or tools. Regularly check to see if handrails are loose or broken. Make sure that both sides of any steps have handrails. Any raised decks and porches should have guardrails on the edges. Have any leaves, snow, or ice cleared regularly. Use sand or salt on walking paths during winter. Clean up any spills in your garage right away. This includes oil or grease spills. What can I do in the bathroom? Use night lights. Install grab bars by the toilet and in the tub and shower. Do not use towel bars as grab bars. Use non-skid mats or decals in the tub or shower. If you need to sit down in the shower, use a plastic, non-slip stool. Keep the floor dry. Clean up any water that spills on the floor as soon as it happens. Remove soap buildup in the tub or shower regularly. Attach bath mats securely with double-sided non-slip rug tape. Do not have throw rugs and other things on the floor that can make you trip. What can I do in the bedroom? Use night lights. Make sure that you have a light by your  bed that is easy to reach. Do not use any sheets or blankets that are too big for your bed. They should not hang down onto the floor. Have a firm chair that has side arms. You can use this for support while you get dressed. Do not have throw rugs and other things on the floor that can make you trip. What can I do in the kitchen? Clean up any spills right away. Avoid walking on wet floors. Keep items that you use a lot in easy-to-reach places. If you need to reach something above you, use a strong step stool that has a grab bar. Keep electrical cords out of the way. Do not use floor polish or wax that makes floors slippery. If you must use wax, use non-skid floor wax. Do not have throw rugs and other things on the floor that can make you trip. What can I do with my stairs? Do not leave any items on the stairs. Make sure that there are handrails on both sides of the stairs and use them. Fix handrails that are broken or loose. Make sure that handrails are as long as the stairways. Check any carpeting to make sure that it is firmly attached to the stairs. Fix any carpet that is loose or worn. Avoid having throw rugs at the top or bottom of the stairs. If  you do have throw rugs, attach them to the floor with carpet tape. Make sure that you have a light switch at the top of the stairs and the bottom of the stairs. If you do not have them, ask someone to add them for you. What else can I do to help prevent falls? Wear shoes that: Do not have high heels. Have rubber bottoms. Are comfortable and fit you well. Are closed at the toe. Do not wear sandals. If you use a stepladder: Make sure that it is fully opened. Do not climb a closed stepladder. Make sure that both sides of the stepladder are locked into place. Ask someone to hold it for you, if possible. Clearly mark and make sure that you can see: Any grab bars or handrails. First and last steps. Where the edge of each step is. Use tools that  help you move around (mobility aids) if they are needed. These include: Canes. Walkers. Scooters. Crutches. Turn on the lights when you go into a dark area. Replace any light bulbs as soon as they burn out. Set up your furniture so you have a clear path. Avoid moving your furniture around. If any of your floors are uneven, fix them. If there are any pets around you, be aware of where they are. Review your medicines with your doctor. Some medicines can make you feel dizzy. This can increase your chance of falling. Ask your doctor what other things that you can do to help prevent falls. This information is not intended to replace advice given to you by your health care provider. Make sure you discuss any questions you have with your health care provider. Document Released: 12/12/2008 Document Revised: 07/24/2015 Document Reviewed: 03/22/2014 Elsevier Interactive Patient Education  2017 [employer].

## 2021-08-31 NOTE — Progress Notes (Signed)
Chief Complaint  Patient presents with   Hospitalization Follow-up    Hypertension Referral to Neurology in [facility] system     Subjective: Patient is a 71 y.o. male here for.  He is here with his sister who helps provide the history.  The patient was admitted to [facility] on 6/10 and discharged on 6/13 for left-sided weakness.  He also had extremely high blood pressure and was started on amlodipine 5 mg daily and lisinopril 10 mg daily.  His weakness resolved within 2 hours of onset and he denies any lingering paresthesias, balance issues unrelated to weakness, asymmetric weakness, difficulty swallowing, or trouble with speech.  He is on Lipitor and they started him on baby aspirin as well.  MRI did show chronic vessel changes but no acute stroke.  There is a potential old stroke in the lacunar region.  There is also a 2.1 cm meningioma incidentally found.  3 to 8-monthfollow-up with repeat MRI recommended.  His blood pressure has been stable since starting the medication.  He reports compliance and no adverse effects.  He is not physically exert himself very much due to continued weakness.  His diet is fair but is largely dependent on what his assisted living facility provides.  Past Medical History:  Diagnosis Date   Allergic rhinitis    Allergy    Arthritis    shoulder   Cataract    both eyes - just watching now   Diabetes mellitus without complication (HCC)    History of chicken pox    History of lead poisoning    Hyperlipidemia    Mild cognitive impairment    Mumps    Uses walker    ambulates with walker    Objective: BP 130/84   Pulse 75   Temp 98 F (36.7 C) (Oral)   Ht '5\' 11"'$  (1.803 m)   Wt 206 lb 8 oz (93.7 kg)   SpO2 99%   BMI 28.80 kg/m  General: Awake, appears stated age Heart: RRR, no LE edema Lungs: CTAB, no rales, wheezes or rhonchi. No accessory muscle use Neuro: DTRs equal and symmetric throughout, no clonus, no cerebellar signs, 5/5  strength throughout, gait is slow and cautious HEENT: MMM, uvula rises equally upon phonation Psych: normal affect and mood  Assessment and Plan: Type 2 diabetes mellitus with hyperglycemia, with long-term current use of insulin (HSan Leanna - Plan: Microalbumin / creatinine urine ratio  Anemia due to vitamin B12 deficiency, unspecified B12 deficiency type - Plan: CBC, B12  Meningioma (HCC) - Plan: MR Brain W Wo Contrast, CANCELED: MR Brain W Wo Contrast  Physical deconditioning - Plan: Ambulatory referral to Physical Therapy  Reviewed hyperglycemia from hospitalization, A1c was 7.4. Home readings within range for him. No changes for now. Ck urine.  Follow up on above lab abnormalities. May need to take supp. Will sched f/u MRI to ensure stability in 3 mo.  Refer PT thru his ALF, he reports his current set up is for monthly which is not enough. The strength he lost from his hospitalization has not returned as of yet.  The patient and his sister voiced understanding and agreement to the plan.  I spent 45 min w the patient and his sister discussing the above plans in addition to reviewing both his chart and hospitalization records on the same day of the visit.   NSummit DO 08/31/21  11:53 AM

## 2021-08-31 NOTE — Progress Notes (Signed)
Subjective:   Phillip Richard is a 71 y.o. male who presents for Medicare Annual/Subsequent preventive examination.  Review of Systems     Cardiac Risk Factors include: male gender;advanced age (>19mn, >>41women);diabetes mellitus;dyslipidemia     Objective:    There were no vitals filed for this visit. There is no height or weight on file to calculate BMI.     08/31/2021   11:55 AM 08/04/2016    3:23 PM 01/04/2015    8:12 AM  Advanced Directives  Does Patient Have a Medical Advance Directive? Yes No No  Type of [place]amed[place] of facility DNR (pink MOST or yellow form);Living will    Does patient want to make changes to medical advance directive? No - Patient declined    Copy of [place]? No - copy requested    Would patient like information on creating a medical advance directive? No - Patient declined Yes (MAU/Ambulatory/Procedural Areas - Information given) No - patient declined information    Current Medications (verified) Outpatient Encounter Medications as of 08/31/2021  Medication Sig   amLODipine (NORVASC) 5 MG tablet Take 1 tablet (5 mg total) by mouth daily.   atorvastatin (LIPITOR) 40 MG tablet TAKE 1 TABLET(40 MG) BY MOUTH DAILY   fluticasone (FLONASE) 50 MCG/ACT nasal spray SHAKE LIQUID AND USE 2 SPRAYS IN EACH NOSTRIL DAILY   glucose blood test strip 1 each by Other route as needed for other. Use as instructed   insulin aspart protamine - aspart (NOVOLOG MIX 70/30 FLEXPEN) (70-30) 100 UNIT/ML FlexPen ADMINISTER 16 UNITS UNDER THE SKIN TWICE DAILY   lisinopril (ZESTRIL) 10 MG tablet Take 1 tablet (10 mg total) by mouth daily.   loratadine (CLARITIN) 10 MG tablet Take 1 tablet (10 mg total) by mouth daily. (Patient taking differently: Take 10 mg by mouth daily as needed.)   metFORMIN (GLUCOPHAGE) 500 MG tablet TAKE 2 TABLETS(1000 MG) BY MOUTH TWICE DAILY WITH A MEAL   naproxen (NAPROSYN) 375 MG tablet TAKE  1 TABLET(375 MG) BY MOUTH TWICE DAILY AS NEEDED FOR PAIN   NOVOFINE PLUS 32G X 4 MM MISC USE WITH NOVOLOG FLEXPEN AS DIRECTED   Olopatadine HCl (PATADAY) 0.2 % SOLN Apply 1 drop to eye as needed (eye irritation). 1 drop to each eye   Zoster Vaccine Adjuvanted ([facility] injection Inject into the muscle.   No facility-administered encounter medications on file as of 08/31/2021.    Allergies (verified) Patient has no known allergies.   History: Past Medical History:  Diagnosis Date   Allergic rhinitis    Allergy    Arthritis    shoulder   Cataract    both eyes - just watching now   Diabetes mellitus without complication (HCC)    History of chicken pox    History of lead poisoning    Hyperlipidemia    Mild cognitive impairment    Mumps    Uses walker    ambulates with walker   Past Surgical History:  Procedure Laterality Date   HERNIA REPAIR     Umbilical & Inguinal   WISDOM TOOTH EXTRACTION     Family History  Problem Relation Age of Onset   Diabetes Mother    Other Father        Unknown   Rectal cancer Sister    Thyroid disease Sister    Leukemia Brother    Cancer Brother        Other   Diabetes  Brother    Diabetes Maternal Aunt    Diabetes Maternal Uncle    Healthy Son        x1   Healthy Daughter        x5   Stomach cancer Neg Hx    Colon polyps Neg Hx    Colon cancer Neg Hx    Social History   Socioeconomic History   Marital status: Widowed    Spouse name: Not on file   Number of children: Not on file   Years of education: Not on file   Highest education level: Not on file  Occupational History   Not on file  Tobacco Use   Smoking status: Former    Packs/day: 0.25    Years: 50.00    Total pack years: 12.50    Types: Cigarettes, Cigars    Start date: 59    Quit date: 08/2017    Years since quitting: 4.0   Smokeless tobacco: Never  Vaping Use   Vaping Use: Never used  Substance and Sexual Activity   Alcohol use: Not Currently     Alcohol/week: 2.0 standard drinks of alcohol    Types: 2 Cans of beer per week    Comment: occasionally - none since 08/2017   Drug use: Not Currently    Types: "Crack" cocaine    Comment: For pain  Patient states "last use years ago"   Sexual activity: Not Currently  Other Topics Concern   Not on file  Social History Narrative   Not on file   Social Determinants of Health   Financial Resource Strain: Not on file  Food Insecurity: Not on file  Transportation Needs: Not on file  Physical Activity: Not on file  Stress: Not on file  Social Connections: Not on file    Tobacco Counseling Counseling given: Not Answered   Clinical Intake:  Pre-visit preparation completed: Yes  Pain : No/denies pain     BMI - recorded: 28.8 Nutritional Status: BMI 25 -29 Overweight Nutritional Risks: None Diabetes: Yes CBG done?: No Did pt. bring in CBG monitor from home?: No  How often do you need to have someone help you when you read instructions, pamphlets, or other written materials from your doctor or pharmacy?: 1 - Never  Diabetic?yes Nutrition Risk Assessment:  Has the patient had any N/V/D within the last 2 months?  No  Does the patient have any non-healing wounds?  No  Has the patient had any unintentional weight loss or weight gain?  No   Diabetes:  Is the patient diabetic?  Yes  If diabetic, was a CBG obtained today?  No  Did the patient bring in their glucometer from home?  No  How often do you monitor your CBG's? Twice daily.   Financial Strains and Diabetes Management:  Are you having any financial strains with the device, your supplies or your medication? No .  Does the patient want to be seen by Chronic Care Management for management of their diabetes?  No  Would the patient like to be referred to a Nutritionist or for Diabetic Management?  No   Diabetic Exams:  Diabetic Eye Exam: Overdue for diabetic eye exam. Pt has been advised about the importance in  completing this exam. Patient advised to call and schedule an eye exam. Diabetic Foot Exam: Completed 04/03/21    Interpreter Needed?: No  Information entered by :: Lenor Provencher   Activities of Daily Living    08/31/2021   12:00  PM  In your present state of health, do you have any difficulty performing the following activities:  Hearing? 0  Vision? 0  Difficulty concentrating or making decisions? 0  Walking or climbing stairs? 1  Dressing or bathing? 0  Doing errands, shopping? 1  Preparing Food and eating ? N  Using the Toilet? N  In the past six months, have you accidently leaked urine? Y  Do you have problems with loss of bowel control? Y  Managing your Medications? Y  Managing your Finances? Y  Housekeeping or managing your Housekeeping? Y    Patient Care Team: Shelda Pal, DO as PCP - General (Family Medicine)  Indicate any recent Medical Services you may have received from other than [facility] providers in the past year (date may be approximate).     Assessment:   This is a routine wellness examination for [place].  Hearing/Vision screen No results found.  Dietary issues and exercise activities discussed: Current Exercise Habits: Home exercise routine, Time (Minutes): 30, Frequency (Times/Week): 7, Weekly Exercise (Minutes/Week): 210, Intensity: Mild, Exercise limited by: None identified   Goals Addressed   None    Depression Screen    08/31/2021   11:57 AM 08/31/2021   11:20 AM 06/17/2020    9:27 AM 08/04/2016    3:24 PM 08/04/2016    2:46 PM  PHQ 2/9 Scores  PHQ - 2 Score 0 0 0 0 0  PHQ- 9 Score  0   0    Fall Risk    08/31/2021   11:56 AM 08/31/2021   11:21 AM 05/06/2020    9:50 AM 01/24/2019    9:52 AM 01/13/2018    4:14 PM  Fall Risk   Falls in the past year? 0 0 0 0 0  Comment    [employer] Telephone Survey: data to providers prior to load [employer] Survey: data to providers prior to load  Number falls in past yr: 0 0 0    Injury with Fall? 0 0  0    Risk for fall due to : No Fall Risks No Fall Risks     Follow up Falls evaluation completed Falls evaluation completed       [place]:  Any stairs in or around the home? No  If so, are there any without handrails?  N/A Home free of loose throw rugs in walkways, pet beds, electrical cords, etc? Yes  Adequate lighting in your home to reduce risk of falls? Yes   ASSISTIVE DEVICES UTILIZED TO PREVENT FALLS:  Life alert? No  Use of a cane, walker or w/c? Yes  Grab bars in the bathroom? Yes  Shower chair or bench in shower? Yes  Elevated toilet seat or a handicapped toilet? Yes   TIMED UP AND GO:  Was the test performed? Yes .  Length of time to ambulate 10 feet: 20 sec.   Gait slow and steady with assistive device  Cognitive Function:    08/04/2016    3:25 PM  MMSE - Mini Mental State Exam  Orientation to time 5  Orientation to Place 4  Registration 3  Attention/ Calculation 5  Recall 3  Language- name 2 objects 2  Language- repeat 0  Language- follow 3 step command 3  Language- read & follow direction 1  Write a sentence 1  Copy design 1  Total score 28        08/31/2021   12:05 PM  6CIT Screen  What Year? 0 points  What month? 0 points  What time? 0 points  Count back from 20 0 points  Months in reverse 4 points  Repeat phrase 8 points  Total Score 12 points    Immunizations Immunization History  Administered Date(s) Administered   Fluad Quad(high Dose 65+) 11/15/2018, 12/17/2019, 12/12/2020   Influenza, High Dose Seasonal PF 11/18/2016, 12/16/2017   Influenza-Unspecified 03/02/2015   Moderna SARS-COV2 Booster Vaccination 07/11/2020   Moderna Sars-Covid-2 Vaccination 03/21/2019, 04/12/2019, 01/11/2020   PNEUMOCOCCAL CONJUGATE-20 08/31/2021   PPD Test 11/29/2017   Pfizer Covid-19 Vaccine Bivalent Booster 59yr & up 07/02/2021   Pneumococcal Conjugate-13 11/18/2016   Pneumococcal Polysaccharide-23 11/11/2009,  12/16/2017   Tdap 06/13/2017   Zoster Recombinat (Shingrix) 12/12/2020    TDAP status: Up to date  Flu Vaccine status: Up to date  Pneumococcal vaccine status: Up to date  Covid-19 vaccine status: Completed vaccines  Qualifies for Shingles Vaccine? Yes   Zostavax completed No   Shingrix Completed?: Yes  Screening Tests Health Maintenance  Topic Date Due   INFLUENZA VACCINE  09/29/2021   HEMOGLOBIN A1C  10/01/2021   OPHTHALMOLOGY EXAM  10/13/2021   COVID-19 Vaccine (5 - Moderna series) 11/02/2021   FOOT EXAM  04/03/2022   COLONOSCOPY (Pts 45-485yrInsurance coverage will need to be confirmed)  03/31/2023   TETANUS/TDAP  06/14/2027   Pneumonia Vaccine 71Years old  Completed   Hepatitis C Screening  Completed   HPV VACCINES  Aged Out   Zoster Vaccines- Shingrix  Discontinued    Health Maintenance  There are no preventive care reminders to display for this patient.   Colorectal cancer screening: Type of screening: Colonoscopy. Completed 03/30/18. Repeat every 5 years  Lung Cancer Screening: (Low Dose CT Chest recommended if Age 73-80ears, 30 pack-year currently smoking OR have quit w/in 15years.) does not qualify.   Lung Cancer Screening Referral: n/a  Additional Screening:  Hepatitis C Screening: does qualify; Completed 06/17/17  Vision Screening: Recommended annual ophthalmology exams for early detection of glaucoma and other disorders of the eye. Is the patient up to date with their annual eye exam?  Yes  Who is the provider or what is the name of the office in which the patient attends annual eye exams? [place]-Dr. Tsamis If pt is not established with a provider, would they like to be referred to a provider to establish care? No .   Dental Screening: Recommended annual dental exams for proper oral hygiene  Community Resource Referral / Chronic Care Management: CRR required this visit?  No   CCM required this visit?  No      Plan:     I have  personally reviewed and noted the following in the patient's chart:   Medical and social history Use of alcohol, tobacco or illicit drugs  Current medications and supplements including opioid prescriptions. Patient is not currently taking opioid prescriptions. Functional ability and status Nutritional status Physical activity Advanced directives List of other physicians Hospitalizations, surgeries, and ER visits in previous 12 months Vitals Screenings to include cognitive, depression, and falls Referrals and appointments  In addition, I have reviewed and discussed with patient certain preventive protocols, quality metrics, and best practice recommendations. A written personalized care plan for preventive services as well as general preventive health recommendations were provided to patient.     ShDuard Bradyhism, CMPhoenixville 08/31/2021   Nurse Notes: none

## 2021-08-31 NOTE — Patient Instructions (Addendum)
Keep the diet clean and stay active.  Aim to do some physical exertion for 150 minutes per week. This is typically divided into 5 days per week, 30 minutes per day. The activity should be enough to get your heart rate up. Anything is better than nothing if you have time constraints.  If we need more physical therapy to boost your strength, please let me know.  Someone will schedule your MRI for the meningioma around 3 months from now.   Let us know if you need anything.  Healthy Eating Plan Many factors influence your heart health, including eating and exercise habits. Heart (coronary) risk increases with abnormal blood fat (lipid) levels. Heart-healthy meal planning includes limiting unhealthy fats, increasing healthy fats, and making other small dietary changes. This includes maintaining a healthy body weight to help keep lipid levels within a normal range.  WHAT IS MY PLAN?  Your health care provider recommends that you: Drink a glass of water before meals to help with satiety. Eat slowly. An alternative to the water is to add Metamucil. This will help with satiety as well. It does contain calories, unlike water.  WHAT TYPES OF FAT SHOULD I CHOOSE? Choose healthy fats more often. Choose monounsaturated and polyunsaturated fats, such as olive oil and canola oil, flaxseeds, walnuts, almonds, and seeds. Eat more omega-3 fats. Good choices include salmon, mackerel, sardines, tuna, flaxseed oil, and ground flaxseeds. Aim to eat fish at least two times each week. Avoid foods with partially hydrogenated oils in them. These contain trans fats. Examples of foods that contain trans fats are stick margarine, some tub margarines, cookies, crackers, and other baked goods. If you are going to avoid a fat, this is the one to avoid!  WHAT GENERAL GUIDELINES DO I NEED TO FOLLOW? Check food labels carefully to identify foods with trans fats. Avoid these types of options when possible. Fill one half of  your plate with vegetables and green salads. Eat 4-5 servings of vegetables per day. A serving of vegetables equals 1 cup of raw leafy vegetables,  cup of raw or cooked cut-up vegetables, or  cup of vegetable juice. Fill one fourth of your plate with whole grains. Look for the word "whole" as the first word in the ingredient list. Fill one fourth of your plate with lean protein foods. Eat 4-5 servings of fruit per day. A serving of fruit equals one medium whole fruit,  cup of dried fruit,  cup of fresh, frozen, or canned fruit. Try to avoid fruits in cups/syrups as the sugar content can be high. Eat more foods that contain soluble fiber. Examples of foods that contain this type of fiber are apples, broccoli, carrots, beans, peas, and barley. Aim to get 20-30 g of fiber per day. Eat more home-cooked food and less restaurant, buffet, and fast food. Limit or avoid alcohol. Limit foods that are high in starch and sugar. Avoid fried foods when able. Cook foods by using methods other than frying. Baking, boiling, grilling, and broiling are all great options. Other fat-reducing suggestions include: Removing the skin from poultry. Removing all visible fats from meats. Skimming the fat off of stews, soups, and gravies before serving them. Steaming vegetables in water or broth. Lose weight if you are overweight. Losing just 5-10% of your initial body weight can help your overall health and prevent diseases such as diabetes and heart disease. Increase your consumption of nuts, legumes, and seeds to 4-5 servings per week. One serving of dried beans or  legumes equals  cup after being cooked, one serving of nuts equals 1 ounces, and one serving of seeds equals  ounce or 1 tablespoon.  WHAT ARE GOOD FOODS CAN I EAT? Grains Grainy breads (try to find bread that is 3 g of fiber per slice or greater), oatmeal, light popcorn. Whole-grain cereals. Rice and pasta, including brown rice and those that are made  with whole wheat. Edamame pasta is a great alternative to grain pasta. It has a higher protein content. Try to avoid significant consumption of white bread, sugary cereals, or pastries/baked goods.  Vegetables All vegetables. Cooked white potatoes do not count as vegetables.  Fruits All fruits, but limit pineapple and bananas as these fruits have a higher sugar content.  Meats and Other Protein Sources Lean, well-trimmed beef, veal, pork, and lamb. Chicken and [place] without skin. All fish and shellfish. Wild duck, rabbit, pheasant, and venison. Egg whites or low-cholesterol egg substitutes. Dried beans, peas, lentils, and tofu. Seeds and most nuts.  Dairy Low-fat or nonfat cheeses, including ricotta, string, and mozzarella. Skim or 1% milk that is liquid, powdered, or evaporated. Buttermilk that is made with low-fat milk. Nonfat or low-fat yogurt. Soy/Almond milk are good alternatives if you cannot handle dairy.  Beverages Water is the best for you. Sports drinks with less sugar are more desirable unless you are a highly active athlete.  Sweets and Desserts Sherbets and fruit ices. Honey, jam, marmalade, jelly, and syrups. Dark chocolate.  Eat all sweets and desserts in moderation.  Fats and Oils Nonhydrogenated (trans-free) margarines. Vegetable oils, including soybean, sesame, sunflower, olive, peanut, safflower, corn, canola, and cottonseed. Salad dressings or mayonnaise that are made with a vegetable oil. Limit added fats and oils that you use for cooking, baking, salads, and as spreads.  Other Cocoa powder. Coffee and tea. Most condiments.  The items listed above may not be a complete list of recommended foods or beverages. Contact your dietitian for more options.

## 2021-09-08 ENCOUNTER — Telehealth: Payer: Self-pay | Admitting: Family Medicine

## 2021-09-08 NOTE — Telephone Encounter (Signed)
Referral coordinator has reopened the referral and will see if another [facility] agency will see this patient.

## 2021-09-08 NOTE — Telephone Encounter (Signed)
Called the sister and she stated the [facility] referred to is not working out and wants to be referred elsewhere. Will see if referral coordinator can reopen referral and if not will put in new one.

## 2021-09-08 NOTE — Telephone Encounter (Signed)
Pt's Sister, Lorriane Shire, called and would like to speak with Shirlean Mylar regarding the home health service pt currently has.  She would like to look into another agency for pt.  Lorriane Shire can be reached at [phone].

## 2021-09-11 ENCOUNTER — Other Ambulatory Visit: Payer: Self-pay | Admitting: Family Medicine

## 2021-09-11 DIAGNOSIS — E119 Type 2 diabetes mellitus without complications: Secondary | ICD-10-CM

## 2021-09-11 MED ORDER — NOVOLOG MIX 70/30 FLEXPEN (70-30) 100 UNIT/ML ~~LOC~~ SUPN: PEN_INJECTOR | SUBCUTANEOUS | 3 refills | Status: DC

## 2021-09-20 DIAGNOSIS — E1136 Type 2 diabetes mellitus with diabetic cataract: Secondary | ICD-10-CM | POA: Diagnosis not present

## 2021-09-20 DIAGNOSIS — E785 Hyperlipidemia, unspecified: Secondary | ICD-10-CM | POA: Diagnosis not present

## 2021-09-20 DIAGNOSIS — D329 Benign neoplasm of meninges, unspecified: Secondary | ICD-10-CM | POA: Diagnosis not present

## 2021-09-20 DIAGNOSIS — G3184 Mild cognitive impairment, so stated: Secondary | ICD-10-CM | POA: Diagnosis not present

## 2021-09-20 DIAGNOSIS — Z791 Long term (current) use of non-steroidal anti-inflammatories (NSAID): Secondary | ICD-10-CM | POA: Diagnosis not present

## 2021-09-20 DIAGNOSIS — I10 Essential (primary) hypertension: Secondary | ICD-10-CM | POA: Diagnosis not present

## 2021-09-20 DIAGNOSIS — E1165 Type 2 diabetes mellitus with hyperglycemia: Secondary | ICD-10-CM | POA: Diagnosis not present

## 2021-09-20 DIAGNOSIS — M19019 Primary osteoarthritis, unspecified shoulder: Secondary | ICD-10-CM | POA: Diagnosis not present

## 2021-09-20 DIAGNOSIS — Z7982 Long term (current) use of aspirin: Secondary | ICD-10-CM | POA: Diagnosis not present

## 2021-09-20 DIAGNOSIS — Z7984 Long term (current) use of oral hypoglycemic drugs: Secondary | ICD-10-CM | POA: Diagnosis not present

## 2021-09-20 DIAGNOSIS — D519 Vitamin B12 deficiency anemia, unspecified: Secondary | ICD-10-CM | POA: Diagnosis not present

## 2021-09-20 DIAGNOSIS — Z794 Long term (current) use of insulin: Secondary | ICD-10-CM | POA: Diagnosis not present

## 2021-09-25 ENCOUNTER — Telehealth: Payer: Self-pay | Admitting: Family Medicine

## 2021-09-25 DIAGNOSIS — D519 Vitamin B12 deficiency anemia, unspecified: Secondary | ICD-10-CM | POA: Diagnosis not present

## 2021-09-25 DIAGNOSIS — E785 Hyperlipidemia, unspecified: Secondary | ICD-10-CM | POA: Diagnosis not present

## 2021-09-25 DIAGNOSIS — D329 Benign neoplasm of meninges, unspecified: Secondary | ICD-10-CM | POA: Diagnosis not present

## 2021-09-25 DIAGNOSIS — I10 Essential (primary) hypertension: Secondary | ICD-10-CM | POA: Diagnosis not present

## 2021-09-25 DIAGNOSIS — E1165 Type 2 diabetes mellitus with hyperglycemia: Secondary | ICD-10-CM | POA: Diagnosis not present

## 2021-09-25 DIAGNOSIS — G3184 Mild cognitive impairment, so stated: Secondary | ICD-10-CM | POA: Diagnosis not present

## 2021-09-25 NOTE — Telephone Encounter (Signed)
Received HH orders from [employer] (printed from [facility]) PCP signed/faxed back [phone] [place]. And Plan of care.

## 2021-09-29 DIAGNOSIS — D519 Vitamin B12 deficiency anemia, unspecified: Secondary | ICD-10-CM | POA: Diagnosis not present

## 2021-09-29 DIAGNOSIS — E785 Hyperlipidemia, unspecified: Secondary | ICD-10-CM | POA: Diagnosis not present

## 2021-09-29 DIAGNOSIS — E1165 Type 2 diabetes mellitus with hyperglycemia: Secondary | ICD-10-CM | POA: Diagnosis not present

## 2021-09-29 DIAGNOSIS — I10 Essential (primary) hypertension: Secondary | ICD-10-CM | POA: Diagnosis not present

## 2021-09-29 DIAGNOSIS — D329 Benign neoplasm of meninges, unspecified: Secondary | ICD-10-CM | POA: Diagnosis not present

## 2021-09-29 DIAGNOSIS — G3184 Mild cognitive impairment, so stated: Secondary | ICD-10-CM | POA: Diagnosis not present

## 2021-10-01 DIAGNOSIS — I10 Essential (primary) hypertension: Secondary | ICD-10-CM | POA: Diagnosis not present

## 2021-10-01 DIAGNOSIS — G3184 Mild cognitive impairment, so stated: Secondary | ICD-10-CM | POA: Diagnosis not present

## 2021-10-01 DIAGNOSIS — E785 Hyperlipidemia, unspecified: Secondary | ICD-10-CM | POA: Diagnosis not present

## 2021-10-01 DIAGNOSIS — D329 Benign neoplasm of meninges, unspecified: Secondary | ICD-10-CM | POA: Diagnosis not present

## 2021-10-01 DIAGNOSIS — E1165 Type 2 diabetes mellitus with hyperglycemia: Secondary | ICD-10-CM | POA: Diagnosis not present

## 2021-10-01 DIAGNOSIS — D519 Vitamin B12 deficiency anemia, unspecified: Secondary | ICD-10-CM | POA: Diagnosis not present

## 2021-10-02 ENCOUNTER — Ambulatory Visit: Payer: Medicare Other

## 2021-10-02 ENCOUNTER — Ambulatory Visit: Payer: Medicare Other | Admitting: Family Medicine

## 2021-10-05 DIAGNOSIS — D519 Vitamin B12 deficiency anemia, unspecified: Secondary | ICD-10-CM | POA: Diagnosis not present

## 2021-10-05 DIAGNOSIS — G3184 Mild cognitive impairment, so stated: Secondary | ICD-10-CM | POA: Diagnosis not present

## 2021-10-05 DIAGNOSIS — E1165 Type 2 diabetes mellitus with hyperglycemia: Secondary | ICD-10-CM | POA: Diagnosis not present

## 2021-10-05 DIAGNOSIS — D329 Benign neoplasm of meninges, unspecified: Secondary | ICD-10-CM | POA: Diagnosis not present

## 2021-10-05 DIAGNOSIS — E785 Hyperlipidemia, unspecified: Secondary | ICD-10-CM | POA: Diagnosis not present

## 2021-10-05 DIAGNOSIS — I10 Essential (primary) hypertension: Secondary | ICD-10-CM | POA: Diagnosis not present

## 2021-10-08 DIAGNOSIS — E785 Hyperlipidemia, unspecified: Secondary | ICD-10-CM | POA: Diagnosis not present

## 2021-10-08 DIAGNOSIS — D329 Benign neoplasm of meninges, unspecified: Secondary | ICD-10-CM | POA: Diagnosis not present

## 2021-10-08 DIAGNOSIS — G3184 Mild cognitive impairment, so stated: Secondary | ICD-10-CM | POA: Diagnosis not present

## 2021-10-08 DIAGNOSIS — I10 Essential (primary) hypertension: Secondary | ICD-10-CM | POA: Diagnosis not present

## 2021-10-08 DIAGNOSIS — E1165 Type 2 diabetes mellitus with hyperglycemia: Secondary | ICD-10-CM | POA: Diagnosis not present

## 2021-10-08 DIAGNOSIS — D519 Vitamin B12 deficiency anemia, unspecified: Secondary | ICD-10-CM | POA: Diagnosis not present

## 2021-10-12 DIAGNOSIS — E785 Hyperlipidemia, unspecified: Secondary | ICD-10-CM | POA: Diagnosis not present

## 2021-10-12 DIAGNOSIS — I10 Essential (primary) hypertension: Secondary | ICD-10-CM | POA: Diagnosis not present

## 2021-10-12 DIAGNOSIS — G3184 Mild cognitive impairment, so stated: Secondary | ICD-10-CM | POA: Diagnosis not present

## 2021-10-12 DIAGNOSIS — E1165 Type 2 diabetes mellitus with hyperglycemia: Secondary | ICD-10-CM | POA: Diagnosis not present

## 2021-10-12 DIAGNOSIS — D329 Benign neoplasm of meninges, unspecified: Secondary | ICD-10-CM | POA: Diagnosis not present

## 2021-10-12 DIAGNOSIS — D519 Vitamin B12 deficiency anemia, unspecified: Secondary | ICD-10-CM | POA: Diagnosis not present

## 2021-10-13 DIAGNOSIS — I10 Essential (primary) hypertension: Secondary | ICD-10-CM | POA: Diagnosis not present

## 2021-10-13 DIAGNOSIS — E1165 Type 2 diabetes mellitus with hyperglycemia: Secondary | ICD-10-CM | POA: Diagnosis not present

## 2021-10-13 DIAGNOSIS — G3184 Mild cognitive impairment, so stated: Secondary | ICD-10-CM | POA: Diagnosis not present

## 2021-10-13 DIAGNOSIS — E785 Hyperlipidemia, unspecified: Secondary | ICD-10-CM | POA: Diagnosis not present

## 2021-10-13 DIAGNOSIS — D329 Benign neoplasm of meninges, unspecified: Secondary | ICD-10-CM | POA: Diagnosis not present

## 2021-10-13 DIAGNOSIS — D519 Vitamin B12 deficiency anemia, unspecified: Secondary | ICD-10-CM | POA: Diagnosis not present

## 2021-10-17 ENCOUNTER — Other Ambulatory Visit: Payer: Self-pay | Admitting: Family Medicine

## 2021-10-17 DIAGNOSIS — U071 COVID-19: Secondary | ICD-10-CM

## 2021-10-19 DIAGNOSIS — D329 Benign neoplasm of meninges, unspecified: Secondary | ICD-10-CM | POA: Diagnosis not present

## 2021-10-19 DIAGNOSIS — E785 Hyperlipidemia, unspecified: Secondary | ICD-10-CM | POA: Diagnosis not present

## 2021-10-19 DIAGNOSIS — G3184 Mild cognitive impairment, so stated: Secondary | ICD-10-CM | POA: Diagnosis not present

## 2021-10-19 DIAGNOSIS — I10 Essential (primary) hypertension: Secondary | ICD-10-CM | POA: Diagnosis not present

## 2021-10-19 DIAGNOSIS — D519 Vitamin B12 deficiency anemia, unspecified: Secondary | ICD-10-CM | POA: Diagnosis not present

## 2021-10-19 DIAGNOSIS — E1165 Type 2 diabetes mellitus with hyperglycemia: Secondary | ICD-10-CM | POA: Diagnosis not present

## 2021-10-20 DIAGNOSIS — D519 Vitamin B12 deficiency anemia, unspecified: Secondary | ICD-10-CM | POA: Diagnosis not present

## 2021-10-20 DIAGNOSIS — E1136 Type 2 diabetes mellitus with diabetic cataract: Secondary | ICD-10-CM | POA: Diagnosis not present

## 2021-10-20 DIAGNOSIS — E785 Hyperlipidemia, unspecified: Secondary | ICD-10-CM | POA: Diagnosis not present

## 2021-10-20 DIAGNOSIS — Z794 Long term (current) use of insulin: Secondary | ICD-10-CM | POA: Diagnosis not present

## 2021-10-20 DIAGNOSIS — I10 Essential (primary) hypertension: Secondary | ICD-10-CM | POA: Diagnosis not present

## 2021-10-20 DIAGNOSIS — Z7984 Long term (current) use of oral hypoglycemic drugs: Secondary | ICD-10-CM | POA: Diagnosis not present

## 2021-10-20 DIAGNOSIS — M19019 Primary osteoarthritis, unspecified shoulder: Secondary | ICD-10-CM | POA: Diagnosis not present

## 2021-10-20 DIAGNOSIS — Z7982 Long term (current) use of aspirin: Secondary | ICD-10-CM | POA: Diagnosis not present

## 2021-10-20 DIAGNOSIS — E1165 Type 2 diabetes mellitus with hyperglycemia: Secondary | ICD-10-CM | POA: Diagnosis not present

## 2021-10-20 DIAGNOSIS — Z791 Long term (current) use of non-steroidal anti-inflammatories (NSAID): Secondary | ICD-10-CM | POA: Diagnosis not present

## 2021-10-20 DIAGNOSIS — D329 Benign neoplasm of meninges, unspecified: Secondary | ICD-10-CM | POA: Diagnosis not present

## 2021-10-20 DIAGNOSIS — G3184 Mild cognitive impairment, so stated: Secondary | ICD-10-CM | POA: Diagnosis not present

## 2021-10-26 DIAGNOSIS — E1165 Type 2 diabetes mellitus with hyperglycemia: Secondary | ICD-10-CM | POA: Diagnosis not present

## 2021-10-26 DIAGNOSIS — E785 Hyperlipidemia, unspecified: Secondary | ICD-10-CM | POA: Diagnosis not present

## 2021-10-26 DIAGNOSIS — G3184 Mild cognitive impairment, so stated: Secondary | ICD-10-CM | POA: Diagnosis not present

## 2021-10-26 DIAGNOSIS — D519 Vitamin B12 deficiency anemia, unspecified: Secondary | ICD-10-CM | POA: Diagnosis not present

## 2021-10-26 DIAGNOSIS — D329 Benign neoplasm of meninges, unspecified: Secondary | ICD-10-CM | POA: Diagnosis not present

## 2021-10-26 DIAGNOSIS — I10 Essential (primary) hypertension: Secondary | ICD-10-CM | POA: Diagnosis not present

## 2021-10-30 ENCOUNTER — Telehealth: Payer: Self-pay | Admitting: Family Medicine

## 2021-10-30 ENCOUNTER — Ambulatory Visit (INDEPENDENT_AMBULATORY_CARE_PROVIDER_SITE_OTHER): Payer: Medicare Other | Admitting: Family Medicine

## 2021-10-30 ENCOUNTER — Encounter: Payer: Self-pay | Admitting: Family Medicine

## 2021-10-30 VITALS — BP 120/70 | HR 74 | Temp 97.7°F | Ht 71.0 in | Wt 205.2 lb

## 2021-10-30 DIAGNOSIS — M722 Plantar fascial fibromatosis: Secondary | ICD-10-CM | POA: Diagnosis not present

## 2021-10-30 MED ORDER — MELOXICAM 7.5 MG PO TABS: 7.5000 mg | ORAL_TABLET | Freq: Every day | ORAL | 0 refills | Status: DC

## 2021-10-30 MED ORDER — METHYLPREDNISOLONE ACETATE 80 MG/ML IJ SUSP
80.0000 mg | Freq: Once | INTRAMUSCULAR | Status: AC
  Administered 2021-10-30: 80 mg via INTRAMUSCULAR

## 2021-10-30 NOTE — Addendum Note (Signed)
Addended by: Sharon Seller B on: 10/30/2021 01:41 PM   Modules accepted: Orders

## 2021-10-30 NOTE — Progress Notes (Signed)
Musculoskeletal Exam  Patient: Phillip Richard DOB: 10-02-1950  DOS: 10/30/2021  SUBJECTIVE:  Chief Complaint:   Chief Complaint  Patient presents with   Foot Pain    Left for 2 weeks     [name] is a 71 y.o.  male for evaluation and treatment of L heel pain.   Onset:  2 weeks ago. No inj. Started   Location: L heel Character:  sharp, burning Progression of issue:  is unchanged Associated symptoms: no swelling, redness, bruising; worse w first steps of the day Treatment: to date has been: Some cream given by ALF.   Neurovascular symptoms: no  Past Medical History:  Diagnosis Date   Allergic rhinitis    Allergy    Arthritis    shoulder   Cataract    both eyes - just watching now   Diabetes mellitus without complication (HCC)    History of chicken pox    History of lead poisoning    Hyperlipidemia    Mild cognitive impairment    Mumps    Uses walker    ambulates with walker    Objective: VITAL SIGNS: BP 120/70   Pulse 74   Temp 97.7 F (36.5 C) (Oral)   Ht '5\' 11"'$  (1.803 m)   Wt 205 lb 4 oz (93.1 kg)   SpO2 97%   BMI 28.63 kg/m  Constitutional: Well formed, well developed. No acute distress. Thorax & Lungs: No accessory muscle use Musculoskeletal: L foot.   Normal active range of motion: yes.   Normal passive range of motion: yes Tenderness to palpation: yes over prox insertion of PF Deformity: no Ecchymosis: no Neurologic: Normal sensory function. No focal deficits noted. Psychiatric: Normal mood. Age appropriate judgment and insight. Alert & oriented x 3.    Assessment:  Plantar fasciitis, left - Plan: meloxicam (MOBIC) 7.5 MG tablet  Plan: Stretches/exercises, heat, ice, Tylenol. Consider [place] sock.  F/u as originally scheduled, 3-4 weeks if no improvement, will consider injection vs PT vs both. The patient voiced understanding and agreement to the plan.   [place], DO 10/30/21  1:20 PM

## 2021-10-30 NOTE — Patient Instructions (Addendum)
Ice/cold pack over area for 10-15 min twice daily.  OK to take Tylenol 1000 mg (2 extra strength tabs) or 975 mg (3 regular strength tabs) every 6 hours as needed.  Consider a [place] sock to wear at night.   If no improvement in the next 3-4 weeks, come back and we can discuss the next steps.   Plantar Fasciitis Stretches/exercises Do exercises exactly as told by your health care provider and adjust them as directed. It is normal to feel mild stretching, pulling, tightness, or discomfort as you do these exercises, but you should stop right away if you feel sudden pain or your pain gets worse.   Stretching and range of motion exercises These exercises warm up your muscles and joints and improve the movement and flexibility of your foot. These exercises also help to relieve pain.  Exercise A: Plantar fascia stretch Sit with your left / right leg crossed over your opposite knee. Hold your heel with one hand with that thumb near your arch. With your other hand, hold your toes and gently pull them back toward the top of your foot. You should feel a stretch on the bottom of your toes or your foot or both. Hold this stretch for 30 seconds. Slowly release your toes and return to the starting position. Repeat 2 times. Complete this exercise 3 times per week.  Exercise B: Gastroc, standing Stand with your hands against a wall. Extend your left / right leg behind you, and bend your front knee slightly. Keeping your heels on the floor and keeping your back knee straight, shift your weight toward the wall without arching your back. You should feel a gentle stretch in your left / right calf. Hold this position for 30 seconds. Repeat 2 times. Complete this exercise 3 times a week. Exercise C: Soleus, standing Stand with your hands against a wall. Extend your left / right leg behind you, and bend your front knee slightly. Keeping your heels on the floor, bend your back knee and slightly shift your  weight over the back leg. You should feel a gentle stretch deep in your calf. Hold this position for 30 seconds. Repeat 2 times. Complete this exercise 3 times per week. Exercise D: Gastrocsoleus, standing Stand with the ball of your left / right foot on a step. The ball of your foot is on the walking surface, right under your toes. Keep your other foot firmly on the same step. Hold onto the wall or a railing for balance. Slowly lift your other foot, allowing your body weight to press your heel down over the edge of the step. You should feel a stretch in your left / right calf. Hold this position for 30 seconds. Return both feet to the step. Repeat this exercise with a slight bend in your left / right knee. Repeat 2 times with your left / right knee straight and 2times with your left / right knee bent. Complete this exercise 3 times a week.  Balance exercise This exercise builds your balance and strength control of your arch to help take pressure off your plantar fascia. Exercise E: Single leg stand Without shoes, stand near a railing or in a doorway. You may hold onto the railing or door frame as needed. Stand on your left / right foot. Keep your big toe down on the floor and try to keep your arch lifted. Do not let your foot roll inward. Hold this position for 30 seconds. If this exercise is too  easy, you can try it with your eyes closed or while standing on a pillow. Repeat 2 times. Complete this exercise 3 times per week. This information is not intended to replace advice given to you by your health care provider. Make sure you discuss any questions you have with your health care provider. Document Released: 02/15/2005 Document Revised: 10/21/2015 Document Reviewed: 12/30/2014 Elsevier Interactive Patient Education  2017 [employer].

## 2021-10-30 NOTE — Telephone Encounter (Signed)
Pt's family member dropped off document to be filled out by provider [facility] paperwork in a little white envelope) Pt's brother would like to be called at [phone] when document ready. Document put at front office tray under providers name.

## 2021-11-03 NOTE — Telephone Encounter (Signed)
PCP completed/family member called to pickup

## 2021-11-04 DIAGNOSIS — D329 Benign neoplasm of meninges, unspecified: Secondary | ICD-10-CM | POA: Diagnosis not present

## 2021-11-04 DIAGNOSIS — G3184 Mild cognitive impairment, so stated: Secondary | ICD-10-CM | POA: Diagnosis not present

## 2021-11-04 DIAGNOSIS — E1165 Type 2 diabetes mellitus with hyperglycemia: Secondary | ICD-10-CM | POA: Diagnosis not present

## 2021-11-04 DIAGNOSIS — I10 Essential (primary) hypertension: Secondary | ICD-10-CM | POA: Diagnosis not present

## 2021-11-04 DIAGNOSIS — D519 Vitamin B12 deficiency anemia, unspecified: Secondary | ICD-10-CM | POA: Diagnosis not present

## 2021-11-04 DIAGNOSIS — E785 Hyperlipidemia, unspecified: Secondary | ICD-10-CM | POA: Diagnosis not present

## 2021-11-11 DIAGNOSIS — D519 Vitamin B12 deficiency anemia, unspecified: Secondary | ICD-10-CM | POA: Diagnosis not present

## 2021-11-11 DIAGNOSIS — E785 Hyperlipidemia, unspecified: Secondary | ICD-10-CM | POA: Diagnosis not present

## 2021-11-11 DIAGNOSIS — G3184 Mild cognitive impairment, so stated: Secondary | ICD-10-CM | POA: Diagnosis not present

## 2021-11-11 DIAGNOSIS — E1165 Type 2 diabetes mellitus with hyperglycemia: Secondary | ICD-10-CM | POA: Diagnosis not present

## 2021-11-11 DIAGNOSIS — I10 Essential (primary) hypertension: Secondary | ICD-10-CM | POA: Diagnosis not present

## 2021-11-11 DIAGNOSIS — D329 Benign neoplasm of meninges, unspecified: Secondary | ICD-10-CM | POA: Diagnosis not present

## 2021-11-12 ENCOUNTER — Other Ambulatory Visit: Payer: Self-pay | Admitting: Family Medicine

## 2021-11-12 DIAGNOSIS — B353 Tinea pedis: Secondary | ICD-10-CM

## 2021-11-18 DIAGNOSIS — D519 Vitamin B12 deficiency anemia, unspecified: Secondary | ICD-10-CM | POA: Diagnosis not present

## 2021-11-18 DIAGNOSIS — I10 Essential (primary) hypertension: Secondary | ICD-10-CM | POA: Diagnosis not present

## 2021-11-18 DIAGNOSIS — D329 Benign neoplasm of meninges, unspecified: Secondary | ICD-10-CM | POA: Diagnosis not present

## 2021-11-18 DIAGNOSIS — E785 Hyperlipidemia, unspecified: Secondary | ICD-10-CM | POA: Diagnosis not present

## 2021-11-18 DIAGNOSIS — G3184 Mild cognitive impairment, so stated: Secondary | ICD-10-CM | POA: Diagnosis not present

## 2021-11-18 DIAGNOSIS — E1165 Type 2 diabetes mellitus with hyperglycemia: Secondary | ICD-10-CM | POA: Diagnosis not present

## 2021-11-19 ENCOUNTER — Telehealth: Payer: Self-pay | Admitting: *Deleted

## 2021-11-19 ENCOUNTER — Encounter: Payer: Self-pay | Admitting: *Deleted

## 2021-11-19 NOTE — Patient Outreach (Signed)
  Care Coordination TOC Note Transition Care Management Unsuccessful Follow-up Telephone Call  Date of discharge and from where:  Wednesday 11/18/21- unknown location; KPN review confirms facility discharge on 11/18/21 without specification of facility  Attempts:  1st Attempt  Reason for unsuccessful TCM follow-up call:  Left voice message  Oneta Rack, RN, BSN, CCRN Alumnus RN CM Care Coordination/ Transition of [place] Management [phone]: direct office

## 2021-11-20 ENCOUNTER — Encounter: Payer: Self-pay | Admitting: *Deleted

## 2021-11-20 ENCOUNTER — Telehealth: Payer: Self-pay | Admitting: *Deleted

## 2021-11-20 NOTE — Patient Outreach (Signed)
  Care Coordination TOC Note Transition Care Management Unsuccessful Follow-up Telephone Call  Date of discharge and from where:  Wednesday, 11/18/21 unknown location; KPN review confirms facility discharge on 11/18/21 without specification of facility  Attempts:  2nd Attempt  Reason for unsuccessful TCM follow-up call:  Left voice message  Oneta Rack, RN, BSN, CCRN Alumnus RN CM Care Coordination/ Transition of [place] Management [phone]: direct office

## 2021-11-23 ENCOUNTER — Telehealth: Payer: Self-pay | Admitting: *Deleted

## 2021-11-23 ENCOUNTER — Encounter: Payer: Self-pay | Admitting: *Deleted

## 2021-11-23 NOTE — Patient Outreach (Signed)
  Care Coordination TOC Note Transition Care Management Unsuccessful Follow-up Telephone Call  Date of discharge and from where:  [facility] call attempt # 3- successful outreach with caregiver/ sister Lorriane Shire on [facility] DPR: she reports patient is "doing fine and has not been in the hospital anytime recently"  Attempts:  3rd Attempt  Reason for unsuccessful TCM follow-up call:  Left voice message- left previous messages; successfully connected with caregiver/ sister today, however, no recent hospital visit reported: [facility] call deferred accordingly  Oneta Rack, RN, BSN, CCRN Alumnus RN CM Care Coordination/ Transition of [place] Management [phone]: direct office

## 2021-11-30 ENCOUNTER — Ambulatory Visit: Payer: Medicare Other | Admitting: Family Medicine

## 2021-12-04 ENCOUNTER — Other Ambulatory Visit: Payer: Self-pay | Admitting: Family Medicine

## 2021-12-04 DIAGNOSIS — M722 Plantar fascial fibromatosis: Secondary | ICD-10-CM

## 2021-12-09 ENCOUNTER — Telehealth (HOSPITAL_BASED_OUTPATIENT_CLINIC_OR_DEPARTMENT_OTHER): Payer: Self-pay

## 2021-12-15 DIAGNOSIS — Z23 Encounter for immunization: Secondary | ICD-10-CM | POA: Diagnosis not present

## 2022-01-02 ENCOUNTER — Ambulatory Visit (HOSPITAL_BASED_OUTPATIENT_CLINIC_OR_DEPARTMENT_OTHER)
Admission: RE | Admit: 2022-01-02 | Discharge: 2022-01-02 | Disposition: A | Discharge status: Home / Self Care | Payer: Medicare Other | Source: Ambulatory Visit | Attending: Family Medicine | Admitting: Family Medicine

## 2022-01-02 DIAGNOSIS — D329 Benign neoplasm of meninges, unspecified: Secondary | ICD-10-CM | POA: Insufficient documentation

## 2022-01-02 DIAGNOSIS — R262 Difficulty in walking, not elsewhere classified: Secondary | ICD-10-CM | POA: Diagnosis not present

## 2022-01-02 DIAGNOSIS — R2681 Unsteadiness on feet: Secondary | ICD-10-CM | POA: Diagnosis not present

## 2022-01-02 MED ORDER — GADOBUTROL 1 MMOL/ML IV SOLN
9.0000 mL | Freq: Once | INTRAVENOUS | Status: AC | PRN
  Administered 2022-01-02: 9 mL via INTRAVENOUS

## 2022-01-19 ENCOUNTER — Other Ambulatory Visit: Payer: Self-pay | Admitting: Family Medicine

## 2022-01-19 DIAGNOSIS — U071 COVID-19: Secondary | ICD-10-CM

## 2022-01-24 ENCOUNTER — Other Ambulatory Visit: Payer: Self-pay | Admitting: Family Medicine

## 2022-01-24 DIAGNOSIS — M722 Plantar fascial fibromatosis: Secondary | ICD-10-CM

## 2022-01-24 DIAGNOSIS — B353 Tinea pedis: Secondary | ICD-10-CM

## 2022-01-28 ENCOUNTER — Other Ambulatory Visit: Payer: Self-pay | Admitting: Family Medicine

## 2022-01-28 DIAGNOSIS — E119 Type 2 diabetes mellitus without complications: Secondary | ICD-10-CM

## 2022-02-17 ENCOUNTER — Other Ambulatory Visit: Payer: Self-pay | Admitting: Family Medicine

## 2022-02-17 DIAGNOSIS — E119 Type 2 diabetes mellitus without complications: Secondary | ICD-10-CM

## 2022-02-18 DIAGNOSIS — R5383 Other fatigue: Secondary | ICD-10-CM | POA: Diagnosis not present

## 2022-02-18 DIAGNOSIS — J328 Other chronic sinusitis: Secondary | ICD-10-CM | POA: Diagnosis not present

## 2022-02-18 DIAGNOSIS — R0981 Nasal congestion: Secondary | ICD-10-CM | POA: Diagnosis not present

## 2022-02-18 DIAGNOSIS — R0602 Shortness of breath: Secondary | ICD-10-CM | POA: Diagnosis not present

## 2022-02-18 DIAGNOSIS — R0789 Other chest pain: Secondary | ICD-10-CM | POA: Diagnosis not present

## 2022-02-18 DIAGNOSIS — R5081 Fever presenting with conditions classified elsewhere: Secondary | ICD-10-CM | POA: Diagnosis not present

## 2022-02-25 ENCOUNTER — Other Ambulatory Visit: Payer: Self-pay

## 2022-02-25 ENCOUNTER — Other Ambulatory Visit: Payer: Self-pay | Admitting: Family Medicine

## 2022-02-25 DIAGNOSIS — B353 Tinea pedis: Secondary | ICD-10-CM

## 2022-02-25 DIAGNOSIS — M722 Plantar fascial fibromatosis: Secondary | ICD-10-CM

## 2022-02-25 DIAGNOSIS — E119 Type 2 diabetes mellitus without complications: Secondary | ICD-10-CM

## 2022-02-25 MED ORDER — MELOXICAM 7.5 MG PO TABS: 7.5000 mg | ORAL_TABLET | Freq: Every day | ORAL | 1 refills | Status: DC | PRN

## 2022-02-25 MED ORDER — KETOCONAZOLE 2 % EX CREA: TOPICAL_CREAM | Freq: Every day | CUTANEOUS | 0 refills | Status: DC

## 2022-02-25 MED ORDER — METFORMIN HCL 500 MG PO TABS: 1000.0000 mg | ORAL_TABLET | Freq: Two times a day (BID) | ORAL | 0 refills | Status: DC

## 2022-02-26 ENCOUNTER — Other Ambulatory Visit: Payer: Self-pay | Admitting: Family Medicine

## 2022-02-26 DIAGNOSIS — B353 Tinea pedis: Secondary | ICD-10-CM

## 2022-03-19 ENCOUNTER — Telehealth: Payer: Self-pay | Admitting: Family Medicine

## 2022-03-19 NOTE — Telephone Encounter (Signed)
Per chart review, he takes metformin, NovoLog 70/30: 16 units twice daily. Please confirm that is what she is using. Then recommend to change NovoLog 70/30: 16 units in the morning 8 units in the afternoon Call with CBGs next week

## 2022-03-19 NOTE — Telephone Encounter (Signed)
Phillip Richard at [facility] is calling regarding patient's blood sugar. She said around 5:30-6:00 am this morning and it was 65. She checked it about 20 minutes and it was at 68. She gave him some orange juice and said for breakfast she "gave him a free for all." She said there aren't clear orders on the insulin other than call for anything under 70. Please call her back at [phone]. If she does not answer, you can just ask for her.

## 2022-03-19 NOTE — Telephone Encounter (Signed)
Called [facility] and updated change for his insulin. Did confirm what he is currently taking and is what was on his list. Updated medication list  Faxed order to [facility]

## 2022-03-28 ENCOUNTER — Other Ambulatory Visit: Payer: Self-pay | Admitting: Family Medicine

## 2022-03-28 DIAGNOSIS — B353 Tinea pedis: Secondary | ICD-10-CM

## 2022-04-20 ENCOUNTER — Other Ambulatory Visit: Payer: Self-pay | Admitting: Family Medicine

## 2022-04-20 DIAGNOSIS — U071 COVID-19: Secondary | ICD-10-CM

## 2022-04-28 ENCOUNTER — Other Ambulatory Visit: Payer: Self-pay | Admitting: Family Medicine

## 2022-04-28 DIAGNOSIS — E1165 Type 2 diabetes mellitus with hyperglycemia: Secondary | ICD-10-CM

## 2022-07-12 ENCOUNTER — Other Ambulatory Visit: Payer: Self-pay | Admitting: Family Medicine

## 2022-07-12 DIAGNOSIS — M722 Plantar fascial fibromatosis: Secondary | ICD-10-CM

## 2022-07-12 DIAGNOSIS — Z794 Long term (current) use of insulin: Secondary | ICD-10-CM

## 2022-07-12 MED ORDER — MELOXICAM 7.5 MG PO TABS: 7.5000 mg | ORAL_TABLET | Freq: Every day | ORAL | 1 refills | Status: DC | PRN

## 2022-07-22 ENCOUNTER — Other Ambulatory Visit: Payer: Self-pay | Admitting: Family Medicine

## 2022-07-22 DIAGNOSIS — U071 COVID-19: Secondary | ICD-10-CM

## 2022-08-04 ENCOUNTER — Telehealth: Payer: Self-pay | Admitting: Family Medicine

## 2022-08-04 MED ORDER — LISINOPRIL 10 MG PO TABS: 10.0000 mg | ORAL_TABLET | Freq: Every day | ORAL | 3 refills | Status: DC

## 2022-08-04 NOTE — Telephone Encounter (Signed)
Refill done and patients sister made aware

## 2022-08-04 NOTE — Telephone Encounter (Signed)
Pt's sister called and stated he does not have enough medication to last until his f/u so she is hoping to get a refill before then.   lisinopril (ZESTRIL) 10 MG tablet  [id number] - [place], [place] - [address] AT [facility] OF [address], [place] [address] Phone: [phone]  Fax: [phone]

## 2022-08-09 ENCOUNTER — Ambulatory Visit (INDEPENDENT_AMBULATORY_CARE_PROVIDER_SITE_OTHER): Payer: Medicare Other | Admitting: Family Medicine

## 2022-08-09 ENCOUNTER — Encounter: Payer: Self-pay | Admitting: Family Medicine

## 2022-08-09 VITALS — BP 118/80 | HR 87 | Temp 97.6°F | Ht 71.0 in | Wt 199.0 lb

## 2022-08-09 DIAGNOSIS — B353 Tinea pedis: Secondary | ICD-10-CM | POA: Diagnosis not present

## 2022-08-09 DIAGNOSIS — E785 Hyperlipidemia, unspecified: Secondary | ICD-10-CM

## 2022-08-09 DIAGNOSIS — I10 Essential (primary) hypertension: Secondary | ICD-10-CM | POA: Insufficient documentation

## 2022-08-09 DIAGNOSIS — E1165 Type 2 diabetes mellitus with hyperglycemia: Secondary | ICD-10-CM | POA: Diagnosis not present

## 2022-08-09 DIAGNOSIS — Z794 Long term (current) use of insulin: Secondary | ICD-10-CM | POA: Diagnosis not present

## 2022-08-09 DIAGNOSIS — R5381 Other malaise: Secondary | ICD-10-CM

## 2022-08-09 LAB — COMPREHENSIVE METABOLIC PANEL
ALT: 23 U/L (ref 0–53)
AST: 21 U/L (ref 0–37)
Albumin: 4.1 g/dL (ref 3.5–5.2)
Alkaline Phosphatase: 78 U/L (ref 39–117)
BUN: 23 mg/dL (ref 6–23)
CO2: 21 mEq/L (ref 19–32)
Calcium: 9.6 mg/dL (ref 8.4–10.5)
Chloride: 108 mEq/L (ref 96–112)
Creatinine, Ser: 1.25 mg/dL (ref 0.40–1.50)
GFR: 56.91 mL/min — ABNORMAL LOW (ref >60.00)
Glucose, Bld: 164 mg/dL — ABNORMAL HIGH (ref 70–99)
Potassium: 4.7 mEq/L (ref 3.5–5.1)
Sodium: 141 mEq/L (ref 135–145)
Total Bilirubin: 0.4 mg/dL (ref 0.2–1.2)
Total Protein: 7.2 g/dL (ref 6.0–8.3)

## 2022-08-09 LAB — MICROALBUMIN / CREATININE URINE RATIO
Creatinine,U: 245.7 mg/dL
Microalb Creat Ratio: 2.1 mg/g (ref 0.0–30.0)
Microalb, Ur: 5.1 mg/dL — ABNORMAL HIGH (ref 0.0–1.9)

## 2022-08-09 LAB — HEMOGLOBIN A1C: Hgb A1c MFr Bld: 7.2 % — ABNORMAL HIGH (ref 4.6–6.5)

## 2022-08-09 LAB — LIPID PANEL
Cholesterol: 68 mg/dL (ref 0–200)
HDL: 31.8 mg/dL — ABNORMAL LOW (ref >39.00)
LDL Cholesterol: 21 mg/dL (ref 0–99)
NonHDL: 36.58
Total CHOL/HDL Ratio: 2
Triglycerides: 80 mg/dL (ref 0.0–149.0)
VLDL: 16 mg/dL (ref 0.0–40.0)

## 2022-08-09 MED ORDER — METFORMIN HCL 500 MG PO TABS: ORAL_TABLET | ORAL | 3 refills | Status: DC

## 2022-08-09 MED ORDER — KETOCONAZOLE 2 % EX CREA: TOPICAL_CREAM | CUTANEOUS | 0 refills | Status: DC

## 2022-08-09 MED ORDER — AMLODIPINE BESYLATE 5 MG PO TABS: 5.0000 mg | ORAL_TABLET | Freq: Every day | ORAL | 3 refills | Status: DC

## 2022-08-09 MED ORDER — ATORVASTATIN CALCIUM 40 MG PO TABS: 40.0000 mg | ORAL_TABLET | Freq: Every day | ORAL | 3 refills | Status: DC

## 2022-08-09 NOTE — Progress Notes (Signed)
Subjective:   Chief Complaint  Patient presents with   Follow-up    Discuss  cholesterol    Roosvelt Churchwell is a 72 y.o. male here for follow-up of diabetes.   Laban's self monitored glucose range is mid 100's.  Patient denies hypoglycemic reactions. He checks his glucose levels 1 time(s) per day. Patient does require insulin. Novolog 70/30 16 u in AM and 8 u in PM Medications include: metformin 1000 mg bid Diet is OK.  Exercise: walking  Hypertension Patient presents for hypertension follow up. He does not monitor home blood pressures. He is compliant with medications- lisinopril 10 mg/d, Norvasc 5 mg/d. Patient has these side effects of medication: none Diet/exercise as above. No CP or SOB.   Dyslipidemia Patient presents for dyslipidemia follow up. Currently being treated with Lipitor 40 mg/d and compliance with treatment thus far has been good. He denies myalgias. Diet/exercise as above.  The patient is not known to have coexisting coronary artery disease.  Past Medical History:  Diagnosis Date   Allergic rhinitis    Allergy    Arthritis    shoulder   Cataract    both eyes - just watching now   Diabetes mellitus without complication (HCC)    History of chicken pox    History of lead poisoning    Hyperlipidemia    Mild cognitive impairment    Mumps    Uses walker    ambulates with walker     Related testing: Retinal exam: Done Pneumovax: done  Objective:  BP 118/80 (BP Location: Right Arm, Patient Position: Sitting, Cuff Size: Normal)   Pulse 87   Temp 97.6 F (36.4 C) (Oral)   Ht 5\' 11"  (1.803 m)   Wt 199 lb (90.3 kg)   SpO2 93%   BMI 27.75 kg/m  General:  Well developed, well nourished, in no apparent distress Skin:  Warm, no pallor or diaphoresis Head:  Normocephalic, atraumatic Eyes:  Pupils equal and round, sclera anicteric without injection  Lungs:  CTAB, no access msc use Cardio:  RRR, no bruits, no LE edema Musculoskeletal:   Symmetrical muscle groups noted without atrophy or deformity Neuro:  Sensation intact to pinprick on feet Psych: Nml affect/mood  Assessment:   Type 2 diabetes mellitus with hyperglycemia, with long-term current use of insulin (HCC) - Plan: Comprehensive metabolic panel, Lipid panel, Hemoglobin A1c, Microalbumin / creatinine urine ratio, atorvastatin (LIPITOR) 40 MG tablet, metFORMIN (GLUCOPHAGE) 500 MG tablet  Essential hypertension - Plan: amLODipine (NORVASC) 5 MG tablet  Dyslipidemia  Tinea pedis of both feet - Plan: ketoconazole (NIZORAL) 2 % cream   Plan:   Chronic, stable. Cont Novolog 70-30 16 u in AM, 8 u in PM, metformin 1000 mg bid. Counseled on diet and exercise. Chronic, stable. Cont Norvasc 5 mg/d, lisinopril 10 mg/d.  Chronic, stable. Cont Lipitor 40 mg/d.  Gets recurrent TP infections. Will refill should he need in future.  F/u in 6 mo. The patient voiced understanding and agreement to the plan.  Jilda Roche [place], DO 08/09/22 10:40 AM

## 2022-08-09 NOTE — Patient Instructions (Signed)
Give us 2-3 business days to get the results of your labs back.   Keep the diet clean and stay active.  If you do not hear anything about your referral in the next 1-2 weeks, call our office and ask for an update.  Let us know if you need anything. 

## 2022-08-14 DIAGNOSIS — I10 Essential (primary) hypertension: Secondary | ICD-10-CM | POA: Diagnosis not present

## 2022-08-14 DIAGNOSIS — E785 Hyperlipidemia, unspecified: Secondary | ICD-10-CM | POA: Diagnosis not present

## 2022-08-14 DIAGNOSIS — Z7984 Long term (current) use of oral hypoglycemic drugs: Secondary | ICD-10-CM | POA: Diagnosis not present

## 2022-08-14 DIAGNOSIS — B353 Tinea pedis: Secondary | ICD-10-CM | POA: Diagnosis not present

## 2022-08-14 DIAGNOSIS — E1165 Type 2 diabetes mellitus with hyperglycemia: Secondary | ICD-10-CM | POA: Diagnosis not present

## 2022-08-14 DIAGNOSIS — Z7982 Long term (current) use of aspirin: Secondary | ICD-10-CM | POA: Diagnosis not present

## 2022-08-14 DIAGNOSIS — Z79899 Other long term (current) drug therapy: Secondary | ICD-10-CM | POA: Diagnosis not present

## 2022-08-14 DIAGNOSIS — G3184 Mild cognitive impairment, so stated: Secondary | ICD-10-CM | POA: Diagnosis not present

## 2022-08-14 DIAGNOSIS — Z794 Long term (current) use of insulin: Secondary | ICD-10-CM | POA: Diagnosis not present

## 2022-08-16 ENCOUNTER — Telehealth: Payer: Self-pay | Admitting: Family Medicine

## 2022-08-16 NOTE — Telephone Encounter (Signed)
Called left detailed message of PCP verbal ok. 

## 2022-08-16 NOTE — Telephone Encounter (Signed)
Caller/Agency: Judie Grieve ([facility]) Callback Number: [phone], Ok to [facility] Requesting OT/PT/Skilled Nursing/Social Work/Speech Therapy: PT Frequency: 1 w 8

## 2022-08-18 DIAGNOSIS — E1165 Type 2 diabetes mellitus with hyperglycemia: Secondary | ICD-10-CM | POA: Diagnosis not present

## 2022-08-18 DIAGNOSIS — I10 Essential (primary) hypertension: Secondary | ICD-10-CM | POA: Diagnosis not present

## 2022-08-18 DIAGNOSIS — Z794 Long term (current) use of insulin: Secondary | ICD-10-CM | POA: Diagnosis not present

## 2022-08-18 DIAGNOSIS — G3184 Mild cognitive impairment, so stated: Secondary | ICD-10-CM | POA: Diagnosis not present

## 2022-08-18 DIAGNOSIS — E785 Hyperlipidemia, unspecified: Secondary | ICD-10-CM | POA: Diagnosis not present

## 2022-08-18 DIAGNOSIS — B353 Tinea pedis: Secondary | ICD-10-CM | POA: Diagnosis not present

## 2022-08-23 DIAGNOSIS — E785 Hyperlipidemia, unspecified: Secondary | ICD-10-CM | POA: Diagnosis not present

## 2022-08-23 DIAGNOSIS — B353 Tinea pedis: Secondary | ICD-10-CM | POA: Diagnosis not present

## 2022-08-23 DIAGNOSIS — G3184 Mild cognitive impairment, so stated: Secondary | ICD-10-CM | POA: Diagnosis not present

## 2022-08-23 DIAGNOSIS — E1165 Type 2 diabetes mellitus with hyperglycemia: Secondary | ICD-10-CM | POA: Diagnosis not present

## 2022-08-23 DIAGNOSIS — I10 Essential (primary) hypertension: Secondary | ICD-10-CM | POA: Diagnosis not present

## 2022-08-23 DIAGNOSIS — Z794 Long term (current) use of insulin: Secondary | ICD-10-CM | POA: Diagnosis not present

## 2022-08-29 ENCOUNTER — Other Ambulatory Visit: Payer: Self-pay | Admitting: Family Medicine

## 2022-08-29 DIAGNOSIS — I10 Essential (primary) hypertension: Secondary | ICD-10-CM

## 2022-08-30 DIAGNOSIS — G3184 Mild cognitive impairment, so stated: Secondary | ICD-10-CM | POA: Diagnosis not present

## 2022-08-30 DIAGNOSIS — B353 Tinea pedis: Secondary | ICD-10-CM | POA: Diagnosis not present

## 2022-08-30 DIAGNOSIS — E1165 Type 2 diabetes mellitus with hyperglycemia: Secondary | ICD-10-CM | POA: Diagnosis not present

## 2022-08-30 DIAGNOSIS — E785 Hyperlipidemia, unspecified: Secondary | ICD-10-CM | POA: Diagnosis not present

## 2022-08-30 DIAGNOSIS — Z794 Long term (current) use of insulin: Secondary | ICD-10-CM | POA: Diagnosis not present

## 2022-08-30 DIAGNOSIS — I10 Essential (primary) hypertension: Secondary | ICD-10-CM | POA: Diagnosis not present

## 2022-09-06 ENCOUNTER — Other Ambulatory Visit: Payer: Self-pay | Admitting: Family Medicine

## 2022-09-06 DIAGNOSIS — M722 Plantar fascial fibromatosis: Secondary | ICD-10-CM

## 2022-09-06 MED ORDER — MELOXICAM 7.5 MG PO TABS: 7.5000 mg | ORAL_TABLET | Freq: Every day | ORAL | 1 refills | Status: DC | PRN

## 2022-09-10 DIAGNOSIS — E785 Hyperlipidemia, unspecified: Secondary | ICD-10-CM | POA: Diagnosis not present

## 2022-09-10 DIAGNOSIS — Z794 Long term (current) use of insulin: Secondary | ICD-10-CM | POA: Diagnosis not present

## 2022-09-10 DIAGNOSIS — G3184 Mild cognitive impairment, so stated: Secondary | ICD-10-CM | POA: Diagnosis not present

## 2022-09-10 DIAGNOSIS — B353 Tinea pedis: Secondary | ICD-10-CM | POA: Diagnosis not present

## 2022-09-10 DIAGNOSIS — E1165 Type 2 diabetes mellitus with hyperglycemia: Secondary | ICD-10-CM | POA: Diagnosis not present

## 2022-09-10 DIAGNOSIS — I10 Essential (primary) hypertension: Secondary | ICD-10-CM | POA: Diagnosis not present

## 2022-09-13 DIAGNOSIS — I10 Essential (primary) hypertension: Secondary | ICD-10-CM | POA: Diagnosis not present

## 2022-09-13 DIAGNOSIS — E785 Hyperlipidemia, unspecified: Secondary | ICD-10-CM | POA: Diagnosis not present

## 2022-09-13 DIAGNOSIS — Z7982 Long term (current) use of aspirin: Secondary | ICD-10-CM | POA: Diagnosis not present

## 2022-09-13 DIAGNOSIS — Z794 Long term (current) use of insulin: Secondary | ICD-10-CM | POA: Diagnosis not present

## 2022-09-13 DIAGNOSIS — E1165 Type 2 diabetes mellitus with hyperglycemia: Secondary | ICD-10-CM | POA: Diagnosis not present

## 2022-09-13 DIAGNOSIS — B353 Tinea pedis: Secondary | ICD-10-CM | POA: Diagnosis not present

## 2022-09-13 DIAGNOSIS — Z7984 Long term (current) use of oral hypoglycemic drugs: Secondary | ICD-10-CM | POA: Diagnosis not present

## 2022-09-13 DIAGNOSIS — G3184 Mild cognitive impairment, so stated: Secondary | ICD-10-CM | POA: Diagnosis not present

## 2022-09-13 DIAGNOSIS — Z79899 Other long term (current) drug therapy: Secondary | ICD-10-CM | POA: Diagnosis not present

## 2022-09-15 DIAGNOSIS — E785 Hyperlipidemia, unspecified: Secondary | ICD-10-CM | POA: Diagnosis not present

## 2022-09-15 DIAGNOSIS — Z794 Long term (current) use of insulin: Secondary | ICD-10-CM | POA: Diagnosis not present

## 2022-09-15 DIAGNOSIS — G3184 Mild cognitive impairment, so stated: Secondary | ICD-10-CM | POA: Diagnosis not present

## 2022-09-15 DIAGNOSIS — I10 Essential (primary) hypertension: Secondary | ICD-10-CM | POA: Diagnosis not present

## 2022-09-15 DIAGNOSIS — B353 Tinea pedis: Secondary | ICD-10-CM | POA: Diagnosis not present

## 2022-09-15 DIAGNOSIS — E1165 Type 2 diabetes mellitus with hyperglycemia: Secondary | ICD-10-CM | POA: Diagnosis not present

## 2022-09-20 ENCOUNTER — Encounter: Payer: Self-pay | Admitting: Family Medicine

## 2022-09-20 ENCOUNTER — Ambulatory Visit (INDEPENDENT_AMBULATORY_CARE_PROVIDER_SITE_OTHER): Payer: Medicare Other | Admitting: Family Medicine

## 2022-09-20 VITALS — BP 130/82 | HR 86 | Temp 98.2°F | Ht 71.0 in | Wt 201.5 lb

## 2022-09-20 DIAGNOSIS — R350 Frequency of micturition: Secondary | ICD-10-CM | POA: Diagnosis not present

## 2022-09-20 DIAGNOSIS — N401 Enlarged prostate with lower urinary tract symptoms: Secondary | ICD-10-CM

## 2022-09-20 DIAGNOSIS — R351 Nocturia: Secondary | ICD-10-CM

## 2022-09-20 LAB — POC URINALSYSI DIPSTICK (AUTOMATED)
Bilirubin, UA: NEGATIVE
Glucose, UA: NEGATIVE
Ketones, UA: NEGATIVE
Leukocytes, UA: NEGATIVE
Nitrite, UA: NEGATIVE
Protein, UA: POSITIVE — AB
Spec Grav, UA: 1.02 (ref 1.010–1.025)
Urobilinogen, UA: 0.2 E.U./dL
pH, UA: 5 (ref 5.0–8.0)

## 2022-09-20 MED ORDER — TAMSULOSIN HCL 0.4 MG PO CAPS: 0.4000 mg | ORAL_CAPSULE | Freq: Every day | ORAL | 3 refills | Status: DC

## 2022-09-20 NOTE — Progress Notes (Signed)
poct

## 2022-09-20 NOTE — Progress Notes (Addendum)
Chief Complaint  Patient presents with   Urinary Frequency    Subjective Phillip Richard is a 72 y.o. male who presents with urinary concerns Symptoms began 2-3 mo ago. Symptoms include nocturia 3x/night. He denies hematuria, urinary infection, dizziness, constipation, bladder stones, and renal insufficiency. He reports no history of prostate cancer.  Past Medical History:  Diagnosis Date   Allergic rhinitis    Allergy    Arthritis    shoulder   Cataract    both eyes - just watching now   Diabetes mellitus without complication (HCC)    History of chicken pox    History of lead poisoning    Hyperlipidemia    Mild cognitive impairment    Mumps    Uses walker    ambulates with walker     Exam BP 130/82 (BP Location: Left Arm, Cuff Size: Normal)   Pulse 86   Temp 98.2 F (36.8 C) (Oral)   Ht 5\' 11"  (1.803 m)   Wt 201 lb 8 oz (91.4 kg)   SpO2 93%   BMI 28.10 kg/m  General:  well developed, well nourished, in no apparent distress Lungs:  clear to auscultation, breath sounds equal bilaterally; normal effort without accessory muscle use Cardio:  regular rate and rhythm Abd: BS+, S, NT, ND Psych: well oriented with normal range of affect and appropriate judgement/insight  Assessment and Plan  Benign prostatic hyperplasia with nocturia  Frequent urination - Plan: POCT Urinalysis Dipstick (Automated), Urine Culture, Urine Microscopic Only  Chronic, not controlled. Possible hematuria, will ck culture and micro. Start Flomax 0.4 mg/d. No fluids w/in 90 min of bedtime. Warned about lightheadedness when getting up. He has none of this now and will be sure to get up slowly. F/u in 1 mo.  The patient voiced understanding and agreement to the plan.  Jilda Roche [place], DO 09/20/22 2:24 PM

## 2022-09-20 NOTE — Patient Instructions (Addendum)
No water/fluids 90 min before bed.   Let us know if you need anything.

## 2022-09-21 ENCOUNTER — Other Ambulatory Visit: Payer: Self-pay | Admitting: Family Medicine

## 2022-09-21 DIAGNOSIS — R350 Frequency of micturition: Secondary | ICD-10-CM

## 2022-09-21 LAB — URINALYSIS, MICROSCOPIC ONLY

## 2022-09-21 LAB — URINE CULTURE
MICRO NUMBER:: 15229316
SPECIMEN QUALITY:: ADEQUATE

## 2022-09-22 DIAGNOSIS — Z794 Long term (current) use of insulin: Secondary | ICD-10-CM | POA: Diagnosis not present

## 2022-09-22 DIAGNOSIS — I10 Essential (primary) hypertension: Secondary | ICD-10-CM | POA: Diagnosis not present

## 2022-09-22 DIAGNOSIS — E1165 Type 2 diabetes mellitus with hyperglycemia: Secondary | ICD-10-CM | POA: Diagnosis not present

## 2022-09-22 DIAGNOSIS — G3184 Mild cognitive impairment, so stated: Secondary | ICD-10-CM | POA: Diagnosis not present

## 2022-09-22 DIAGNOSIS — B353 Tinea pedis: Secondary | ICD-10-CM | POA: Diagnosis not present

## 2022-09-22 DIAGNOSIS — E785 Hyperlipidemia, unspecified: Secondary | ICD-10-CM | POA: Diagnosis not present

## 2022-09-29 ENCOUNTER — Encounter (INDEPENDENT_AMBULATORY_CARE_PROVIDER_SITE_OTHER): Payer: Self-pay

## 2022-09-29 DIAGNOSIS — I10 Essential (primary) hypertension: Secondary | ICD-10-CM | POA: Diagnosis not present

## 2022-09-29 DIAGNOSIS — Z794 Long term (current) use of insulin: Secondary | ICD-10-CM | POA: Diagnosis not present

## 2022-09-29 DIAGNOSIS — G3184 Mild cognitive impairment, so stated: Secondary | ICD-10-CM | POA: Diagnosis not present

## 2022-09-29 DIAGNOSIS — E1165 Type 2 diabetes mellitus with hyperglycemia: Secondary | ICD-10-CM | POA: Diagnosis not present

## 2022-09-29 DIAGNOSIS — E785 Hyperlipidemia, unspecified: Secondary | ICD-10-CM | POA: Diagnosis not present

## 2022-09-29 DIAGNOSIS — B353 Tinea pedis: Secondary | ICD-10-CM | POA: Diagnosis not present

## 2022-10-05 ENCOUNTER — Other Ambulatory Visit (INDEPENDENT_AMBULATORY_CARE_PROVIDER_SITE_OTHER): Payer: Medicare Other

## 2022-10-05 ENCOUNTER — Other Ambulatory Visit: Payer: Self-pay | Admitting: Family Medicine

## 2022-10-05 DIAGNOSIS — R350 Frequency of micturition: Secondary | ICD-10-CM

## 2022-10-05 DIAGNOSIS — R3129 Other microscopic hematuria: Secondary | ICD-10-CM

## 2022-10-05 LAB — URINALYSIS, MICROSCOPIC ONLY

## 2022-10-08 DIAGNOSIS — I10 Essential (primary) hypertension: Secondary | ICD-10-CM | POA: Diagnosis not present

## 2022-10-08 DIAGNOSIS — E1165 Type 2 diabetes mellitus with hyperglycemia: Secondary | ICD-10-CM | POA: Diagnosis not present

## 2022-10-08 DIAGNOSIS — E785 Hyperlipidemia, unspecified: Secondary | ICD-10-CM | POA: Diagnosis not present

## 2022-10-08 DIAGNOSIS — G3184 Mild cognitive impairment, so stated: Secondary | ICD-10-CM | POA: Diagnosis not present

## 2022-10-08 DIAGNOSIS — Z794 Long term (current) use of insulin: Secondary | ICD-10-CM | POA: Diagnosis not present

## 2022-10-08 DIAGNOSIS — B353 Tinea pedis: Secondary | ICD-10-CM | POA: Diagnosis not present

## 2022-10-13 DIAGNOSIS — E1165 Type 2 diabetes mellitus with hyperglycemia: Secondary | ICD-10-CM | POA: Diagnosis not present

## 2022-10-13 DIAGNOSIS — Z7984 Long term (current) use of oral hypoglycemic drugs: Secondary | ICD-10-CM | POA: Diagnosis not present

## 2022-10-13 DIAGNOSIS — G3184 Mild cognitive impairment, so stated: Secondary | ICD-10-CM | POA: Diagnosis not present

## 2022-10-13 DIAGNOSIS — Z7982 Long term (current) use of aspirin: Secondary | ICD-10-CM | POA: Diagnosis not present

## 2022-10-13 DIAGNOSIS — Z794 Long term (current) use of insulin: Secondary | ICD-10-CM | POA: Diagnosis not present

## 2022-10-13 DIAGNOSIS — I10 Essential (primary) hypertension: Secondary | ICD-10-CM | POA: Diagnosis not present

## 2022-10-13 DIAGNOSIS — Z79899 Other long term (current) drug therapy: Secondary | ICD-10-CM | POA: Diagnosis not present

## 2022-10-13 DIAGNOSIS — E785 Hyperlipidemia, unspecified: Secondary | ICD-10-CM | POA: Diagnosis not present

## 2022-10-13 DIAGNOSIS — B353 Tinea pedis: Secondary | ICD-10-CM | POA: Diagnosis not present

## 2022-10-15 ENCOUNTER — Other Ambulatory Visit: Payer: Self-pay | Admitting: Family Medicine

## 2022-10-15 DIAGNOSIS — U071 COVID-19: Secondary | ICD-10-CM

## 2022-10-18 DIAGNOSIS — I10 Essential (primary) hypertension: Secondary | ICD-10-CM | POA: Diagnosis not present

## 2022-10-18 DIAGNOSIS — B353 Tinea pedis: Secondary | ICD-10-CM | POA: Diagnosis not present

## 2022-10-18 DIAGNOSIS — G3184 Mild cognitive impairment, so stated: Secondary | ICD-10-CM | POA: Diagnosis not present

## 2022-10-18 DIAGNOSIS — E785 Hyperlipidemia, unspecified: Secondary | ICD-10-CM | POA: Diagnosis not present

## 2022-10-18 DIAGNOSIS — E1165 Type 2 diabetes mellitus with hyperglycemia: Secondary | ICD-10-CM | POA: Diagnosis not present

## 2022-10-18 DIAGNOSIS — Z794 Long term (current) use of insulin: Secondary | ICD-10-CM | POA: Diagnosis not present

## 2022-10-21 ENCOUNTER — Ambulatory Visit (INDEPENDENT_AMBULATORY_CARE_PROVIDER_SITE_OTHER): Payer: Medicare Other | Admitting: Family Medicine

## 2022-10-21 ENCOUNTER — Encounter: Payer: Self-pay | Admitting: Family Medicine

## 2022-10-21 VITALS — BP 110/79 | HR 58 | Temp 97.8°F | Ht 71.0 in | Wt 201.1 lb

## 2022-10-21 DIAGNOSIS — R351 Nocturia: Secondary | ICD-10-CM

## 2022-10-21 DIAGNOSIS — N401 Enlarged prostate with lower urinary tract symptoms: Secondary | ICD-10-CM

## 2022-10-21 NOTE — Patient Instructions (Addendum)
Let us know if/when you need refills.  I recommend getting the flu shot in mid October. This suggestion would change if the CDC comes out with a different recommendation.   Let us know if you need anything.

## 2022-10-21 NOTE — Progress Notes (Signed)
Chief Complaint  Patient presents with   Follow-up   Subjective Phillip Richard is a 72 y.o. male who presents with urinary concerns Symptoms began 3-4 mo ago. Started on Flomax 0.4 mg/d at last visit.  Tolerating well, no AE's.  Symptoms now include urinating 1x/night on average.  No longer having incontinence issues.  He denies hematuria, pain, dc, constipation.  Past Medical History:  Diagnosis Date   Allergic rhinitis    Allergy    Arthritis    shoulder   Cataract    both eyes - just watching now   Diabetes mellitus without complication (HCC)    History of chicken pox    History of lead poisoning    Hyperlipidemia    Mild cognitive impairment    Mumps    Uses walker    ambulates with walker     Exam BP 110/79 (BP Location: Left Arm, Patient Position: Sitting, Cuff Size: Normal)   Pulse (!) 58   Temp 97.8 F (36.6 C) (Oral)   Ht 5\' 11"  (1.803 m)   Wt 201 lb 2 oz (91.2 kg)   SpO2 94%   BMI 28.05 kg/m  General:  well developed, well nourished, in no apparent distress Lungs:  clear to auscultation, breath sounds equal bilaterally; normal effort without accessory muscle use Cardio:  regular rhythm, bradycardic Abd: BS+, S, NT, ND Psych: well oriented with normal range of affect and appropriate judgement/insight  Assessment and Plan  Benign prostatic hyperplasia with nocturia  Chronic, stable. Cont Flomax 0.4 mg/d.  Follow up in 3.5 mo The patient voiced understanding and agreement to the plan.  Jilda Roche [place], DO 10/21/22 11:47 AM

## 2022-10-22 ENCOUNTER — Telehealth: Payer: Self-pay | Admitting: Family Medicine

## 2022-10-22 NOTE — Telephone Encounter (Signed)
Copied from CRM [id number]. Topic: Medicare AWV >> Oct 22, 2022 11:33 AM Payton Doughty wrote: Reason for CRM: LM 10/22/2022 to schedule AWV   Verlee Rossetti; Care Guide Ambulatory Clinical Support [place] [facility] Direct Dial: [phone]

## 2022-10-29 DIAGNOSIS — G3184 Mild cognitive impairment, so stated: Secondary | ICD-10-CM | POA: Diagnosis not present

## 2022-10-29 DIAGNOSIS — Z794 Long term (current) use of insulin: Secondary | ICD-10-CM | POA: Diagnosis not present

## 2022-10-29 DIAGNOSIS — E785 Hyperlipidemia, unspecified: Secondary | ICD-10-CM | POA: Diagnosis not present

## 2022-10-29 DIAGNOSIS — I10 Essential (primary) hypertension: Secondary | ICD-10-CM | POA: Diagnosis not present

## 2022-10-29 DIAGNOSIS — E1165 Type 2 diabetes mellitus with hyperglycemia: Secondary | ICD-10-CM | POA: Diagnosis not present

## 2022-10-29 DIAGNOSIS — B353 Tinea pedis: Secondary | ICD-10-CM | POA: Diagnosis not present

## 2022-11-03 DIAGNOSIS — G3184 Mild cognitive impairment, so stated: Secondary | ICD-10-CM | POA: Diagnosis not present

## 2022-11-03 DIAGNOSIS — E1165 Type 2 diabetes mellitus with hyperglycemia: Secondary | ICD-10-CM | POA: Diagnosis not present

## 2022-11-03 DIAGNOSIS — E785 Hyperlipidemia, unspecified: Secondary | ICD-10-CM | POA: Diagnosis not present

## 2022-11-03 DIAGNOSIS — B353 Tinea pedis: Secondary | ICD-10-CM | POA: Diagnosis not present

## 2022-11-03 DIAGNOSIS — I10 Essential (primary) hypertension: Secondary | ICD-10-CM | POA: Diagnosis not present

## 2022-11-03 DIAGNOSIS — Z794 Long term (current) use of insulin: Secondary | ICD-10-CM | POA: Diagnosis not present

## 2022-11-08 ENCOUNTER — Other Ambulatory Visit: Payer: Self-pay | Admitting: Family Medicine

## 2022-11-08 DIAGNOSIS — B353 Tinea pedis: Secondary | ICD-10-CM | POA: Diagnosis not present

## 2022-11-08 DIAGNOSIS — Z794 Long term (current) use of insulin: Secondary | ICD-10-CM | POA: Diagnosis not present

## 2022-11-08 DIAGNOSIS — E1165 Type 2 diabetes mellitus with hyperglycemia: Secondary | ICD-10-CM | POA: Diagnosis not present

## 2022-11-08 DIAGNOSIS — I10 Essential (primary) hypertension: Secondary | ICD-10-CM | POA: Diagnosis not present

## 2022-11-08 DIAGNOSIS — E785 Hyperlipidemia, unspecified: Secondary | ICD-10-CM | POA: Diagnosis not present

## 2022-11-08 DIAGNOSIS — G3184 Mild cognitive impairment, so stated: Secondary | ICD-10-CM | POA: Diagnosis not present

## 2022-11-08 MED ORDER — METFORMIN HCL 500 MG PO TABS: ORAL_TABLET | ORAL | 3 refills | Status: DC

## 2022-11-12 DIAGNOSIS — Z7984 Long term (current) use of oral hypoglycemic drugs: Secondary | ICD-10-CM | POA: Diagnosis not present

## 2022-11-12 DIAGNOSIS — Z79899 Other long term (current) drug therapy: Secondary | ICD-10-CM | POA: Diagnosis not present

## 2022-11-12 DIAGNOSIS — I10 Essential (primary) hypertension: Secondary | ICD-10-CM | POA: Diagnosis not present

## 2022-11-12 DIAGNOSIS — G3184 Mild cognitive impairment, so stated: Secondary | ICD-10-CM | POA: Diagnosis not present

## 2022-11-12 DIAGNOSIS — Z7982 Long term (current) use of aspirin: Secondary | ICD-10-CM | POA: Diagnosis not present

## 2022-11-12 DIAGNOSIS — B353 Tinea pedis: Secondary | ICD-10-CM | POA: Diagnosis not present

## 2022-11-12 DIAGNOSIS — E1165 Type 2 diabetes mellitus with hyperglycemia: Secondary | ICD-10-CM | POA: Diagnosis not present

## 2022-11-12 DIAGNOSIS — Z794 Long term (current) use of insulin: Secondary | ICD-10-CM | POA: Diagnosis not present

## 2022-11-12 DIAGNOSIS — E785 Hyperlipidemia, unspecified: Secondary | ICD-10-CM | POA: Diagnosis not present

## 2022-11-15 DIAGNOSIS — B353 Tinea pedis: Secondary | ICD-10-CM | POA: Diagnosis not present

## 2022-11-15 DIAGNOSIS — G3184 Mild cognitive impairment, so stated: Secondary | ICD-10-CM | POA: Diagnosis not present

## 2022-11-15 DIAGNOSIS — E1165 Type 2 diabetes mellitus with hyperglycemia: Secondary | ICD-10-CM | POA: Diagnosis not present

## 2022-11-15 DIAGNOSIS — E785 Hyperlipidemia, unspecified: Secondary | ICD-10-CM | POA: Diagnosis not present

## 2022-11-15 DIAGNOSIS — Z794 Long term (current) use of insulin: Secondary | ICD-10-CM | POA: Diagnosis not present

## 2022-11-15 DIAGNOSIS — I10 Essential (primary) hypertension: Secondary | ICD-10-CM | POA: Diagnosis not present

## 2022-11-19 ENCOUNTER — Other Ambulatory Visit: Payer: Self-pay | Admitting: Family Medicine

## 2022-11-19 DIAGNOSIS — L84 Corns and callosities: Secondary | ICD-10-CM | POA: Diagnosis not present

## 2022-11-19 DIAGNOSIS — B351 Tinea unguium: Secondary | ICD-10-CM | POA: Diagnosis not present

## 2022-11-19 DIAGNOSIS — E119 Type 2 diabetes mellitus without complications: Secondary | ICD-10-CM | POA: Diagnosis not present

## 2022-11-19 DIAGNOSIS — M722 Plantar fascial fibromatosis: Secondary | ICD-10-CM

## 2022-11-19 DIAGNOSIS — M79675 Pain in left toe(s): Secondary | ICD-10-CM | POA: Diagnosis not present

## 2022-11-19 DIAGNOSIS — M2041 Other hammer toe(s) (acquired), right foot: Secondary | ICD-10-CM | POA: Diagnosis not present

## 2022-11-19 MED ORDER — MELOXICAM 7.5 MG PO TABS: 7.5000 mg | ORAL_TABLET | Freq: Every day | ORAL | 1 refills | Status: DC | PRN

## 2022-11-22 DIAGNOSIS — B353 Tinea pedis: Secondary | ICD-10-CM | POA: Diagnosis not present

## 2022-11-22 DIAGNOSIS — I10 Essential (primary) hypertension: Secondary | ICD-10-CM | POA: Diagnosis not present

## 2022-11-22 DIAGNOSIS — E1165 Type 2 diabetes mellitus with hyperglycemia: Secondary | ICD-10-CM | POA: Diagnosis not present

## 2022-11-22 DIAGNOSIS — G3184 Mild cognitive impairment, so stated: Secondary | ICD-10-CM | POA: Diagnosis not present

## 2022-11-22 DIAGNOSIS — E785 Hyperlipidemia, unspecified: Secondary | ICD-10-CM | POA: Diagnosis not present

## 2022-11-22 DIAGNOSIS — Z794 Long term (current) use of insulin: Secondary | ICD-10-CM | POA: Diagnosis not present

## 2022-11-23 DIAGNOSIS — K048 Radicular cyst: Secondary | ICD-10-CM | POA: Diagnosis not present

## 2022-12-01 DIAGNOSIS — E1165 Type 2 diabetes mellitus with hyperglycemia: Secondary | ICD-10-CM | POA: Diagnosis not present

## 2022-12-01 DIAGNOSIS — B353 Tinea pedis: Secondary | ICD-10-CM | POA: Diagnosis not present

## 2022-12-01 DIAGNOSIS — Z794 Long term (current) use of insulin: Secondary | ICD-10-CM | POA: Diagnosis not present

## 2022-12-01 DIAGNOSIS — I10 Essential (primary) hypertension: Secondary | ICD-10-CM | POA: Diagnosis not present

## 2022-12-01 DIAGNOSIS — G3184 Mild cognitive impairment, so stated: Secondary | ICD-10-CM | POA: Diagnosis not present

## 2022-12-01 DIAGNOSIS — E785 Hyperlipidemia, unspecified: Secondary | ICD-10-CM | POA: Diagnosis not present

## 2022-12-06 DIAGNOSIS — E1165 Type 2 diabetes mellitus with hyperglycemia: Secondary | ICD-10-CM | POA: Diagnosis not present

## 2022-12-06 DIAGNOSIS — I10 Essential (primary) hypertension: Secondary | ICD-10-CM | POA: Diagnosis not present

## 2022-12-06 DIAGNOSIS — B353 Tinea pedis: Secondary | ICD-10-CM | POA: Diagnosis not present

## 2022-12-06 DIAGNOSIS — G3184 Mild cognitive impairment, so stated: Secondary | ICD-10-CM | POA: Diagnosis not present

## 2022-12-06 DIAGNOSIS — E785 Hyperlipidemia, unspecified: Secondary | ICD-10-CM | POA: Diagnosis not present

## 2022-12-06 DIAGNOSIS — Z794 Long term (current) use of insulin: Secondary | ICD-10-CM | POA: Diagnosis not present

## 2022-12-22 ENCOUNTER — Other Ambulatory Visit: Payer: Self-pay | Admitting: Family Medicine

## 2022-12-22 DIAGNOSIS — Z794 Long term (current) use of insulin: Secondary | ICD-10-CM

## 2023-01-10 ENCOUNTER — Ambulatory Visit (INDEPENDENT_AMBULATORY_CARE_PROVIDER_SITE_OTHER): Payer: Medicare Other | Admitting: *Deleted

## 2023-01-10 VITALS — BP 125/85 | HR 102 | Ht 71.0 in | Wt 208.4 lb

## 2023-01-10 DIAGNOSIS — Z0000 Encounter for general adult medical examination without abnormal findings: Secondary | ICD-10-CM

## 2023-01-10 NOTE — Progress Notes (Signed)
Subjective:   Phillip Richard is a 72 y.o. male who presents for Medicare Annual/Subsequent preventive examination.  Visit Complete: In person  Patient Medicare AWV questionnaire was completed by the patient on 01/09/23; I have confirmed that all information answered by patient is correct and no changes since this date.  Cardiac Risk Factors include: advanced age (>27men, >22 women);male gender;diabetes mellitus;dyslipidemia;hypertension     Objective:    Today's Vitals   01/10/23 1311  BP: 125/85  Pulse: (!) 102  Weight: 208 lb 6.4 oz (94.5 kg)  Height: 5\' 11"  (1.803 m)   Body mass index is 29.07 kg/m.     01/10/2023    1:16 PM 08/31/2021   11:55 AM 08/04/2016    3:23 PM 01/04/2015    8:12 AM  Advanced Directives  Does Patient Have a Medical Advance Directive? Yes Yes No No  Type of Estate agent of [place];Living will;Out of facility DNR (pink MOST or yellow form) Healthcare Power of [place];Out of facility DNR (pink MOST or yellow form);Living will    Does patient want to make changes to medical advance directive? No - Patient declined No - Patient declined    Copy of Healthcare Power of Attorney in Chart? No - copy requested No - copy requested    Would patient like information on creating a medical advance directive?  No - Patient declined Yes (MAU/Ambulatory/Procedural Areas - Information given) No - patient declined information    Current Medications (verified) Outpatient Encounter Medications as of 01/10/2023  Medication Sig   amLODipine (NORVASC) 5 MG tablet TAKE 1 TABLET(5 MG) BY MOUTH DAILY   atorvastatin (LIPITOR) 40 MG tablet Take 1 tablet (40 mg total) by mouth daily.   fluticasone (FLONASE) 50 MCG/ACT nasal spray Place 2 sprays into both nostrils daily.   glucose blood test strip 1 each by Other route as needed for other. Use as instructed   ketoconazole (NIZORAL) 2 % cream APPLY TOPICALLY TO THE AFFECTED AREA DAILY   lisinopril  (ZESTRIL) 10 MG tablet Take 1 tablet (10 mg total) by mouth daily.   loratadine (CLARITIN) 10 MG tablet Take 1 tablet (10 mg total) by mouth daily. (Patient taking differently: Take 10 mg by mouth daily as needed.)   meloxicam (MOBIC) 7.5 MG tablet Take 1 tablet (7.5 mg total) by mouth daily as needed for pain.   metFORMIN (GLUCOPHAGE) 500 MG tablet TAKE 2 TABLETS(1000 MG) BY MOUTH TWICE DAILY WITH A MEAL   NOVOFINE PLUS 32G X 4 MM MISC USE WITH NOVOLOG FLEXPEN AS DIRECTED   NOVOLOG MIX 70/30 FLEXPEN (70-30) 100 UNIT/ML FlexPen ADMINISTER 16 UNITS UNDER THE SKIN IN THE MORNING AND 8 UNITS IN THE AFTERNOON   tamsulosin (FLOMAX) 0.4 MG CAPS capsule Take 1 capsule (0.4 mg total) by mouth daily.   No facility-administered encounter medications on file as of 01/10/2023.    Allergies (verified) Patient has no known allergies.   History: Past Medical History:  Diagnosis Date   Allergic rhinitis    Allergy    Arthritis    shoulder   Cataract    both eyes - just watching now   Diabetes mellitus without complication (HCC)    History of chicken pox    History of lead poisoning    Hyperlipidemia    Hypertension    Mild cognitive impairment    Mumps    Uses walker    ambulates with walker   Past Surgical History:  Procedure Laterality Date   HERNIA  REPAIR     Umbilical & Inguinal   WISDOM TOOTH EXTRACTION     Family History  Problem Relation Age of Onset   Diabetes Mother    Other Father        Unknown   Rectal cancer Sister    Thyroid disease Sister    Leukemia Brother    Cancer Brother        Other   Diabetes Brother    Diabetes Maternal Aunt    Diabetes Maternal Uncle    Healthy Son        x1   Healthy Daughter        x5   Stomach cancer Neg Hx    Colon polyps Neg Hx    Colon cancer Neg Hx    Social History   Socioeconomic History   Marital status: Widowed    Spouse name: Not on file   Number of children: Not on file   Years of education: Not on file   Highest  education level: 8th grade  Occupational History   Not on file  Tobacco Use   Smoking status: Former    Current packs/day: 0.00    Average packs/day: 0.3 packs/day for 51.5 years (12.9 ttl pk-yrs)    Types: Cigarettes, Cigars    Start date: 73    Quit date: 08/2017    Years since quitting: 5.3   Smokeless tobacco: Never  Vaping Use   Vaping status: Never Used  Substance and Sexual Activity   Alcohol use: Not Currently    Alcohol/week: 2.0 standard drinks of alcohol    Types: 2 Cans of beer per week    Comment: occasionally - none since 08/2017   Drug use: Not Currently    Types: "Crack" cocaine    Comment: For pain  Patient states "last use years ago"   Sexual activity: Not Currently  Other Topics Concern   Not on file  Social History Narrative   Not on file   Social Determinants of Health   Financial Resource Strain: Low Risk  (01/09/2023)   Overall Financial Resource Strain (CARDIA)    Difficulty of Paying Living Expenses: Not very hard  Recent Concern: Financial Resource Strain - Medium Risk (10/20/2022)   Overall Financial Resource Strain (CARDIA)    Difficulty of Paying Living Expenses: Somewhat hard  Food Insecurity: No Food Insecurity (01/09/2023)   Hunger Vital Sign    Worried About Running Out of Food in the Last Year: Never true    Ran Out of Food in the Last Year: Never true  Transportation Needs: No Transportation Needs (01/09/2023)   PRAPARE - Administrator, Civil Service (Medical): No    Lack of Transportation (Non-Medical): No  Physical Activity: Sufficiently Active (01/09/2023)   Exercise Vital Sign    Days of Exercise per Week: 5 days    Minutes of Exercise per Session: 40 min  Recent Concern: Physical Activity - Insufficiently Active (10/20/2022)   Exercise Vital Sign    Days of Exercise per Week: 3 days    Minutes of Exercise per Session: 20 min  Stress: No Stress Concern Present (01/09/2023)   [employer] of Occupational Health  - Occupational Stress Questionnaire    Feeling of Stress : Not at all  Social Connections: Moderately Isolated (01/09/2023)   Social Connection and Isolation Panel [NHANES]    Frequency of Communication with Friends and Family: Three times a week    Frequency of Social Gatherings with Friends and  Family: Twice a week    Attends Religious Services: 1 to 4 times per year    Active Member of Clubs or Organizations: No    Attends Banker Meetings: Never    Marital Status: Widowed    Tobacco Counseling Counseling given: Not Answered   Clinical Intake:  Pre-visit preparation completed: Yes  Pain : No/denies pain  BMI - recorded: 29.07 Nutritional Status: BMI 25 -29 Overweight Nutritional Risks: None Diabetes: Yes CBG done?: No Did pt. bring in CBG monitor from home?: No  How often do you need to have someone help you when you read instructions, pamphlets, or other written materials from your doctor or pharmacy?: 3 - Sometimes  Interpreter Needed?: No  Information entered by :: Donne Anon, CMA   Activities of Daily Living    01/09/2023    9:39 PM  In your present state of health, do you have any difficulty performing the following activities:  Hearing? 0  Vision? 1  Difficulty concentrating or making decisions? 1  Walking or climbing stairs? 1  Dressing or bathing? 0  Doing errands, shopping? 1  Preparing Food and eating ? N  Using the Toilet? N  In the past six months, have you accidently leaked urine? Y  Do you have problems with loss of bowel control? N  Managing your Medications? Y  Managing your Finances? Y  Housekeeping or managing your Housekeeping? Y    Patient Care Team: Sharlene Dory, DO as PCP - General (Family Medicine)  Indicate any recent Medical Services you may have received from other than [facility] providers in the past year (date may be approximate).     Assessment:   This is a routine wellness examination for  Phillip Richard.  Hearing/Vision screen No results found.   Goals Addressed   None    Depression Screen    01/10/2023    1:23 PM 08/09/2022   10:15 AM 08/31/2021   11:57 AM 08/31/2021   11:20 AM 06/17/2020    9:27 AM 08/04/2016    3:24 PM 08/04/2016    2:46 PM  PHQ 2/9 Scores  PHQ - 2 Score 0 0 0 0 0 0 0  PHQ- 9 Score  0  0   0    Fall Risk    01/09/2023    9:39 PM 08/09/2022   10:15 AM 08/31/2021   11:56 AM 08/31/2021   11:21 AM 05/06/2020    9:50 AM  Fall Risk   Falls in the past year? 1 0 0 0 0  Number falls in past yr: 0 0 0 0 0  Injury with Fall? 0 0 0 0 0  Risk for fall due to : History of fall(s) No Fall Risks No Fall Risks No Fall Risks   Follow up Falls evaluation completed Falls evaluation completed Falls evaluation completed Falls evaluation completed     MEDICARE RISK AT HOME: Medicare Risk at Home Any stairs in or around the home?: No If so, are there any without handrails?: No Home free of loose throw rugs in walkways, pet beds, electrical cords, etc?: Yes Adequate lighting in your home to reduce risk of falls?: Yes Life alert?: No Use of a cane, walker or w/c?: Yes Grab bars in the bathroom?: Yes Shower chair or bench in shower?: Yes Elevated toilet seat or a handicapped toilet?: Yes  TIMED UP AND GO:  Was the test performed?  Yes  Length of time to ambulate 10 feet: 17 sec Gait slow  and steady with assistive device    Cognitive Function:    08/04/2016    3:25 PM  MMSE - Mini Mental State Exam  Orientation to time 5  Orientation to Place 4  Registration 3  Attention/ Calculation 5  Recall 3  Language- name 2 objects 2  Language- repeat 0  Language- follow 3 step command 3  Language- read & follow direction 1  Write a sentence 1  Copy design 1  Total score 28        01/10/2023    1:23 PM 08/31/2021   12:05 PM  6CIT Screen  What Year? 0 points 0 points  What month? 0 points 0 points  What time? 0 points 0 points  Count back from 20 0 points 0 points   Months in reverse 4 points 4 points  Repeat phrase 2 points 8 points  Total Score 6 points 12 points    Immunizations Immunization History  Administered Date(s) Administered   Fluad Quad(high Dose 65+) 11/15/2018, 12/17/2019, 12/12/2020   Influenza, High Dose Seasonal PF 11/18/2016, 12/16/2017   Influenza-Unspecified 03/02/2015   Moderna SARS-COV2 Booster Vaccination 07/11/2020   Moderna Sars-Covid-2 Vaccination 03/21/2019, 04/12/2019, 01/11/2020   PNEUMOCOCCAL CONJUGATE-20 08/31/2021   PPD Test 11/29/2017   Pfizer Covid-19 Vaccine Bivalent Booster 69yrs & up 07/02/2021   Pneumococcal Conjugate-13 11/18/2016   Pneumococcal Polysaccharide-23 11/11/2009, 12/16/2017   Tdap 06/13/2017   Zoster Recombinant(Shingrix) 12/12/2020    TDAP status: Up to date  Flu Vaccine status: Up to date per pt  Pneumococcal vaccine status: Up to date  Covid-19 vaccine status: Information provided on how to obtain vaccines.   Qualifies for Shingles Vaccine? Yes   Zostavax completed No   Shingrix Completed?: No.    Education has been provided regarding the importance of this vaccine. Patient has been advised to call insurance company to determine out of pocket expense if they have not yet received this vaccine. Advised may also receive vaccine at local pharmacy or Health Dept. Verbalized acceptance and understanding.  Screening Tests Health Maintenance  Topic Date Due   Medicare Annual Wellness (AWV)  09/01/2022   INFLUENZA VACCINE  09/30/2022   COVID-19 Vaccine (5 - 2023-24 season) 10/31/2022   OPHTHALMOLOGY EXAM  11/13/2022   Colonoscopy  03/31/2023   HEMOGLOBIN A1C  02/08/2023   Diabetic kidney evaluation - eGFR measurement  08/09/2023   Diabetic kidney evaluation - Urine ACR  08/09/2023   FOOT EXAM  08/09/2023   DTaP/Tdap/Td (2 - Td or Tdap) 06/14/2027   Pneumonia Vaccine 28+ Years old  Completed   Hepatitis C Screening  Completed   HPV VACCINES  Aged Out   Zoster Vaccines- Shingrix   Discontinued    Health Maintenance  Health Maintenance Due  Topic Date Due   Medicare Annual Wellness (AWV)  09/01/2022   INFLUENZA VACCINE  09/30/2022   COVID-19 Vaccine (5 - 2023-24 season) 10/31/2022   OPHTHALMOLOGY EXAM  11/13/2022   Colonoscopy  03/31/2023    Colorectal cancer screening: Type of screening: Colonoscopy. Completed 03/30/18. Repeat every 5 years  Lung Cancer Screening: (Low Dose CT Chest recommended if Age 75-80 years, 20 pack-year currently smoking OR have quit w/in 15years.) does not qualify.   Additional Screening:  Hepatitis C Screening: does qualify; Completed 06/13/17  Vision Screening: Recommended annual ophthalmology exams for early detection of glaucoma and other disorders of the eye. Is the patient up to date with their annual eye exam?  No  Who is the provider or what  is the name of the office in which the patient attends annual eye exams? Can't remember name at this time If pt is not established with a provider, would they like to be referred to a provider to establish care? No .   Dental Screening: Recommended annual dental exams for proper oral hygiene  Diabetic Foot Exam: Diabetic Foot Exam: Completed 08/09/22  Community Resource Referral / Chronic Care Management: CRR required this visit?  No   CCM required this visit?  No     Plan:     I have personally reviewed and noted the following in the patient's chart:   Medical and social history Use of alcohol, tobacco or illicit drugs  Current medications and supplements including opioid prescriptions. Patient is not currently taking opioid prescriptions. Functional ability and status Nutritional status Physical activity Advanced directives List of other physicians Hospitalizations, surgeries, and ER visits in previous 12 months Vitals Screenings to include cognitive, depression, and falls Referrals and appointments  In addition, I have reviewed and discussed with patient certain  preventive protocols, quality metrics, and best practice recommendations. A written personalized care plan for preventive services as well as general preventive health recommendations were provided to patient.     Donne Anon, CMA   01/10/2023   After Visit Summary: (In Person-Declined) Patient declined AVS at this time.  Nurse Notes: None

## 2023-01-10 NOTE — Patient Instructions (Signed)
[name] , Thank you for taking time to come for your Medicare Wellness Visit. I appreciate your ongoing commitment to your health goals. Please review the following plan we discussed and let me know if I can assist you in the future.     This is a list of the screening recommended for you and due dates:  Health Maintenance  Topic Date Due   COVID-19 Vaccine (5 - 2023-24 season) 10/31/2022   Eye exam for diabetics  11/13/2022   Colon Cancer Screening  03/31/2023   Flu Shot  05/30/2023*   Hemoglobin A1C  02/08/2023   Yearly kidney function blood test for diabetes  08/09/2023   Yearly kidney health urinalysis for diabetes  08/09/2023   Complete foot exam   08/09/2023   Medicare Annual Wellness Visit  01/10/2024   DTaP/Tdap/Td vaccine (2 - Td or Tdap) 06/14/2027   Pneumonia Vaccine  Completed   Hepatitis C Screening  Completed   HPV Vaccine  Aged Out   Zoster (Shingles) Vaccine  Discontinued  *Topic was postponed. The date shown is not the original due date.    Next appointment: Follow up in one year for your annual wellness visit.   Preventive Care 15 Years and Older, Male Preventive care refers to lifestyle choices and visits with your health care provider that can promote health and wellness. What does preventive care include? A yearly physical exam. This is also called an annual well check. Dental exams once or twice a year. Routine eye exams. Ask your health care provider how often you should have your eyes checked. Personal lifestyle choices, including: Daily care of your teeth and gums. Regular physical activity. Eating a healthy diet. Avoiding tobacco and drug use. Limiting alcohol use. Practicing safe sex. Taking low doses of aspirin every day. Taking vitamin and mineral supplements as recommended by your health care provider. What happens during an annual well check? The services and screenings done by your health care provider during your annual well check will  depend on your age, overall health, lifestyle risk factors, and family history of disease. Counseling  Your health care provider may ask you questions about your: Alcohol use. Tobacco use. Drug use. Emotional well-being. Home and relationship well-being. Sexual activity. Eating habits. History of falls. Memory and ability to understand (cognition). Work and work Astronomer. Screening  You may have the following tests or measurements: Height, weight, and BMI. Blood pressure. Lipid and cholesterol levels. These may be checked every 5 years, or more frequently if you are over 4 years old. Skin check. Lung cancer screening. You may have this screening every year starting at age 70 if you have a 30-pack-year history of smoking and currently smoke or have quit within the past 15 years. Fecal occult blood test (FOBT) of the stool. You may have this test every year starting at age 87. Flexible sigmoidoscopy or colonoscopy. You may have a sigmoidoscopy every 5 years or a colonoscopy every 10 years starting at age 23. Prostate cancer screening. Recommendations will vary depending on your family history and other risks. Hepatitis C blood test. Hepatitis B blood test. Sexually transmitted disease (STD) testing. Diabetes screening. This is done by checking your blood sugar (glucose) after you have not eaten for a while (fasting). You may have this done every 1-3 years. Abdominal aortic aneurysm (AAA) screening. You may need this if you are a current or former smoker. Osteoporosis. You may be screened starting at age 36 if you are at high risk.  Talk with your health care provider about your test results, treatment options, and if necessary, the need for more tests. Vaccines  Your health care provider may recommend certain vaccines, such as: Influenza vaccine. This is recommended every year. Tetanus, diphtheria, and acellular pertussis (Tdap, Td) vaccine. You may need a Td booster every 10  years. Zoster vaccine. You may need this after age 46. Pneumococcal 13-valent conjugate (PCV13) vaccine. One dose is recommended after age 81. Pneumococcal polysaccharide (PPSV23) vaccine. One dose is recommended after age 63. Talk to your health care provider about which screenings and vaccines you need and how often you need them. This information is not intended to replace advice given to you by your health care provider. Make sure you discuss any questions you have with your health care provider. Document Released: 03/14/2015 Document Revised: 11/05/2015 Document Reviewed: 12/17/2014 Elsevier Interactive Patient Education  2017 [employer].  Fall Prevention in the Home Falls can cause injuries. They can happen to people of all ages. There are many things you can do to make your home safe and to help prevent falls. What can I do on the outside of my home? Regularly fix the edges of walkways and driveways and fix any cracks. Remove anything that might make you trip as you walk through a door, such as a raised step or threshold. Trim any bushes or trees on the path to your home. Use bright outdoor lighting. Clear any walking paths of anything that might make someone trip, such as rocks or tools. Regularly check to see if handrails are loose or broken. Make sure that both sides of any steps have handrails. Any raised decks and porches should have guardrails on the edges. Have any leaves, snow, or ice cleared regularly. Use sand or salt on walking paths during winter. Clean up any spills in your garage right away. This includes oil or grease spills. What can I do in the bathroom? Use night lights. Install grab bars by the toilet and in the tub and shower. Do not use towel bars as grab bars. Use non-skid mats or decals in the tub or shower. If you need to sit down in the shower, use a plastic, non-slip stool. Keep the floor dry. Clean up any water that spills on the floor as soon as it  happens. Remove soap buildup in the tub or shower regularly. Attach bath mats securely with double-sided non-slip rug tape. Do not have throw rugs and other things on the floor that can make you trip. What can I do in the bedroom? Use night lights. Make sure that you have a light by your bed that is easy to reach. Do not use any sheets or blankets that are too big for your bed. They should not hang down onto the floor. Have a firm chair that has side arms. You can use this for support while you get dressed. Do not have throw rugs and other things on the floor that can make you trip. What can I do in the kitchen? Clean up any spills right away. Avoid walking on wet floors. Keep items that you use a lot in easy-to-reach places. If you need to reach something above you, use a strong step stool that has a grab bar. Keep electrical cords out of the way. Do not use floor polish or wax that makes floors slippery. If you must use wax, use non-skid floor wax. Do not have throw rugs and other things on the floor that can make  you trip. What can I do with my stairs? Do not leave any items on the stairs. Make sure that there are handrails on both sides of the stairs and use them. Fix handrails that are broken or loose. Make sure that handrails are as long as the stairways. Check any carpeting to make sure that it is firmly attached to the stairs. Fix any carpet that is loose or worn. Avoid having throw rugs at the top or bottom of the stairs. If you do have throw rugs, attach them to the floor with carpet tape. Make sure that you have a light switch at the top of the stairs and the bottom of the stairs. If you do not have them, ask someone to add them for you. What else can I do to help prevent falls? Wear shoes that: Do not have high heels. Have rubber bottoms. Are comfortable and fit you well. Are closed at the toe. Do not wear sandals. If you use a stepladder: Make sure that it is fully opened.  Do not climb a closed stepladder. Make sure that both sides of the stepladder are locked into place. Ask someone to hold it for you, if possible. Clearly mark and make sure that you can see: Any grab bars or handrails. First and last steps. Where the edge of each step is. Use tools that help you move around (mobility aids) if they are needed. These include: Canes. Walkers. Scooters. Crutches. Turn on the lights when you go into a dark area. Replace any light bulbs as soon as they burn out. Set up your furniture so you have a clear path. Avoid moving your furniture around. If any of your floors are uneven, fix them. If there are any pets around you, be aware of where they are. Review your medicines with your doctor. Some medicines can make you feel dizzy. This can increase your chance of falling. Ask your doctor what other things that you can do to help prevent falls. This information is not intended to replace advice given to you by your health care provider. Make sure you discuss any questions you have with your health care provider. Document Released: 12/12/2008 Document Revised: 07/24/2015 Document Reviewed: 03/22/2014 Elsevier Interactive Patient Education  2017 [employer].

## 2023-01-13 ENCOUNTER — Other Ambulatory Visit: Payer: Self-pay | Admitting: Family Medicine

## 2023-01-13 DIAGNOSIS — N401 Enlarged prostate with lower urinary tract symptoms: Secondary | ICD-10-CM

## 2023-01-21 ENCOUNTER — Other Ambulatory Visit: Payer: Self-pay | Admitting: Family Medicine

## 2023-01-21 DIAGNOSIS — M722 Plantar fascial fibromatosis: Secondary | ICD-10-CM

## 2023-01-25 DIAGNOSIS — M79675 Pain in left toe(s): Secondary | ICD-10-CM | POA: Diagnosis not present

## 2023-01-25 DIAGNOSIS — B351 Tinea unguium: Secondary | ICD-10-CM | POA: Diagnosis not present

## 2023-01-25 DIAGNOSIS — L84 Corns and callosities: Secondary | ICD-10-CM | POA: Diagnosis not present

## 2023-01-25 DIAGNOSIS — E119 Type 2 diabetes mellitus without complications: Secondary | ICD-10-CM | POA: Diagnosis not present

## 2023-01-25 DIAGNOSIS — M2041 Other hammer toe(s) (acquired), right foot: Secondary | ICD-10-CM | POA: Diagnosis not present

## 2023-01-28 DIAGNOSIS — Z23 Encounter for immunization: Secondary | ICD-10-CM | POA: Diagnosis not present

## 2023-02-09 ENCOUNTER — Other Ambulatory Visit: Payer: Self-pay | Admitting: Family Medicine

## 2023-02-09 DIAGNOSIS — B353 Tinea pedis: Secondary | ICD-10-CM

## 2023-02-14 ENCOUNTER — Ambulatory Visit (INDEPENDENT_AMBULATORY_CARE_PROVIDER_SITE_OTHER): Payer: Medicare Other | Admitting: Family Medicine

## 2023-02-14 VITALS — BP 130/74 | HR 92 | Temp 98.0°F | Resp 16 | Ht 71.0 in | Wt 208.0 lb

## 2023-02-14 DIAGNOSIS — E785 Hyperlipidemia, unspecified: Secondary | ICD-10-CM

## 2023-02-14 DIAGNOSIS — R351 Nocturia: Secondary | ICD-10-CM | POA: Diagnosis not present

## 2023-02-14 DIAGNOSIS — Z794 Long term (current) use of insulin: Secondary | ICD-10-CM | POA: Diagnosis not present

## 2023-02-14 DIAGNOSIS — N401 Enlarged prostate with lower urinary tract symptoms: Secondary | ICD-10-CM

## 2023-02-14 DIAGNOSIS — I10 Essential (primary) hypertension: Secondary | ICD-10-CM | POA: Diagnosis not present

## 2023-02-14 DIAGNOSIS — E1165 Type 2 diabetes mellitus with hyperglycemia: Secondary | ICD-10-CM

## 2023-02-14 LAB — LIPID PANEL
Cholesterol: 75 mg/dL (ref 0–200)
HDL: 33.9 mg/dL — ABNORMAL LOW (ref >39.00)
LDL Cholesterol: 24 mg/dL (ref 0–99)
NonHDL: 41.05
Total CHOL/HDL Ratio: 2
Triglycerides: 83 mg/dL (ref 0.0–149.0)
VLDL: 16.6 mg/dL (ref 0.0–40.0)

## 2023-02-14 LAB — COMPREHENSIVE METABOLIC PANEL
ALT: 16 U/L (ref 0–53)
AST: 16 U/L (ref 0–37)
Albumin: 4 g/dL (ref 3.5–5.2)
Alkaline Phosphatase: 85 U/L (ref 39–117)
BUN: 16 mg/dL (ref 6–23)
CO2: 24 meq/L (ref 19–32)
Calcium: 9.3 mg/dL (ref 8.4–10.5)
Chloride: 107 meq/L (ref 96–112)
Creatinine, Ser: 0.97 mg/dL (ref 0.40–1.50)
GFR: 76.87 mL/min (ref >60.00)
Glucose, Bld: 87 mg/dL (ref 70–99)
Potassium: 4.5 meq/L (ref 3.5–5.1)
Sodium: 141 meq/L (ref 135–145)
Total Bilirubin: 0.5 mg/dL (ref 0.2–1.2)
Total Protein: 7 g/dL (ref 6.0–8.3)

## 2023-02-14 LAB — HEMOGLOBIN A1C: Hgb A1c MFr Bld: 7.6 % — ABNORMAL HIGH (ref 4.6–6.5)

## 2023-02-14 NOTE — Progress Notes (Signed)
Subjective:   Chief Complaint  Patient presents with   Follow-up    Follow up    Phillip Richard is a 72 y.o. male here for follow-up of diabetes.   [name]'s self monitored glucose range is mid 100's.  Patient denies hypoglycemic reactions. He checks his glucose levels 1 time(s) per day. Patient does require insulin.  Novolog 70/30 16 u in AM, 8 u in PM Medications include: metformin 1000 mg bid Diet is OK.  Exercise: none  Hypertension Patient presents for hypertension follow up. He does not monitor home blood pressures. He is compliant with medications- Norvasc 5 mg/d, lisinopril 10 mg/d. Patient has these side effects of medication: none Diet/exercise as above. No CP or SOB.   Dyslipidemia Patient presents for dyslipidemia follow up. Currently being treated with Lipitor 40 mg/d and compliance with treatment thus far has been good. He denies myalgias. Diet/exercise as above.  The patient is not known to have coexisting coronary artery disease.  BPH Taking Flomax 0.4 mg/d. Compliant, no AE's. Goes 3 times per night. No pain, bleeding, dc, constipation. Does not wish to alter his dosage.   Past Medical History:  Diagnosis Date   Allergic rhinitis    Allergy    Arthritis    shoulder   Cataract    both eyes - just watching now   Diabetes mellitus without complication (HCC)    History of chicken pox    History of lead poisoning    Hyperlipidemia    Hypertension    Mild cognitive impairment    Mumps    Uses walker    ambulates with walker     Related testing: Retinal exam: Due Pneumovax: done  Objective:  BP 130/74   Pulse 92   Temp 98 F (36.7 C) (Oral)   Resp 16   Ht 5\' 11"  (1.803 m)   Wt 208 lb (94.3 kg)   SpO2 95%   BMI 29.01 kg/m  General:  Well developed, well nourished, in no apparent distress Heart: RRR, 2+ pitting edema tapering at mid tibia b/l MSK: no calf pain b/l Lungs:  CTAB, no access msc use Psych: Age appropriate judgment and  insight  Assessment:   Type 2 diabetes mellitus with hyperglycemia, with long-term current use of insulin (HCC) - Plan: Comprehensive metabolic panel, Lipid panel, Hemoglobin A1c  Essential hypertension  Dyslipidemia  Benign prostatic hyperplasia with nocturia   Plan:   Chronic, stable. Cont Novolog 70/30 16 u in AM, 8 u in PM, metformin 1 g bid. Counseled on diet and exercise. His sister will help him sched his eye exam.  Chronic, stable. Cont lisinopril 10 mg/d, Norvasc 5 mg/d.  Chronic, stable. Cont Lipitor 40 mg/d.  Chronic, stable. Cont Flomax 0.4 mg/d.  F/u in 6 mo. The patient voiced understanding and agreement to the plan.  Phillip Roche Roosevelt, DO 02/14/23 11:36 AM

## 2023-02-14 NOTE — Patient Instructions (Addendum)
Give [place] 2-3 business days to get the results of your labs back.   Keep the diet clean and stay active.  Please schedule your eye exam.   For the swelling in your lower extremities, be sure to elevate your legs when able, mind the salt intake, stay physically active and consider wearing compression stockings.  Let us know if you need anything.

## 2023-02-21 ENCOUNTER — Ambulatory Visit: Payer: Medicare Other | Admitting: Family Medicine

## 2023-03-20 ENCOUNTER — Other Ambulatory Visit: Payer: Self-pay | Admitting: Family Medicine

## 2023-03-20 DIAGNOSIS — M722 Plantar fascial fibromatosis: Secondary | ICD-10-CM

## 2023-03-30 ENCOUNTER — Other Ambulatory Visit: Payer: Self-pay

## 2023-03-30 DIAGNOSIS — E119 Type 2 diabetes mellitus without complications: Secondary | ICD-10-CM

## 2023-03-30 MED ORDER — NOVOLOG MIX 70/30 FLEXPEN (70-30) 100 UNIT/ML ~~LOC~~ SUPN: PEN_INJECTOR | SUBCUTANEOUS | 3 refills | Status: DC

## 2023-04-17 ENCOUNTER — Other Ambulatory Visit: Payer: Self-pay | Admitting: Family Medicine

## 2023-04-17 DIAGNOSIS — N401 Enlarged prostate with lower urinary tract symptoms: Secondary | ICD-10-CM

## 2023-04-18 DIAGNOSIS — H35033 Hypertensive retinopathy, bilateral: Secondary | ICD-10-CM | POA: Diagnosis not present

## 2023-04-18 DIAGNOSIS — E119 Type 2 diabetes mellitus without complications: Secondary | ICD-10-CM | POA: Diagnosis not present

## 2023-04-18 DIAGNOSIS — H2513 Age-related nuclear cataract, bilateral: Secondary | ICD-10-CM | POA: Diagnosis not present

## 2023-04-18 DIAGNOSIS — H52203 Unspecified astigmatism, bilateral: Secondary | ICD-10-CM | POA: Diagnosis not present

## 2023-04-18 DIAGNOSIS — H5201 Hypermetropia, right eye: Secondary | ICD-10-CM | POA: Diagnosis not present

## 2023-04-18 DIAGNOSIS — H04123 Dry eye syndrome of bilateral lacrimal glands: Secondary | ICD-10-CM | POA: Diagnosis not present

## 2023-04-18 DIAGNOSIS — H524 Presbyopia: Secondary | ICD-10-CM | POA: Diagnosis not present

## 2023-04-18 DIAGNOSIS — Z794 Long term (current) use of insulin: Secondary | ICD-10-CM | POA: Diagnosis not present

## 2023-04-18 LAB — HM DIABETES EYE EXAM

## 2023-04-20 ENCOUNTER — Other Ambulatory Visit: Payer: Self-pay | Admitting: Family Medicine

## 2023-04-20 DIAGNOSIS — U071 COVID-19: Secondary | ICD-10-CM

## 2023-05-12 ENCOUNTER — Other Ambulatory Visit: Payer: Self-pay | Admitting: Family Medicine

## 2023-05-12 DIAGNOSIS — E119 Type 2 diabetes mellitus without complications: Secondary | ICD-10-CM

## 2023-05-12 MED ORDER — NOVOLOG MIX 70/30 FLEXPEN (70-30) 100 UNIT/ML ~~LOC~~ SUPN: PEN_INJECTOR | SUBCUTANEOUS | 3 refills | Status: AC

## 2023-05-12 NOTE — Telephone Encounter (Signed)
 Copied from CRM [id number]. Topic: Clinical - Medication Refill >> May 12, 2023 10:43 AM Drema Balzarine wrote: Most Recent Primary Care Visit:  Provider: Sharlene Dory  Department: [facility]  Visit Type: OFFICE VISIT  Date: 02/14/2023  Medication: insulin aspart protamine - aspart (NOVOLOG MIX 70/30 FLEXPEN) (70-30) 100 UNIT/ML FlexPen [[id number]]  Has the patient contacted their pharmacy? Yes (Agent: If no, request that the patient contact the pharmacy for the refill. If patient does not wish to contact the pharmacy document the reason why and proceed with request.) (Agent: If yes, when and what did the pharmacy advise?)  Is this the correct pharmacy for this prescription? Yes If no, delete pharmacy and type the correct one.  This is the patient's preferred pharmacy:  [id number] - [place], [place] - [address] [place] [address] Phone: [phone] Fax: [phone]   Has the prescription been filled recently? Yes  Is the patient out of the medication? No, 2 more shots left   Has the patient been seen for an appointment in the last year OR does the patient have an upcoming appointment? Yes  Can we respond through MyChart? No  Agent: Please be advised that Rx refills may take up to 3 business days. We ask that you follow-up with your pharmacy.

## 2023-05-22 ENCOUNTER — Other Ambulatory Visit: Payer: Self-pay | Admitting: Family Medicine

## 2023-05-22 DIAGNOSIS — M722 Plantar fascial fibromatosis: Secondary | ICD-10-CM

## 2023-07-19 ENCOUNTER — Other Ambulatory Visit: Payer: Self-pay | Admitting: Family Medicine

## 2023-07-19 DIAGNOSIS — M722 Plantar fascial fibromatosis: Secondary | ICD-10-CM

## 2023-07-20 ENCOUNTER — Other Ambulatory Visit: Payer: Self-pay | Admitting: Family Medicine

## 2023-07-20 DIAGNOSIS — N401 Enlarged prostate with lower urinary tract symptoms: Secondary | ICD-10-CM

## 2023-07-20 DIAGNOSIS — R351 Nocturia: Secondary | ICD-10-CM

## 2023-07-25 ENCOUNTER — Other Ambulatory Visit: Payer: Self-pay | Admitting: Family Medicine

## 2023-07-25 DIAGNOSIS — N401 Enlarged prostate with lower urinary tract symptoms: Secondary | ICD-10-CM

## 2023-07-29 DIAGNOSIS — B351 Tinea unguium: Secondary | ICD-10-CM | POA: Diagnosis not present

## 2023-07-29 DIAGNOSIS — E119 Type 2 diabetes mellitus without complications: Secondary | ICD-10-CM | POA: Diagnosis not present

## 2023-07-29 DIAGNOSIS — L84 Corns and callosities: Secondary | ICD-10-CM | POA: Diagnosis not present

## 2023-07-29 DIAGNOSIS — M79675 Pain in left toe(s): Secondary | ICD-10-CM | POA: Diagnosis not present

## 2023-08-02 ENCOUNTER — Other Ambulatory Visit: Payer: Self-pay | Admitting: Family Medicine

## 2023-08-10 ENCOUNTER — Other Ambulatory Visit: Payer: Self-pay | Admitting: Family Medicine

## 2023-08-10 DIAGNOSIS — I10 Essential (primary) hypertension: Secondary | ICD-10-CM

## 2023-09-06 ENCOUNTER — Other Ambulatory Visit: Payer: Self-pay | Admitting: Family Medicine

## 2023-09-09 ENCOUNTER — Ambulatory Visit (INDEPENDENT_AMBULATORY_CARE_PROVIDER_SITE_OTHER): Admitting: Family Medicine

## 2023-09-09 ENCOUNTER — Encounter: Payer: Self-pay | Admitting: Family Medicine

## 2023-09-09 VITALS — BP 126/78 | HR 100 | Temp 98.0°F | Resp 16 | Ht 71.0 in | Wt 208.0 lb

## 2023-09-09 DIAGNOSIS — I10 Essential (primary) hypertension: Secondary | ICD-10-CM

## 2023-09-09 DIAGNOSIS — Z7984 Long term (current) use of oral hypoglycemic drugs: Secondary | ICD-10-CM

## 2023-09-09 DIAGNOSIS — E785 Hyperlipidemia, unspecified: Secondary | ICD-10-CM | POA: Diagnosis not present

## 2023-09-09 DIAGNOSIS — E1165 Type 2 diabetes mellitus with hyperglycemia: Secondary | ICD-10-CM | POA: Diagnosis not present

## 2023-09-09 DIAGNOSIS — Z794 Long term (current) use of insulin: Secondary | ICD-10-CM | POA: Diagnosis not present

## 2023-09-09 DIAGNOSIS — Z1211 Encounter for screening for malignant neoplasm of colon: Secondary | ICD-10-CM

## 2023-09-09 NOTE — Progress Notes (Signed)
 Subjective:   Chief Complaint  Patient presents with   Medication Refill    Medication Refill    Phillip Richard is a 73 y.o. male here for follow-up of diabetes.   Phillip Richard does not routinely ck his sugars.  Patient does require insulin .  70-30 16 u in AM, 8 u in PM Medications include: Metformin  1000 mg twice daily Diet is overall healthy.  Exercise: limited  Hypertension Patient presents for hypertension follow up. He does monitor home blood pressures.SABRA He is compliant with medications- lisinopril  10 mg/d, Norvasc  5 mg/d Patient has these side effects of medication: none Diet/exercise as above. No CP or SOB.   Dyslipidemia Patient presents for dyslipidemia follow up. Currently being treated with Lipitor 40 mg/d and compliance with treatment thus far has been good. He denies myalgias. Diet/exercise as above.  The patient is not known to have coexisting coronary artery disease.  Past Medical History:  Diagnosis Date   Allergic rhinitis    Allergy    Arthritis    shoulder   Cataract    both eyes - just watching now   Diabetes mellitus without complication (HCC)    History of chicken pox    History of lead poisoning    Hyperlipidemia    Hypertension    Mild cognitive impairment    Mumps    Uses walker    ambulates with walker     Related testing: Retinal exam: Done Pneumovax: done  Objective:  BP 126/78 (BP Location: Left Arm, Patient Position: Sitting)   Pulse 100   Temp 98 F (36.7 C) (Oral)   Resp 16   Ht 5' 11 (1.803 m)   Wt 208 lb (94.3 kg)   SpO2 98%   BMI 29.01 kg/m  General:  Well developed, well nourished, in no apparent distress Skin:  Warm, no pallor or diaphoresis Head:  Normocephalic, atraumatic Eyes:  Pupils equal and round, sclera anicteric without injection  Lungs:  CTAB, no access msc use Cardio:  RRR, no bruits, no LE edema Musculoskeletal:  Symmetrical muscle groups noted without atrophy or deformity Neuro:  Sensation intact to  pinprick on feet Psych: Age appropriate judgment and insight  Assessment:   Type 2 diabetes mellitus with hyperglycemia, with long-term current use of insulin  (HCC) - Plan: Comprehensive metabolic panel with GFR, Lipid panel, Hemoglobin A1c, Microalbumin / creatinine urine ratio  Essential hypertension  Dyslipidemia  Screen for colon cancer - Plan: Ambulatory referral to Gastroenterology   Plan:   Chronic, hopefully stable.  Continue metformin  1000 mg twice daily, NovoLog  70/30 16 units in the morning, 8 units in the afternoon.  Counseled on diet and exercise. Chronic, stable.  Continue Norvasc  5 mg daily, lisinopril  10 mg daily. Chronic, stable.  Continue Lipitor 40 mg daily. Due for colonoscopy.  Referral placed to GI again. F/u in 6 mo. The patient voiced understanding and agreement to the plan.  Mabel Mt [place], DO 09/09/23 4:04 PM

## 2023-09-09 NOTE — Patient Instructions (Signed)
Give us 2-3 business days to get the results of your labs back.   Keep the diet clean and stay active.  If you do not hear anything about your referral in the next 1-2 weeks, call our office and ask for an update.  Let us know if you need anything. 

## 2023-09-10 ENCOUNTER — Ambulatory Visit: Payer: Self-pay | Admitting: Family Medicine

## 2023-09-10 ENCOUNTER — Other Ambulatory Visit: Payer: Self-pay | Admitting: Family Medicine

## 2023-09-10 LAB — COMPREHENSIVE METABOLIC PANEL WITH GFR
AG Ratio: 1.5 (calc) (ref 1.0–2.5)
ALT: 21 U/L (ref 9–46)
AST: 22 U/L (ref 10–35)
Albumin: 4 g/dL (ref 3.6–5.1)
Alkaline phosphatase (APISO): 78 U/L (ref 35–144)
BUN: 23 mg/dL (ref 7–25)
CO2: 23 mmol/L (ref 20–32)
Calcium: 9.5 mg/dL (ref 8.6–10.3)
Chloride: 105 mmol/L (ref 98–110)
Creat: 1.12 mg/dL (ref 0.70–1.28)
Globulin: 2.7 g/dL (ref 1.9–3.7)
Glucose, Bld: 170 mg/dL — ABNORMAL HIGH (ref 65–99)
Potassium: 4.6 mmol/L (ref 3.5–5.3)
Sodium: 140 mmol/L (ref 135–146)
Total Bilirubin: 0.4 mg/dL (ref 0.2–1.2)
Total Protein: 6.7 g/dL (ref 6.1–8.1)
eGFR: 69 mL/min/1.73m2 (ref >=60)

## 2023-09-10 LAB — LIPID PANEL
Cholesterol: 68 mg/dL (ref <200)
HDL: 26 mg/dL — ABNORMAL LOW (ref >=40)
LDL Cholesterol (Calc): 16 mg/dL
Non-HDL Cholesterol (Calc): 42 mg/dL (ref <130)
Total CHOL/HDL Ratio: 2.6 (calc) (ref <5.0)
Triglycerides: 189 mg/dL — ABNORMAL HIGH (ref <150)

## 2023-09-10 LAB — HEMOGLOBIN A1C
Hgb A1c MFr Bld: 8.4 % — ABNORMAL HIGH (ref <5.7)
Mean Plasma Glucose: 194 mg/dL
eAG (mmol/L): 10.8 mmol/L

## 2023-09-10 LAB — MICROALBUMIN / CREATININE URINE RATIO
Creatinine, Urine: 167 mg/dL (ref 20–320)
Microalb Creat Ratio: 116 mg/g{creat} — ABNORMAL HIGH (ref <30)
Microalb, Ur: 19.4 mg/dL

## 2023-09-10 MED ORDER — EMPAGLIFLOZIN 10 MG PO TABS: 10.0000 mg | ORAL_TABLET | Freq: Every day | ORAL | 2 refills | Status: DC

## 2023-09-13 ENCOUNTER — Telehealth: Payer: Self-pay

## 2023-09-13 NOTE — Telephone Encounter (Signed)
 done

## 2023-09-16 ENCOUNTER — Other Ambulatory Visit: Payer: Self-pay

## 2023-09-16 ENCOUNTER — Telehealth: Payer: Self-pay

## 2023-09-16 MED ORDER — LISINOPRIL 10 MG PO TABS: 10.0000 mg | ORAL_TABLET | Freq: Every day | ORAL | 2 refills | Status: DC

## 2023-09-16 NOTE — Telephone Encounter (Signed)
 Copied from CRM [id number]. Topic: Clinical - Medication Question >> Sep 16, 2023  9:54 AM Robinson H wrote: Reason for CRM: Patients Niece [facility] calling to verify if patient had any medication changes at his visit on 7/11  [facility] [phone]  Niece call and was advised of results and  3 month DM appt schedule.

## 2023-09-30 ENCOUNTER — Other Ambulatory Visit: Payer: Self-pay | Admitting: Family Medicine

## 2023-09-30 DIAGNOSIS — M722 Plantar fascial fibromatosis: Secondary | ICD-10-CM

## 2023-10-07 ENCOUNTER — Telehealth: Payer: Self-pay

## 2023-10-07 NOTE — Telephone Encounter (Signed)
 Copied from CRM [id number]. Topic: General - Other >> Oct 07, 2023 10:51 AM Rosina BIRCH wrote: Reason for CRM: Phillip Richard from [facility] called stating the patient had a fall this morning at 7:30am while he was coming out the bathroom. The patient lost his balance and hit his back against the door and slid down. Patient has no injuries CB [phone]

## 2023-10-18 ENCOUNTER — Telehealth: Payer: Self-pay

## 2023-10-18 NOTE — Telephone Encounter (Signed)
 Copied from CRM [id number]. Topic: Clinical - Home Health Verbal Orders >> Oct 18, 2023  3:14 PM Viola F wrote: Caller/Agency: Verneita from [facility]  Callback [phone].  Service Requested: Physical Therapy Frequency: N/A Any new concerns about the patient? Yes, needs verbal order to do PT eval and treatment

## 2023-10-18 NOTE — Telephone Encounter (Signed)
 OK

## 2023-10-19 ENCOUNTER — Other Ambulatory Visit: Payer: Self-pay | Admitting: Family Medicine

## 2023-10-19 ENCOUNTER — Telehealth: Payer: Self-pay

## 2023-10-19 DIAGNOSIS — E1165 Type 2 diabetes mellitus with hyperglycemia: Secondary | ICD-10-CM

## 2023-10-19 MED ORDER — ATORVASTATIN CALCIUM 40 MG PO TABS: 40.0000 mg | ORAL_TABLET | Freq: Every day | ORAL | 0 refills | Status: DC

## 2023-10-19 NOTE — Telephone Encounter (Signed)
 Copied from CRM [id number]. Topic: Clinical - Medication Refill >> Oct 19, 2023  4:30 PM Arm[place] J wrote: Medication: atorvastatin  (LIPITOR) 40 MG tablet  Has the patient contacted their pharmacy? Yes (Agent: If no, request that the patient contact the pharmacy for the refill. If patient does not wish to contact the pharmacy document the reason why and proceed with request.) (Agent: If yes, when and what did the pharmacy advise?)  This is the patient's preferred pharmacy:  [id number] - [place], [place] - [address] [place] [address] Phone: [phone] Fax: [phone]  Is this the correct pharmacy for this prescription? Yes If no, delete pharmacy and type the correct one.   Has the prescription been filled recently? No  Is the patient out of the medication? Yes  Has the patient been seen for an appointment in the last year OR does the patient have an upcoming appointment? Yes  Can we respond through MyChart? No  Agent: Please be advised that Rx refills may take up to 3 business days. We ask that you follow-up with your pharmacy.

## 2023-10-19 NOTE — Telephone Encounter (Signed)
 Rx sent.

## 2023-10-19 NOTE — Telephone Encounter (Signed)
 Called lvm for verbal order for PT eval and treatment.

## 2023-10-19 NOTE — Telephone Encounter (Signed)
 Copied from CRM [id number]. Topic: Clinical - Home Health Verbal Orders >> Oct 19, 2023  1:30 PM Martinique E wrote: Caller/Agency: Redell, PT with [facility] Callback Number: [phone] (secure line) Service Requested: Physical Therapy Frequency: 1x/week for 9 weeks Any new concerns about the patient? No  Returned call back for Redell  Lvm @ verbal order for PT for Shmiel, ask them to call our office back if they had any concerns. Communication  Caller/Agency: Redell, PT with [facility]    Callback Number: [phone] (secure line)    Service Requested: Physical Therapy    Frequency: 1x/week for 9 week

## 2023-11-16 ENCOUNTER — Telehealth: Payer: Self-pay

## 2023-11-16 NOTE — Telephone Encounter (Signed)
 Copied from CRM [id number]. Topic: General - Other >> Nov 16, 2023  1:39 PM Mercedes MATSU wrote: Reason for CRM: Patients niece Darnella Khan called in stating that the Medicare office will be faxing over a FF2 for Dr. Frann to fill out so the patient can continue Medicare services. Patients nieces is requesting call back and can be reached at  [phone]

## 2023-11-16 NOTE — Telephone Encounter (Signed)
 Called pt nieces lvm letting her know we faxed the FF2 form back to [facility].

## 2023-12-05 ENCOUNTER — Encounter: Payer: Self-pay | Admitting: Family Medicine

## 2023-12-06 DIAGNOSIS — M79675 Pain in left toe(s): Secondary | ICD-10-CM | POA: Diagnosis not present

## 2023-12-06 DIAGNOSIS — B351 Tinea unguium: Secondary | ICD-10-CM | POA: Diagnosis not present

## 2023-12-06 DIAGNOSIS — L84 Corns and callosities: Secondary | ICD-10-CM | POA: Diagnosis not present

## 2023-12-06 DIAGNOSIS — E119 Type 2 diabetes mellitus without complications: Secondary | ICD-10-CM | POA: Diagnosis not present

## 2023-12-09 ENCOUNTER — Other Ambulatory Visit: Payer: Self-pay | Admitting: Family Medicine

## 2023-12-09 DIAGNOSIS — E1165 Type 2 diabetes mellitus with hyperglycemia: Secondary | ICD-10-CM

## 2023-12-13 DIAGNOSIS — Z23 Encounter for immunization: Secondary | ICD-10-CM | POA: Diagnosis not present

## 2023-12-14 ENCOUNTER — Ambulatory Visit (INDEPENDENT_AMBULATORY_CARE_PROVIDER_SITE_OTHER): Admitting: Family Medicine

## 2023-12-14 ENCOUNTER — Encounter: Payer: Self-pay | Admitting: Family Medicine

## 2023-12-14 VITALS — BP 128/72 | HR 100 | Temp 98.0°F | Resp 16 | Ht 71.0 in | Wt 208.0 lb

## 2023-12-14 DIAGNOSIS — R809 Proteinuria, unspecified: Secondary | ICD-10-CM | POA: Diagnosis not present

## 2023-12-14 DIAGNOSIS — Z794 Long term (current) use of insulin: Secondary | ICD-10-CM

## 2023-12-14 DIAGNOSIS — I10 Essential (primary) hypertension: Secondary | ICD-10-CM | POA: Diagnosis not present

## 2023-12-14 DIAGNOSIS — Z1211 Encounter for screening for malignant neoplasm of colon: Secondary | ICD-10-CM

## 2023-12-14 DIAGNOSIS — E1165 Type 2 diabetes mellitus with hyperglycemia: Secondary | ICD-10-CM | POA: Diagnosis not present

## 2023-12-14 MED ORDER — KETOCONAZOLE 2 % EX CREA: 1.0000 | TOPICAL_CREAM | Freq: Every day | CUTANEOUS | 0 refills | Status: AC

## 2023-12-14 NOTE — Patient Instructions (Addendum)
Give us 2-3 business days to get the results of your labs back.   Keep the diet clean and stay active.  If you do not hear anything about your referral in the next 1-2 weeks, call our office and ask for an update.  Let us know if you need anything. 

## 2023-12-14 NOTE — Progress Notes (Signed)
 Subjective:   Chief Complaint  Patient presents with   Diabetes    Diabetes Check    Phillip Richard is a 73 y.o. male here for follow-up of diabetes.   Garfield's self monitored glucose range is low-mid 100's.  Patient denies hypoglycemic reactions. He checks his glucose levels 1 time(s) per day. Patient does require insulin .  Novolog  70/30 16 u in AM and 8 u in PM. Medications include: metformin  1000 mg bid, Jardiance  10 mg/d Diet is healthy.  Exercise: walking, calisthenics No CP or SOB.  Hypertension Patient presents for hypertension follow up. He does not monitor home blood pressures. He is compliant with medications-lisinopril  10 mg daily, amlodipine  5 mg daily. Patient has these side effects of medication: none Diet/exercise as above.  Past Medical History:  Diagnosis Date   Allergic rhinitis    Allergy    Arthritis    shoulder   Cataract    both eyes - just watching now   Diabetes mellitus without complication (HCC)    History of chicken pox    History of lead poisoning    Hyperlipidemia    Hypertension    Mild cognitive impairment    Mumps    Uses walker    ambulates with walker     Related testing: Retinal exam: Done Pneumovax: done  Objective:  BP 128/72 (BP Location: Left Arm, Patient Position: Sitting)   Pulse 100   Temp 98 F (36.7 C) (Oral)   Resp 16   Ht 5' 11 (1.803 m)   Wt 208 lb (94.3 kg)   SpO2 96%   BMI 29.01 kg/m  General:  Well developed, well nourished, in no apparent distress Lungs:  CTAB, no access msc use Cardio:  RRR, no bruits, no LE edema Psych: Normal affect  Assessment:   Type 2 diabetes mellitus with hyperglycemia, with long-term current use of insulin  (HCC) - Plan: Comprehensive metabolic panel with GFR, HgB A1c, Lipid panel  Essential hypertension  Proteinuria, unspecified type - Plan: Urine Microalbumin w/creat. ratio  Screen for colon cancer - Plan: Ambulatory referral to Gastroenterology    Plan:    Chronic, stable.  Continue metformin  1000 mg twice daily, Jardiance  10 mg daily, NovoLog  70/30 16 units in the morning, 8 units in the evening.  Continue to monitor blood sugars at home.  Counseled on diet and exercise. Chronic, stable.  Continue lisinopril  10 mg daily, amlodipine  5 mg daily. Follow-up on proteinuria today.  If still elevated, will recheck again in 6 weeks and consider adding finerenone. Referred to the GI team again.  Will have him contact his sister. F/u in 3-6 mo. The patient voiced understanding and agreement to the plan.  Mabel Mt Gillespie, DO 12/14/23 3:41 PM

## 2023-12-15 ENCOUNTER — Other Ambulatory Visit: Payer: Self-pay

## 2023-12-15 ENCOUNTER — Ambulatory Visit: Payer: Self-pay | Admitting: Family Medicine

## 2023-12-15 LAB — LIPID PANEL
Cholesterol: 70 mg/dL (ref 0–200)
HDL: 29.6 mg/dL — ABNORMAL LOW (ref >39.00)
LDL Cholesterol: 16 mg/dL (ref 0–99)
NonHDL: 40.67
Total CHOL/HDL Ratio: 2
Triglycerides: 124 mg/dL (ref 0.0–149.0)
VLDL: 24.8 mg/dL (ref 0.0–40.0)

## 2023-12-15 LAB — COMPREHENSIVE METABOLIC PANEL WITH GFR
ALT: 19 U/L (ref 0–53)
AST: 17 U/L (ref 0–37)
Albumin: 4.3 g/dL (ref 3.5–5.2)
Alkaline Phosphatase: 69 U/L (ref 39–117)
BUN: 31 mg/dL — ABNORMAL HIGH (ref 6–23)
CO2: 24 meq/L (ref 19–32)
Calcium: 9.7 mg/dL (ref 8.4–10.5)
Chloride: 106 meq/L (ref 96–112)
Creatinine, Ser: 1.57 mg/dL — ABNORMAL HIGH (ref 0.40–1.50)
GFR: 42.88 mL/min — ABNORMAL LOW (ref >60.00)
Glucose, Bld: 141 mg/dL — ABNORMAL HIGH (ref 70–99)
Potassium: 5.4 meq/L — ABNORMAL HIGH (ref 3.5–5.1)
Sodium: 141 meq/L (ref 135–145)
Total Bilirubin: 0.4 mg/dL (ref 0.2–1.2)
Total Protein: 7.1 g/dL (ref 6.0–8.3)

## 2023-12-15 LAB — MICROALBUMIN / CREATININE URINE RATIO
Creatinine,U: 142.2 mg/dL
Microalb Creat Ratio: 93.3 mg/g — ABNORMAL HIGH (ref 0.0–30.0)
Microalb, Ur: 13.3 mg/dL — ABNORMAL HIGH (ref 0.0–1.9)

## 2023-12-15 LAB — HEMOGLOBIN A1C: Hgb A1c MFr Bld: 8.1 % — ABNORMAL HIGH (ref 4.6–6.5)

## 2023-12-15 MED ORDER — EMPAGLIFLOZIN 25 MG PO TABS
25.0000 mg | ORAL_TABLET | Freq: Every day | ORAL | 3 refills | Status: AC
Start: 2023-12-15 — End: ?

## 2023-12-19 ENCOUNTER — Other Ambulatory Visit: Payer: Self-pay

## 2023-12-19 DIAGNOSIS — E1165 Type 2 diabetes mellitus with hyperglycemia: Secondary | ICD-10-CM

## 2023-12-20 ENCOUNTER — Telehealth: Payer: Self-pay | Admitting: *Deleted

## 2023-12-20 ENCOUNTER — Other Ambulatory Visit: Payer: Self-pay

## 2023-12-20 NOTE — Telephone Encounter (Signed)
 Copied from CRM #[id number]. Topic: Clinical - Medical Advice >> Dec 20, 2023 11:59 AM Thersia BROCKS wrote: Reason for CRM: [facility] health wellness director from [place] called in stated they   need fax order for the change dosage of jardiance    [phone]

## 2023-12-20 NOTE — Telephone Encounter (Signed)
 Faxed letter with Jardiance  changes from 10 mg to 25 mg dairy, per Dr.Wendling.

## 2023-12-22 ENCOUNTER — Other Ambulatory Visit (INDEPENDENT_AMBULATORY_CARE_PROVIDER_SITE_OTHER)

## 2023-12-22 ENCOUNTER — Telehealth: Payer: Self-pay | Admitting: Family Medicine

## 2023-12-22 DIAGNOSIS — Z794 Long term (current) use of insulin: Secondary | ICD-10-CM | POA: Diagnosis not present

## 2023-12-22 DIAGNOSIS — E1165 Type 2 diabetes mellitus with hyperglycemia: Secondary | ICD-10-CM | POA: Diagnosis not present

## 2023-12-22 LAB — BASIC METABOLIC PANEL WITH GFR
BUN/Creatinine Ratio: 21 (calc) (ref 6–22)
BUN: 28 mg/dL — ABNORMAL HIGH (ref 7–25)
CO2: 24 mmol/L (ref 20–32)
Calcium: 10.1 mg/dL (ref 8.6–10.3)
Chloride: 105 mmol/L (ref 98–110)
Creat: 1.34 mg/dL — ABNORMAL HIGH (ref 0.70–1.28)
Glucose, Bld: 121 mg/dL — ABNORMAL HIGH (ref 65–99)
Potassium: 5.4 mmol/L — ABNORMAL HIGH (ref 3.5–5.3)
Sodium: 141 mmol/L (ref 135–146)
eGFR: 55 mL/min/1.73m2 — ABNORMAL LOW (ref >=60)

## 2023-12-22 NOTE — Telephone Encounter (Signed)
 Pt dropped off forms to be filled out by provider

## 2023-12-23 ENCOUNTER — Ambulatory Visit: Payer: Self-pay | Admitting: Family Medicine

## 2023-12-23 ENCOUNTER — Other Ambulatory Visit: Payer: Self-pay

## 2023-12-23 DIAGNOSIS — E876 Hypokalemia: Secondary | ICD-10-CM

## 2023-12-23 MED ORDER — LOKELMA 10 G PO PACK: 10.0000 g | PACK | Freq: Once | ORAL | 0 refills | Status: AC

## 2023-12-23 NOTE — Telephone Encounter (Signed)
Paperwork placed in providers bin for review.

## 2023-12-26 ENCOUNTER — Telehealth: Payer: Self-pay | Admitting: Family Medicine

## 2023-12-26 ENCOUNTER — Other Ambulatory Visit: Payer: Self-pay

## 2023-12-26 DIAGNOSIS — N401 Enlarged prostate with lower urinary tract symptoms: Secondary | ICD-10-CM

## 2023-12-26 MED ORDER — TAMSULOSIN HCL 0.4 MG PO CAPS: 0.4000 mg | ORAL_CAPSULE | Freq: Every day | ORAL | 0 refills | Status: AC

## 2023-12-26 MED ORDER — LISINOPRIL 10 MG PO TABS: 10.0000 mg | ORAL_TABLET | Freq: Every day | ORAL | 2 refills | Status: AC

## 2023-12-26 NOTE — Telephone Encounter (Signed)
 Patient needs refill on  lisinopril  (ZESTRIL ) 10 MG tablet  tamsulosin  (FLOMAX ) 0.4 MG CAPS capsule  Please send to pharmacy.

## 2023-12-26 NOTE — Telephone Encounter (Signed)
 Refill sent.

## 2023-12-29 ENCOUNTER — Other Ambulatory Visit (INDEPENDENT_AMBULATORY_CARE_PROVIDER_SITE_OTHER)

## 2023-12-29 ENCOUNTER — Telehealth: Payer: Self-pay | Admitting: Family Medicine

## 2023-12-29 DIAGNOSIS — E876 Hypokalemia: Secondary | ICD-10-CM

## 2023-12-29 LAB — BASIC METABOLIC PANEL WITH GFR
BUN/Creatinine Ratio: 21 (calc) (ref 6–22)
BUN: 30 mg/dL — ABNORMAL HIGH (ref 7–25)
CO2: 21 mmol/L (ref 20–32)
Calcium: 10 mg/dL (ref 8.6–10.3)
Chloride: 106 mmol/L (ref 98–110)
Creat: 1.4 mg/dL — ABNORMAL HIGH (ref 0.70–1.28)
Glucose, Bld: 147 mg/dL — ABNORMAL HIGH (ref 65–99)
Potassium: 5.1 mmol/L (ref 3.5–5.3)
Sodium: 142 mmol/L (ref 135–146)
eGFR: 52 mL/min/1.73m2 — ABNORMAL LOW (ref >=60)

## 2023-12-29 NOTE — Telephone Encounter (Signed)
 Pt dropped off papers for pcp. Placed in pcp's tray.

## 2023-12-30 ENCOUNTER — Ambulatory Visit: Payer: Self-pay | Admitting: Family Medicine

## 2023-12-30 NOTE — Telephone Encounter (Signed)
Paperwork placed

## 2024-01-02 ENCOUNTER — Telehealth: Payer: Self-pay | Admitting: Family Medicine

## 2024-01-02 NOTE — Telephone Encounter (Signed)
 Copied from CRM [id number]. Topic: Medicare AWV >> Jan 02, 2024 11:50 AM Nathanel DEL wrote: Reason for CRM: Called LVM 01/02/2024 to schedule AWV. Please schedule in office only DUE TO [employer]  Nathanel Paschal; Care Guide Ambulatory Clinical Support [place] [facility] Direct Dial: [phone]

## 2024-01-07 DIAGNOSIS — W19XXXA Unspecified fall, initial encounter: Secondary | ICD-10-CM | POA: Diagnosis not present

## 2024-01-07 DIAGNOSIS — R519 Headache, unspecified: Secondary | ICD-10-CM | POA: Diagnosis not present

## 2024-01-07 DIAGNOSIS — M542 Cervicalgia: Secondary | ICD-10-CM | POA: Diagnosis not present

## 2024-01-07 DIAGNOSIS — S0083XA Contusion of other part of head, initial encounter: Secondary | ICD-10-CM | POA: Diagnosis not present

## 2024-01-07 DIAGNOSIS — I959 Hypotension, unspecified: Secondary | ICD-10-CM | POA: Diagnosis not present

## 2024-01-09 ENCOUNTER — Telehealth: Payer: Self-pay | Admitting: *Deleted

## 2024-01-09 NOTE — Telephone Encounter (Signed)
 FYI  Contact Type Call Who Is Calling Patient / Member / Family / Caregiver Caller Name Cammie Caller Phone Number [phone] Call Type Message Only Information Provided Reason for Call Request for General Office Information Initial Comment Caller states she is calling from [facility] [place] that the patient had a fall in the bathroom and hit his head, has a cut on the right eyelid and beside the right eye, and the er discharged him after about 2 hrs. Translation No Disp. Time Titus Time) Disposition Final User 01/07/2024 11:38:59 PM General Information Provided Yes Adrien File Final Disposition 01/07/2024 11:38:59 PM General Information Provided Yes Adrien File

## 2024-01-30 ENCOUNTER — Other Ambulatory Visit: Payer: Self-pay

## 2024-01-30 DIAGNOSIS — E1165 Type 2 diabetes mellitus with hyperglycemia: Secondary | ICD-10-CM

## 2024-01-30 MED ORDER — METFORMIN HCL 500 MG PO TABS: 1000.0000 mg | ORAL_TABLET | Freq: Two times a day (BID) | ORAL | 0 refills | Status: AC

## 2024-02-17 ENCOUNTER — Ambulatory Visit: Payer: Self-pay

## 2024-02-17 NOTE — Telephone Encounter (Signed)
 FYI Only or Action Required?: FYI only for provider: ED advised.  Patient was last seen in primary care on 12/14/2023 by Frann Mabel Mt, DO.  Called Nurse Triage reporting Hematuria.  Symptoms began today.  Interventions attempted: Nothing.  Symptoms are: gradually worsening.  Triage Disposition: Go to ED Now (Notify PCP)  Patient/caregiver understands and will follow disposition?: Yes Reason for Disposition  Passing pure blood or large blood clots (i.e., size > a dime)  (Exception: Fleck or small strands.)  Answer Assessment - Initial Assessment Questions 1. COLOR of URINE: Describe the color of the urine.  (e.g., tea-colored, pink, red, bloody) Do you have blood clots in your urine? (e.g., none, pea, grape, small coin)     Urinated lot of blood per nurse since she left message. Patient is being sent to ER. Only other complaint is slight pain when urinating.  Protocols used: Urine - Blood In-A-AH

## 2024-02-20 NOTE — Telephone Encounter (Signed)
 Pt has appt today

## 2024-03-09 ENCOUNTER — Telehealth: Payer: Self-pay | Admitting: Family Medicine

## 2024-03-09 NOTE — Telephone Encounter (Signed)
 Copied from CRM [id number]. Topic: Medical Record Request - Other >> Mar 09, 2024  8:43 AM Robinson H wrote: Reason for CRM: Patients Niece states they are in the process of updating power of attorney paperwork for patient and wants to know if they can drop documents off so Dr. Frann can sign off on, patient is in rehabilitation right now and family will reschedule appointments when he is released.  [place] [phone]

## 2024-03-09 NOTE — Telephone Encounter (Signed)
 Called spoke with Pt niece and ok to drop off copy.

## 2024-03-12 ENCOUNTER — Ambulatory Visit: Admitting: Family Medicine

## 2024-03-20 ENCOUNTER — Ambulatory Visit: Admitting: Family Medicine

## 2024-03-29 ENCOUNTER — Telehealth: Payer: Self-pay | Admitting: *Deleted

## 2024-03-29 NOTE — Telephone Encounter (Signed)
 Copied from CRM #[id number]. Topic: Clinical - Medication Question >> Mar 29, 2024 12:03 PM Rea ORN wrote: Reason for CRM: Earnie with [facility] called to advise the pt has recently returned to them from a rehab. The rehab adjusted how the pt takes his insulin  on a sliding scale. Earnie would like clarification from PCP how how his insulin  should be administered now.   Please call back to advise and ask for Earnie  [phone]

## 2024-03-29 NOTE — Telephone Encounter (Signed)
 Tried calling the rehab place but no answer no vm to advised them that we have not seen him in a while.  The patient last saw you on 04/2023.  It looks like he was admitted to the hospital.   He sees you on the 04/04/24.  The note from [facility] was long but this is what I found.

## 2024-03-30 ENCOUNTER — Other Ambulatory Visit: Payer: Self-pay

## 2024-03-30 NOTE — Telephone Encounter (Signed)
 I rec following the discharge instructions until he gets in with us . Thx.

## 2024-03-30 NOTE — Telephone Encounter (Signed)
 Called spoke with [facility] about medication and was advised per Dr.Wendling. they stated  Understand. They needed a copy of medication list and sent med list.

## 2024-04-02 ENCOUNTER — Telehealth: Payer: Self-pay

## 2024-04-02 NOTE — Telephone Encounter (Signed)
 Facility Name [facility] Facility Number [id number] Reason for Call Report a patient fall Initial Comment Caller states patient had a fall. No injuries Translation No Disp. Time Titus Time) Disposition Final User 04/01/2024 6:40:54 AM Send to [facility] Paging Queue Vicci, Royal 04/01/2024 6:48:19 AM Paged On Call back to Call Center - PC Debarah No 04/01/2024 7:09:48 AM Paged On Call back to [facility] Kristin Army 04/01/2024 7:26:08 AM [facility] Provider [facility] 04/01/2024 7:26:46 AM Page Completed Yes [facility] Final Disposition 04/01/2024 7:26:46 AM Page Completed Yes [facility] Paging DoctorName Phone DateTime Result/ Outcome Message Type Notes Avelina No JASMINE MD [id number] 04/01/2024 6:48:19 AM Paged On Call Back to Call Center Doctor Paged Please call Amy w/ Access Nurse @ [phone]. Avelina No - MD [id number] 04/01/2024 7:09:48 AM Paged On Call Back to Call Center Doctor Paged Hello, this is Arian from the call center. Please give us  a call at your earlier conventience at [phone]. Avelina No - MD [id number] 04/01/2024 7:26:08 AM Called On Call Provider - Reached Doctor Paged PLEASE NOTE: All timestamps contained within this report are represented as Eastern Standard Time. CONFIDENTIALTY NOTICE: This fax transmission is intended only for the addressee. It contains information that is legally privileged, confidential or otherwise protected from use or disclosure. If you are not the intended recipient, you are strictly prohibited from reviewing, disclosing, copying using or disseminating any of this information or taking any action in reliance on or regarding this information. If you have received this fax in error, please notify us  immediately by telephone so that we can arrange for its return to us . Phone: [phone], Toll-Free: [phone], Fax: [phone] WILLIAM_O'NEIL Nov 03, 1950 Page: 1 of2 CallId:  [id number] Paging DoctorName Phone DateTime Result/ Outcome Message Type Notes Avelina No - MD 04/01/2024 7:26:24 AM Spoke with On Call - General Message Result Relayed info.

## 2024-04-04 ENCOUNTER — Inpatient Hospital Stay: Admitting: Family Medicine

## 2024-04-10 ENCOUNTER — Ambulatory Visit: Admitting: Family Medicine

## 2024-04-11 ENCOUNTER — Inpatient Hospital Stay: Admitting: Family Medicine
